# Patient Record
Sex: Female | Born: 1965 | Race: Black or African American | Hispanic: No | Marital: Single | Smoking: Never smoker
Health system: Southern US, Community
[De-identification: ages and names within clinical notes are randomized; demographics above are authoritative.]

## PROBLEM LIST (undated history)

## (undated) DIAGNOSIS — G43909 Migraine, unspecified, not intractable, without status migrainosus: Secondary | ICD-10-CM

## (undated) DIAGNOSIS — M7989 Other specified soft tissue disorders: Secondary | ICD-10-CM

## (undated) DIAGNOSIS — K625 Hemorrhage of anus and rectum: Secondary | ICD-10-CM

## (undated) DIAGNOSIS — R0602 Shortness of breath: Secondary | ICD-10-CM

## (undated) DIAGNOSIS — E559 Vitamin D deficiency, unspecified: Secondary | ICD-10-CM

## (undated) DIAGNOSIS — R12 Heartburn: Secondary | ICD-10-CM

## (undated) DIAGNOSIS — I10 Essential (primary) hypertension: Secondary | ICD-10-CM

## (undated) DIAGNOSIS — K760 Fatty (change of) liver, not elsewhere classified: Secondary | ICD-10-CM

## (undated) DIAGNOSIS — J45909 Unspecified asthma, uncomplicated: Secondary | ICD-10-CM

## (undated) DIAGNOSIS — E669 Obesity, unspecified: Secondary | ICD-10-CM

## (undated) DIAGNOSIS — R05 Cough: Secondary | ICD-10-CM

## (undated) DIAGNOSIS — M1990 Unspecified osteoarthritis, unspecified site: Secondary | ICD-10-CM

## (undated) DIAGNOSIS — R059 Cough, unspecified: Secondary | ICD-10-CM

## (undated) DIAGNOSIS — M2550 Pain in unspecified joint: Secondary | ICD-10-CM

## (undated) HISTORY — PX: EYE SURGERY: SHX253

## (undated) HISTORY — DX: Hemorrhage of anus and rectum: K62.5

## (undated) HISTORY — PX: TUBAL LIGATION: SHX77

## (undated) HISTORY — DX: Cough, unspecified: R05.9

## (undated) HISTORY — DX: Unspecified osteoarthritis, unspecified site: M19.90

## (undated) HISTORY — DX: Obesity, unspecified: E66.9

## (undated) HISTORY — PX: OTHER SURGICAL HISTORY: SHX169

## (undated) HISTORY — PX: CATARACT EXTRACTION: SUR2

## (undated) HISTORY — DX: Cough: R05

## (undated) HISTORY — DX: Heartburn: R12

## (undated) HISTORY — DX: Shortness of breath: R06.02

## (undated) HISTORY — DX: Unspecified asthma, uncomplicated: J45.909

## (undated) HISTORY — DX: Fatty (change of) liver, not elsewhere classified: K76.0

## (undated) HISTORY — DX: Other specified soft tissue disorders: M79.89

## (undated) HISTORY — DX: Pain in unspecified joint: M25.50

## (undated) HISTORY — DX: Migraine, unspecified, not intractable, without status migrainosus: G43.909

## (undated) HISTORY — DX: Vitamin D deficiency, unspecified: E55.9

---

## 1999-06-09 HISTORY — PX: OTHER SURGICAL HISTORY: SHX169

## 2000-06-08 DIAGNOSIS — G43909 Migraine, unspecified, not intractable, without status migrainosus: Secondary | ICD-10-CM

## 2000-06-08 HISTORY — DX: Migraine, unspecified, not intractable, without status migrainosus: G43.909

## 2000-06-08 HISTORY — PX: OTHER SURGICAL HISTORY: SHX169

## 2001-06-08 HISTORY — PX: TOTAL ABDOMINAL HYSTERECTOMY W/ BILATERAL SALPINGOOPHORECTOMY: SHX83

## 2001-06-08 HISTORY — PX: CHOLECYSTECTOMY: SHX55

## 2002-02-02 ENCOUNTER — Encounter: Payer: Self-pay | Admitting: General Surgery

## 2002-02-02 ENCOUNTER — Ambulatory Visit (HOSPITAL_COMMUNITY): Admission: RE | Admit: 2002-02-02 | Discharge: 2002-02-02 | Payer: Self-pay | Admitting: General Surgery

## 2002-02-28 ENCOUNTER — Inpatient Hospital Stay (HOSPITAL_COMMUNITY): Admission: RE | Admit: 2002-02-28 | Discharge: 2002-03-04 | Payer: Self-pay | Admitting: General Surgery

## 2002-04-08 ENCOUNTER — Inpatient Hospital Stay (HOSPITAL_COMMUNITY): Admission: RE | Admit: 2002-04-08 | Discharge: 2002-04-11 | Payer: Self-pay | Admitting: General Surgery

## 2002-04-09 ENCOUNTER — Encounter: Payer: Self-pay | Admitting: General Surgery

## 2003-03-28 ENCOUNTER — Ambulatory Visit (HOSPITAL_COMMUNITY): Admission: RE | Admit: 2003-03-28 | Discharge: 2003-03-28 | Payer: Self-pay | Admitting: Family Medicine

## 2003-03-28 ENCOUNTER — Encounter: Payer: Self-pay | Admitting: Family Medicine

## 2003-07-30 ENCOUNTER — Encounter (INDEPENDENT_AMBULATORY_CARE_PROVIDER_SITE_OTHER): Payer: Self-pay | Admitting: *Deleted

## 2003-07-30 ENCOUNTER — Encounter: Admission: RE | Admit: 2003-07-30 | Discharge: 2003-07-30 | Payer: Self-pay | Admitting: General Surgery

## 2004-05-14 ENCOUNTER — Ambulatory Visit: Payer: Self-pay | Admitting: Family Medicine

## 2005-01-28 ENCOUNTER — Ambulatory Visit: Payer: Self-pay | Admitting: Family Medicine

## 2005-05-14 ENCOUNTER — Ambulatory Visit: Payer: Self-pay | Admitting: Family Medicine

## 2005-06-02 ENCOUNTER — Ambulatory Visit: Payer: Self-pay | Admitting: Family Medicine

## 2005-08-19 ENCOUNTER — Emergency Department (HOSPITAL_COMMUNITY): Admission: EM | Admit: 2005-08-19 | Discharge: 2005-08-19 | Payer: Self-pay | Admitting: Emergency Medicine

## 2005-08-20 ENCOUNTER — Ambulatory Visit: Payer: Self-pay | Admitting: Family Medicine

## 2005-11-10 ENCOUNTER — Ambulatory Visit: Payer: Self-pay | Admitting: Family Medicine

## 2006-07-13 ENCOUNTER — Ambulatory Visit: Payer: Self-pay | Admitting: Family Medicine

## 2006-08-05 ENCOUNTER — Encounter: Admission: RE | Admit: 2006-08-05 | Discharge: 2006-08-05 | Payer: Self-pay | Admitting: Family Medicine

## 2006-08-23 ENCOUNTER — Encounter: Payer: Self-pay | Admitting: Family Medicine

## 2006-08-23 LAB — CONVERTED CEMR LAB
BUN: 12 mg/dL (ref 6–23)
Basophils Absolute: 0 10*3/uL (ref 0.0–0.1)
Basophils Relative: 0 % (ref 0–1)
CO2: 25 meq/L (ref 19–32)
Calcium: 8.8 mg/dL (ref 8.4–10.5)
Chloride: 105 meq/L (ref 96–112)
Cholesterol: 160 mg/dL (ref 0–200)
Creatinine, Ser: 0.78 mg/dL (ref 0.40–1.20)
Eosinophils Absolute: 0.1 10*3/uL (ref 0.0–0.7)
Eosinophils Relative: 1 % (ref 0–5)
Glucose, Bld: 82 mg/dL (ref 70–99)
HCT: 38.8 % (ref 36.0–46.0)
HDL: 44 mg/dL (ref >39)
Hemoglobin: 11.3 g/dL — ABNORMAL LOW (ref 12.0–15.0)
LDL Cholesterol: 101 mg/dL — ABNORMAL HIGH (ref 0–99)
Lymphocytes Relative: 43 % (ref 12–46)
Lymphs Abs: 3.2 10*3/uL (ref 0.7–3.3)
MCHC: 29.1 g/dL — ABNORMAL LOW (ref 30.0–36.0)
MCV: 79.2 fL (ref 78.0–100.0)
Monocytes Absolute: 0.5 10*3/uL (ref 0.2–0.7)
Monocytes Relative: 7 % (ref 3–11)
Neutro Abs: 3.7 10*3/uL (ref 1.7–7.7)
Neutrophils Relative %: 50 % (ref 43–77)
Platelets: 347 10*3/uL (ref 150–400)
Potassium: 3.9 meq/L (ref 3.5–5.3)
RBC: 4.9 M/uL (ref 3.87–5.11)
RDW: 14.6 % — ABNORMAL HIGH (ref 11.5–14.0)
Sodium: 139 meq/L (ref 135–145)
TSH: 1.49 microintl units/mL (ref 0.350–5.50)
Total CHOL/HDL Ratio: 3.6
Triglycerides: 77 mg/dL (ref <150)
VLDL: 15 mg/dL (ref 0–40)
WBC: 7.4 10*3/uL (ref 4.0–10.5)

## 2006-08-26 ENCOUNTER — Ambulatory Visit: Payer: Self-pay | Admitting: Family Medicine

## 2006-08-26 ENCOUNTER — Other Ambulatory Visit: Admission: RE | Admit: 2006-08-26 | Discharge: 2006-08-26 | Payer: Self-pay | Admitting: Family Medicine

## 2006-08-26 ENCOUNTER — Encounter (INDEPENDENT_AMBULATORY_CARE_PROVIDER_SITE_OTHER): Payer: Self-pay | Admitting: *Deleted

## 2006-08-26 LAB — CONVERTED CEMR LAB
Pap Smear: NORMAL
Pap Smear: NORMAL

## 2006-08-27 ENCOUNTER — Encounter: Payer: Self-pay | Admitting: Family Medicine

## 2006-08-27 LAB — CONVERTED CEMR LAB
Candida species: NEGATIVE
Chlamydia, DNA Probe: NEGATIVE
GC Probe Amp, Genital: NEGATIVE
Gardnerella vaginalis: NEGATIVE
Trichomonal Vaginitis: NEGATIVE

## 2006-09-08 ENCOUNTER — Ambulatory Visit: Payer: Self-pay | Admitting: Family Medicine

## 2006-11-22 ENCOUNTER — Ambulatory Visit: Payer: Self-pay | Admitting: Family Medicine

## 2007-01-11 ENCOUNTER — Ambulatory Visit: Payer: Self-pay | Admitting: Family Medicine

## 2007-05-17 ENCOUNTER — Ambulatory Visit: Payer: Self-pay | Admitting: Family Medicine

## 2007-05-18 ENCOUNTER — Ambulatory Visit (HOSPITAL_COMMUNITY): Admission: RE | Admit: 2007-05-18 | Discharge: 2007-05-18 | Payer: Self-pay | Admitting: Family Medicine

## 2007-06-24 ENCOUNTER — Ambulatory Visit: Payer: Self-pay | Admitting: Family Medicine

## 2007-08-16 ENCOUNTER — Encounter: Admission: RE | Admit: 2007-08-16 | Discharge: 2007-08-16 | Payer: Self-pay | Admitting: Family Medicine

## 2007-09-12 ENCOUNTER — Encounter (INDEPENDENT_AMBULATORY_CARE_PROVIDER_SITE_OTHER): Payer: Self-pay | Admitting: *Deleted

## 2007-09-13 DIAGNOSIS — Z8719 Personal history of other diseases of the digestive system: Secondary | ICD-10-CM | POA: Insufficient documentation

## 2007-09-13 DIAGNOSIS — G43909 Migraine, unspecified, not intractable, without status migrainosus: Secondary | ICD-10-CM | POA: Insufficient documentation

## 2007-10-26 ENCOUNTER — Ambulatory Visit: Payer: Self-pay | Admitting: Family Medicine

## 2007-10-26 ENCOUNTER — Other Ambulatory Visit: Admission: RE | Admit: 2007-10-26 | Discharge: 2007-10-26 | Payer: Self-pay | Admitting: Family Medicine

## 2008-01-06 ENCOUNTER — Encounter: Payer: Self-pay | Admitting: Family Medicine

## 2008-01-06 ENCOUNTER — Ambulatory Visit: Payer: Self-pay | Admitting: Family Medicine

## 2008-01-06 DIAGNOSIS — M25519 Pain in unspecified shoulder: Secondary | ICD-10-CM | POA: Insufficient documentation

## 2008-01-10 ENCOUNTER — Ambulatory Visit (HOSPITAL_COMMUNITY): Admission: RE | Admit: 2008-01-10 | Discharge: 2008-01-10 | Payer: Self-pay | Admitting: Family Medicine

## 2008-01-20 ENCOUNTER — Telehealth: Payer: Self-pay | Admitting: Family Medicine

## 2008-05-10 ENCOUNTER — Ambulatory Visit: Payer: Self-pay | Admitting: Family Medicine

## 2008-05-10 LAB — CONVERTED CEMR LAB
Bilirubin Urine: NEGATIVE
Glucose, Urine, Semiquant: NEGATIVE
Ketones, urine, test strip: NEGATIVE
Nitrite: NEGATIVE
Protein, U semiquant: NEGATIVE
Specific Gravity, Urine: 1.025
Urobilinogen, UA: 0.2
WBC Urine, dipstick: NEGATIVE
pH: 6

## 2008-05-11 ENCOUNTER — Telehealth: Payer: Self-pay | Admitting: Family Medicine

## 2008-08-31 ENCOUNTER — Encounter: Payer: Self-pay | Admitting: Family Medicine

## 2008-10-15 ENCOUNTER — Ambulatory Visit (HOSPITAL_COMMUNITY): Admission: RE | Admit: 2008-10-15 | Discharge: 2008-10-15 | Payer: Self-pay | Admitting: Family Medicine

## 2008-10-15 ENCOUNTER — Encounter (INDEPENDENT_AMBULATORY_CARE_PROVIDER_SITE_OTHER): Payer: Self-pay

## 2008-10-15 ENCOUNTER — Ambulatory Visit: Payer: Self-pay | Admitting: Family Medicine

## 2008-10-15 DIAGNOSIS — R51 Headache: Secondary | ICD-10-CM | POA: Insufficient documentation

## 2008-10-15 DIAGNOSIS — R519 Headache, unspecified: Secondary | ICD-10-CM | POA: Insufficient documentation

## 2008-10-17 ENCOUNTER — Encounter: Payer: Self-pay | Admitting: Family Medicine

## 2009-02-28 ENCOUNTER — Encounter: Admission: RE | Admit: 2009-02-28 | Discharge: 2009-02-28 | Payer: Self-pay | Admitting: Family Medicine

## 2009-02-28 ENCOUNTER — Ambulatory Visit: Payer: Self-pay | Admitting: Family Medicine

## 2009-02-28 DIAGNOSIS — R0609 Other forms of dyspnea: Secondary | ICD-10-CM | POA: Insufficient documentation

## 2009-02-28 DIAGNOSIS — R0989 Other specified symptoms and signs involving the circulatory and respiratory systems: Secondary | ICD-10-CM | POA: Insufficient documentation

## 2009-03-07 ENCOUNTER — Ambulatory Visit (HOSPITAL_COMMUNITY): Admission: RE | Admit: 2009-03-07 | Discharge: 2009-03-07 | Payer: Self-pay | Admitting: Family Medicine

## 2009-03-07 ENCOUNTER — Encounter: Payer: Self-pay | Admitting: Family Medicine

## 2009-03-09 ENCOUNTER — Encounter: Payer: Self-pay | Admitting: Family Medicine

## 2009-03-11 ENCOUNTER — Encounter: Payer: Self-pay | Admitting: Family Medicine

## 2009-03-11 LAB — CONVERTED CEMR LAB: Retic Ct Pct: 0.9 % (ref 0.4–3.1)

## 2009-03-12 LAB — CONVERTED CEMR LAB
BUN: 12 mg/dL (ref 6–23)
Basophils Absolute: 0 10*3/uL (ref 0.0–0.1)
Basophils Relative: 0 % (ref 0–1)
CO2: 24 meq/L (ref 19–32)
Calcium: 8.9 mg/dL (ref 8.4–10.5)
Chloride: 104 meq/L (ref 96–112)
Cholesterol: 169 mg/dL (ref 0–200)
Creatinine, Ser: 0.75 mg/dL (ref 0.40–1.20)
Eosinophils Absolute: 0 10*3/uL (ref 0.0–0.7)
Eosinophils Relative: 1 % (ref 0–5)
Glucose, Bld: 81 mg/dL (ref 70–99)
HCT: 36.9 % (ref 36.0–46.0)
HDL: 46 mg/dL (ref >39)
Hemoglobin: 11.1 g/dL — ABNORMAL LOW (ref 12.0–15.0)
LDL Cholesterol: 105 mg/dL — ABNORMAL HIGH (ref 0–99)
Lymphocytes Relative: 43 % (ref 12–46)
Lymphs Abs: 2.4 10*3/uL (ref 0.7–4.0)
MCHC: 30.1 g/dL (ref 30.0–36.0)
MCV: 75.2 fL — ABNORMAL LOW (ref 78.0–100.0)
Monocytes Absolute: 0.5 10*3/uL (ref 0.1–1.0)
Monocytes Relative: 9 % (ref 3–12)
Neutro Abs: 2.6 10*3/uL (ref 1.7–7.7)
Neutrophils Relative %: 47 % (ref 43–77)
Platelets: 360 10*3/uL (ref 150–400)
Potassium: 4.1 meq/L (ref 3.5–5.3)
RBC: 4.91 M/uL (ref 3.87–5.11)
RDW: 14.3 % (ref 11.5–15.5)
Sodium: 139 meq/L (ref 135–145)
TSH: 1.02 microintl units/mL (ref 0.350–4.500)
Total CHOL/HDL Ratio: 3.7
Triglycerides: 88 mg/dL (ref <150)
VLDL: 18 mg/dL (ref 0–40)
WBC: 5.6 10*3/uL (ref 4.0–10.5)

## 2009-04-11 ENCOUNTER — Encounter: Payer: Self-pay | Admitting: Family Medicine

## 2009-04-22 ENCOUNTER — Encounter: Payer: Self-pay | Admitting: Family Medicine

## 2009-04-26 ENCOUNTER — Telehealth: Payer: Self-pay | Admitting: Family Medicine

## 2009-04-30 ENCOUNTER — Ambulatory Visit: Payer: Self-pay | Admitting: Family Medicine

## 2009-04-30 DIAGNOSIS — R112 Nausea with vomiting, unspecified: Secondary | ICD-10-CM | POA: Insufficient documentation

## 2009-09-02 ENCOUNTER — Ambulatory Visit: Payer: Self-pay | Admitting: Family Medicine

## 2009-09-02 ENCOUNTER — Encounter (INDEPENDENT_AMBULATORY_CARE_PROVIDER_SITE_OTHER): Payer: Self-pay

## 2009-11-21 ENCOUNTER — Ambulatory Visit: Payer: Self-pay | Admitting: Family Medicine

## 2009-11-29 DIAGNOSIS — M5410 Radiculopathy, site unspecified: Secondary | ICD-10-CM | POA: Insufficient documentation

## 2009-11-29 DIAGNOSIS — M549 Dorsalgia, unspecified: Secondary | ICD-10-CM

## 2010-03-13 ENCOUNTER — Ambulatory Visit: Payer: Self-pay | Admitting: Family Medicine

## 2010-03-13 ENCOUNTER — Encounter (INDEPENDENT_AMBULATORY_CARE_PROVIDER_SITE_OTHER): Payer: Self-pay

## 2010-05-20 ENCOUNTER — Encounter: Payer: Self-pay | Admitting: Family Medicine

## 2010-05-29 ENCOUNTER — Other Ambulatory Visit
Admission: RE | Admit: 2010-05-29 | Discharge: 2010-05-29 | Payer: Self-pay | Source: Home / Self Care | Admitting: Family Medicine

## 2010-05-29 ENCOUNTER — Ambulatory Visit: Payer: Self-pay | Admitting: Family Medicine

## 2010-05-29 LAB — CONVERTED CEMR LAB: OCCULT 1: NEGATIVE

## 2010-05-30 ENCOUNTER — Encounter: Payer: Self-pay | Admitting: Family Medicine

## 2010-06-02 DIAGNOSIS — S139XXA Sprain of joints and ligaments of unspecified parts of neck, initial encounter: Secondary | ICD-10-CM | POA: Insufficient documentation

## 2010-06-02 DIAGNOSIS — R062 Wheezing: Secondary | ICD-10-CM | POA: Insufficient documentation

## 2010-06-05 ENCOUNTER — Encounter: Payer: Self-pay | Admitting: Family Medicine

## 2010-06-05 LAB — CONVERTED CEMR LAB: Pap Smear: NEGATIVE

## 2010-06-06 LAB — HM MAMMOGRAPHY

## 2010-06-06 LAB — HM PAP SMEAR

## 2010-06-16 ENCOUNTER — Encounter: Payer: Self-pay | Admitting: Family Medicine

## 2010-06-23 ENCOUNTER — Telehealth: Payer: Self-pay | Admitting: Family Medicine

## 2010-06-23 ENCOUNTER — Encounter: Payer: Self-pay | Admitting: Family Medicine

## 2010-06-24 ENCOUNTER — Ambulatory Visit
Admission: RE | Admit: 2010-06-24 | Discharge: 2010-06-24 | Payer: Self-pay | Source: Home / Self Care | Attending: Family Medicine | Admitting: Family Medicine

## 2010-06-29 ENCOUNTER — Encounter: Payer: Self-pay | Admitting: Family Medicine

## 2010-06-29 ENCOUNTER — Encounter: Payer: Self-pay | Admitting: Neurology

## 2010-07-09 ENCOUNTER — Encounter: Payer: Self-pay | Admitting: Family Medicine

## 2010-07-10 NOTE — Letter (Signed)
Summary: Work Excuse  [facility]  [address]   [place], [place] [address]   Phone: [phone]  Fax: [phone]    Today's Date: November 21, 2009  Name of Patient: Shannon Roth  The above named patient had a medical visit today.  Please take this into consideration when reviewing the time away from work/school.    Special Instructions:  [ * ] None  [  ] To be off the remainder of today, returning to the normal work / school schedule tomorrow.  [  ] To be off until the next scheduled appointment on ______________________.  [  ] Other ________________________________________________________________ ________________________________________________________________________   Sincerely yours,   Syliva Overman, MD

## 2010-07-10 NOTE — Letter (Signed)
Summary: [facility]  [facility]   Imported By: Lind Guest 05/30/2010 11:20:45  _____________________________________________________________________  External Attachment:    Type:   Image     Comment:   External Document

## 2010-07-10 NOTE — Progress Notes (Signed)
Summary: TEST RESULTS  Phone Note Call from Patient   Summary of Call: WANTS TO KNOW RESULTS FROM TESTS CALL HER BACK Initial call taken by: Lind Guest,  January 20, 2008 9:31 AM  Follow-up for Phone Call        left messege for pt call back.  will let pt  know  ultrasound and xray are both normal at that time Follow-up by: Calvert Cantor,  January 20, 2008 2:15 PM  Additional Follow-up for Phone Call Additional follow up Details #1::        noted Additional Follow-up by: Syliva Overman MD,  January 24, 2008 8:20 AM

## 2010-07-10 NOTE — Progress Notes (Signed)
Summary: DR. Ninetta Lights  DR. Ninetta Lights   Imported By: Lind Guest 03/01/2009 11:16:24  _____________________________________________________________________  External Attachment:    Type:   Image     Comment:   External Document

## 2010-07-10 NOTE — Assessment & Plan Note (Signed)
Vital Signs:  Patient Profile:   45 Years Old Female Height:     67.25 inches Weight:      303.7 pounds BMI:     47.38 Pulse rate:   72 / minute Resp:     16 per minute BP sitting:   136 / 78  (right arm)  Pt. in pain?   no  Vitals Entered By: Chipper Herb (January 06, 2008 8:45 AM)                  Chief Complaint:  c/o left shoulder "movement" X 3 weeks.  History of Present Illness: Patient presents with a 3 week h/o of a sensation of something moving in her L shoulder like a worm or some other foreign object. There is no h/o insect bite, warmth or tenderness in the affected area.She has had no fever or chills. The patient does report stress on the job, but denies insomnia, suicidal or homicidal ideation.    Current Allergies: ! PCN     Review of Systems  ENT      Denies hoarseness, nasal congestion, sinus pressure, and sore throat.  CV      Denies chest pain or discomfort, near fainting, palpitations, shortness of breath with exertion, and swelling of feet.  Resp      Complains of cough.      Denies shortness of breath, sputum productive, and wheezing.  GI      Denies abdominal pain, constipation, diarrhea, nausea, and vomiting.  GU      Denies dysuria, incontinence, and urinary frequency.  MS      Denies joint pain and stiffness.   Physical Exam  General:     overweight-appearing.   Head:     Normocephalic and atraumatic without obvious abnormalities. No apparent alopecia or balding. Eyes:     vision grossly intact.   Ears:     R ear normal and L ear normal.   Nose:     External nasal examination shows no deformity or inflammation. Nasal mucosa are pink and moist without lesions or exudates. Mouth:     Oral mucosa and oropharynx without lesions or exudates.  Teeth in good repair. Neck:     No deformities, masses, or tenderness noted. Chest Wall:     No deformities, masses, or tenderness noted. Lungs:     Normal respiratory effort,  chest expands symmetrically. Lungs are clear to auscultation, no crackles or wheezes. Heart:     Normal rate and regular rhythm. S1 and S2 normal without gallop, murmur, click, rub or other extra sounds. Abdomen:     soft, non-tender, and normal bowel sounds.   Msk:     No deformity or scoliosis noted of thoracic or lumbar spine.   Neurologic:     alert & oriented X3, cranial nerves II-XII intact, strength normal in all extremities, and sensation intact to light touch.   Skin:     Intact without suspicious lesions or rashes No palpable mass on L shoulder. Psych:     Cognition and judgment appear intact. Alert and cooperative with normal attention span and concentration. No apparent delusions, illusions, hallucinations    Impression & Recommendations:  Problem # 1:  MIGRAINE HEADACHE (ICD-346.90) Assessment: Improved  Problem # 2:  OBESITY, UNSPECIFIED (ICD-278.00) Assessment: Improved  Problem # 3:  SHOULDER PAIN, LEFT (ICD-719.41)  Her updated medication list for this problem includes:    Flexeril 10 Mg Tabs (Cyclobenzaprine hcl) ..... One half to one  tab by mouth three times a day as needed  Patient to have [place] of L shoulder to eval. abn . sensation and R/O foreign body.   Problem # 4:  COUGH (ICD-786.2) Assessment: Unchanged Patient to have CXR and a trial of symbicort.  Complete Medication List: 1)  Flexeril 10 Mg Tabs (Cyclobenzaprine hcl) .... One half to one tab by mouth three times a day as needed 2)  Topamax 200 Mg Tabs (Topiramate) .... One tab by mouth at bedtime 3)  Symbicort 80-4.5 Mcg/act Aero (Budesonide-formoterol fumarate) .... Inhale two puffs twice daily   Patient Instructions: 1)  Please schedule a follow-up appointment in 2 months. 2)  It is important that you exercise regularly at least 20 minutes 5 times a week. If you develop chest pain, have severe difficulty breathing, or feel very tired , stop exercising immediately and seek medical attention.  3)  You need to lose weight. Consider a lower calorie diet and regular exercise.  4)  Please STOP EATING  SNACKS, CAKES, PIES AND ICECREAM. YOU WANT TO WEAR YOUR NICE CLOTHES. 5)  YOu WILL START AN INHALER SYMBICORT 2 PUFFS TWICE DAILY FOR YOUR COUGH. 6)  You will have a CXR and an [place] of R shoulder for your symptoms. 7)  Medically cleared to return to work; return to work note provided.   Prescriptions: SYMBICORT 80-4.5 MCG/ACT  AERO (BUDESONIDE-FORMOTEROL FUMARATE) Inhale two puffs twice daily  #1 mth x 3   Entered by:   Chipper Herb   Authorized by:   Syliva Overman MD   Signed by:   Chipper Herb on 01/06/2008   Method used:   Handwritten   RxID:   [id number]  ]

## 2010-07-10 NOTE — Progress Notes (Signed)
Summary: RX  Phone Note Call from Patient   Summary of Call: RX WAS NOT SENT [facility] [place] THEY HAVE NOT RECIEVED ANYTHING Initial call taken by: Lind Guest,  May 11, 2008 11:20 AM  Follow-up for Phone Call        Phone Call Completed, Rx Called In Follow-up by: Worthy Keeler LPN,  May 11, 2008 2:37 PM

## 2010-07-10 NOTE — Letter (Signed)
Summary: Out of Work  [facility]  [address]   [place], [place] [address]   Phone: [phone]  Fax: [phone]    June 23, 2010   Employee:  ADRI SCHLOSS Milosevic    To Whom It May Concern:   For Medical reasons, please excuse the above named employee from work for the following dates:  Start:   06/24/10  End:   06/25/10 to return with no restrictions  If you need additional information, please feel free to contact our office.         Sincerely,    Milus Mallick. Lodema Hong, MD

## 2010-07-10 NOTE — Assessment & Plan Note (Signed)
Summary: per dr  Nurse Visit   Vital Signs:  Patient profile:   45 year old female Menstrual status:  hysterectomy Weight:      300 pounds BP sitting:   126 / 84  Vitals Entered By: Adella Hare LPN (June 24, 2010 11:43 AM) CC: migraine with nausea   Allergies: 1)  ! Pcn 2)  ! Codeine  Medication Administration  Injection # 1:    Medication: Depo- Medrol 80mg     Diagnosis: MIGRAINE HEADACHE (ICD-346.90)    Route: IM    Site: RUOQ gluteus    Exp Date: 07/12    Lot #: [id number]    Mfr: Pharmacia    Patient tolerated injection without complications    Given by: Adella Hare LPN (June 24, 2010 11:43 AM)  Injection # 2:    Medication: Ketorolac-Toradol 15mg     Diagnosis: MIGRAINE HEADACHE (ICD-346.90)    Route: IM    Site: RUOQ gluteus    Exp Date: 11/07/2011    Lot #: [id number]    Mfr: NOVAPLUS    Comments: TORADOL 60MG  GIVEN    Patient tolerated injection without complications    Given by: Adella Hare LPN (June 24, 2010 11:44 AM)  Injection # 3:    Medication: Zofran 1mg . injection    Diagnosis: NAUSEA (ICD-787.02)    Route: IM    Site: LUOQ gluteus    Exp Date: 04/13    Lot #: [id number]    Mfr: NOVAPLUS    Comments: ZOFRAN 4MG  GIVEN    Patient tolerated injection without complications    Given by: Adella Hare LPN (June 24, 2010 11:45 AM)  Orders Added: 1)  Depo- Medrol 80mg  [J1040] 2)  Ketorolac-Toradol 15mg  [J1885] 3)  Zofran 1mg . injection [J2405] 4)  Admin of Therapeutic Inj  intramuscular or subcutaneous [96372]  tolerated injectionand work excuse provided  Medication Administration  Injection # 1:    Medication: Depo- Medrol 80mg     Diagnosis: MIGRAINE HEADACHE (ICD-346.90)    Route: IM    Site: RUOQ gluteus    Exp Date: 07/12    Lot #: Gunnar [id number]    Mfr: Pharmacia    Patient tolerated injection without complications    Given by: Adella Hare LPN (June 24, 2010 11:43 AM)  Injection # 2:    Medication: Ketorolac-Toradol 15mg   Diagnosis: MIGRAINE HEADACHE (ICD-346.90)    Route: IM    Site: RUOQ gluteus    Exp Date: 11/07/2011    Lot #: [id number]    Mfr: NOVAPLUS    Comments: TORADOL 60MG  GIVEN    Patient tolerated injection without complications    Given by: Adella Hare LPN (June 24, 2010 11:44 AM)  Injection # 3:    Medication: Zofran 1mg . injection    Diagnosis: NAUSEA (ICD-787.02)    Route: IM    Site: LUOQ gluteus    Exp Date: 04/13    Lot #: [id number]    Mfr: NOVAPLUS    Comments: ZOFRAN 4MG  GIVEN    Patient tolerated injection without complications    Given by: Adella Hare LPN (June 24, 2010 11:45 AM)  Orders Added: 1)  Depo- Medrol 80mg  [J1040] 2)  Ketorolac-Toradol 15mg  [J1885] 3)  Zofran 1mg . injection [J2405] 4)  Admin of Therapeutic Inj  intramuscular or subcutaneous [30865]

## 2010-07-10 NOTE — Progress Notes (Signed)
[facility]   Imported By: Lind Guest 03/11/2009 11:34:11  _____________________________________________________________________  External Attachment:    Type:   Image     Comment:   External Document

## 2010-07-10 NOTE — Letter (Signed)
Summary: Out of Work  [facility]  [address]   [place], [place] [address]   Phone: [phone]  Fax: [phone]    March 13, 2010   Employee:  Lonia Mad Cheetham    To Whom It May Concern:   For Medical reasons, please excuse the above named employee from work for the following dates:  Start:   03/12/2010  End:   03/14/2010 To return with no restrictions  If you need additional information, please feel free to contact our office.         Sincerely,    Esperanza Sheets, PA-C

## 2010-07-10 NOTE — Letter (Signed)
Summary: Out of Work  [facility]  [address]   [place], [place] [address]   Phone: [phone]  Fax: [phone]    Oct 15, 2008   Employee:  Shannon Roth    To Whom It May Concern:   For Medical reasons, please excuse the above named employee from work for the following dates:  Start:   10/15/2008  End:   10/17/2008  If you need additional information, please feel free to contact our office.         Sincerely,    Everitt Amber

## 2010-07-10 NOTE — Assessment & Plan Note (Signed)
Summary: OV   Vital Signs:  Patient profile:   45 year old female Menstrual status:  hysterectomy Height:      67.5 inches (171.45 cm) Weight:      296 pounds (134.55 kg) BMI:     45.84 BSA:     2.40 O2 Sat:      98 % Pulse rate:   66 / minute Resp:     16 per minute BP sitting:   118 / 80  (left arm) Cuff size:   largex  Vitals Entered By: Everitt Amber (Oct 15, 2008 1:10 PM)  Nutrition Counseling: Patient's BMI is greater than 25 and therefore counseled on weight management options. CC: has a tremendous headache from hitting her head saturday. It began hurting later on after she hit it and the whole right side of her face appears to be swollen and she is experiencing alot of pressure int he area Pain Assessment Patient in pain? yes     Location: right side of head Intensity: 10 Type: throbbing Onset of pain  Saturday evening. Hurts to even touch head  years   days  Menstrual Status hysterectomy Last PAP Result Normal   CC:  has a tremendous headache from hitting her head saturday. It began hurting later on after she hit it and the whole right side of her face appears to be swollen and she is experiencing alot of pressure int he area.  History of Present Illness: headache , right facial swelling , inability to completely open right eye x 3days after direct trauma to crown of head. No respone  to typical meds. Rated at 10plus, only localised weakness is of the r eye, and moving her head hurts. Symptoms have progressively worsened and this is her first visit to the doc.This does not seem to be like her typical migraine.she denies fever , chills or neck stiffness, but movement of the head is painful. other than the right eye she has no localised weakness.She denies sensory deficit.  Current Medications (verified): 1)  Flexeril 10 Mg  Tabs (Cyclobenzaprine Hcl) .... One Half To One Tab By Mouth Three Times A Day As Needed 2)  Topamax 200 Mg  Tabs (Topiramate) .... One Tab By  Mouth At Bedtime 3)  Symbicort 80-4.5 Mcg/act  Aero (Budesonide-Formoterol Fumarate) .... Inhale Two Puffs Twice Daily  Allergies (verified): 1)  ! Pcn 2)  ! Codeine  Review of Systems General:  Complains of fatigue, sleep disorder, and weakness; denies chills and fever; unable to get adequate sleep since headache onset. ENT:  Denies hoarseness, nasal congestion, sinus pressure, and sore throat. CV:  Denies chest pain or discomfort, palpitations, shortness of breath with exertion, and swelling of feet. Resp:  Denies cough, sputum productive, and wheezing. GI:  Denies abdominal pain, constipation, diarrhea, nausea, and vomiting. GU:  Denies dysuria and urinary frequency. MS:  Denies joint pain and stiffness. Neuro:  See HPI; Complains of headaches. Psych:  Denies anxiety and depression.  Physical Exam  General:  obese female, tearful and in pain, well hydrated in no c/P distress. HEENT: No facial asymmetry,  EOMI, No sinus tenderness, TM's Clear, oropharynx  pink and moist. right maxillary area swollen and tender  Chest: Clear to auscultation bilaterally.  CVS: S1, S2, No murmurs, No S3.   Abd: Soft, Nontender.  MS: Adequate ROM spine, hips, shoulders and knees.  Ext: No edema.   CNS: CN 2-12 intact, power tone and sensation normal throughout.Weakness of right upper lid noted  Skin: Intact, no visible lesions or rashes.  Psych:memory intact.anxious , tearful, at one time patient was hollering loudly in the office stating she was in pain.   Impression & Recommendations:  Problem # 1:  HEADACHE (ICD-784.0) Assessment Comment Only  Orders: Radiology Referral (Radiology), pt was too claustrophobic to obtain the mRI ordered, so she had this changed to a cT scan which was negative for acute abnormality, she is referred for urgent heurological eval and work note proveded to be excused Neurology Referral (Neuro) Depo- Medrol 80mg  (J1040) Ketorolac-Toradol 15mg  (Z6109) Admin of  Therapeutic Inj  intramuscular or subcutaneous (60454)  Problem # 2:  OBESITY, UNSPECIFIED (ICD-278.00) Assessment: Deteriorated  Ht: 67.5 (10/15/2008)   Wt: 296 (10/15/2008)   BMI: 45.84 (10/15/2008)  Complete Medication List: 1)  Flexeril 10 Mg Tabs (Cyclobenzaprine hcl) .... One half to one tab by mouth three times a day as needed 2)  Topamax 200 Mg Tabs (Topiramate) .... One tab by mouth at bedtime 3)  Symbicort 80-4.5 Mcg/act Aero (Budesonide-formoterol fumarate) .... Inhale two puffs twice daily  Patient Instructions: 1)  You  need a brain scan to determine the cause of the headache and also a referral to a neurologist as soon as possible.It is very impt that you keep both of these referrals.  2)  If yoour scan shows no bleeding in your brain, you can receive 2 of your normal pain shots in the office this afternoon.   Medication Administration  Injection # 1:    Medication: Depo- Medrol 80mg     Diagnosis: HEADACHE (ICD-784.0)    Route: IM    Site: RUOQ gluteus    Exp Date: 12/2010    Lot #: [id number]    Mfr: [email or web address]    Comments: 80 mg given    Patient tolerated injection without complications    Given by: Everitt Amber (Oct 15, 2008 4:00 PM)  Injection # 2:    Medication: Ketorolac-Toradol 15mg     Diagnosis: HEADACHE (ICD-784.0)    Route: IM    Site: RUOQ gluteus    Exp Date: 07/2010    Lot #: [id number]    Mfr: Novaplus    Comments: 60 mg given    Patient tolerated injection without complications    Given by: Everitt Amber (Oct 15, 2008 4:01 PM)  Orders Added: 1)  Est. Patient Level III [09811] 2)  Radiology Referral [Radiology] 3)  Neurology Referral [Neuro] 4)  Depo- Medrol 80mg  [J1040] 5)  Ketorolac-Toradol 15mg  [J1885] 6)  Admin of Therapeutic Inj  intramuscular or subcutaneous [96372] 7)  Est. Patient Level IV [91478]   Appended Document: OV pls recode at a level 4 and remove level 3  Appended Document: OV this will be recoded

## 2010-07-10 NOTE — Letter (Signed)
Summary: Pap Smear, Normal Letter, [facility]  [address]   [place], [place] [address]   Phone: [phone]  Fax: [phone]          June 05, 2010    Dear: Shannon Roth    I am pleased to notify you that your PAP smear was normal.  You will need your next PAP smear in:     ____ 3 Months    ____ 6 Months    ____ 12 Months    Please call the office at our office number above, to schedule your next appointment.    Sincerely,     [facility]

## 2010-07-10 NOTE — Assessment & Plan Note (Signed)
Summary: HEADACHE   Vital Signs:  Patient profile:   45 year old female Menstrual status:  hysterectomy Height:      67.5 inches Weight:      307.25 pounds BMI:     47.58 O2 Sat:      97 % Pulse rate:   64 / minute Pulse rhythm:   regular Resp:     16 per minute BP sitting:   120 / 80  (left arm) Cuff size:   xl  Vitals Entered By: Everitt Amber LPN (September 02, 2009 11:21 AM)  Nutrition Counseling: Patient's BMI is greater than 25 and therefore counseled on weight management options. CC: having a migraine since saturday morning Pain Assessment Patient in pain? yes     Location: headache Intensity: 10 Type: pounding Onset of pain  saturday    Primary Care Provider:  Syliva Overman MD  CC:  having a migraine since saturday morning.  History of Present Illness: 3 day h/o frontal pounding headache which started after an arguement with one of her siblings. She staesthe pain is a 10, it is throbbing and accompanied by nausea.ShE nearly went to the Ed yesterday because of her symptoms, she has no triptans taking aT THIS TIME. tHIS IS THE FIRST HEADACHE SINCE dECEMBER. sHEDENIES FEVER CHILLS, SINUS PRESSURE,NASAL CONGESTION, SORE THROAT OR PRODUCTIVE COUGH. She reports frustration with her family often, staes her work situation has improved, and denies depression or anxiety. she has not maintained lifestyle changes o improve her health or promote weight loss.   Allergies: 1)  ! Pcn 2)  ! Codeine  Review of Systems      See HPI General:  Complains of fatigue; denies chills and fever. Eyes:  Denies blurring and discharge. CV:  Denies chest pain or discomfort, palpitations, and swelling of feet. GI:  Complains of nausea; denies abdominal pain, change in bowel habits, constipation, diarrhea, and vomiting. GU:  Denies dysuria and urinary frequency. MS:  Complains of joint pain. Neuro:  Complains of headaches; denies poor balance and seizures; pty has a 3 day h/o frontal  throbbing headache following a conflic with her sibling, shhe also has nausea. First headache since december, was goingto the ed because of uncontrolled pain and nause. Endo:  Denies excessive hunger, excessive thirst, and excessive urination. Heme:  Denies bleeding. Allergy:  Complains of seasonal allergies; denies hives or rash.  Physical Exam  General:  Well-developed,obedse no acute distress; alert,appropriate and cooperative throughout examination HEENT: No facial asymmetry,  EOMI, No sinus tenderness, TM's Clear, oropharynx  pink and moist.   Chest: Clear to auscultation bilaterally.  CVS: S1, S2, No murmurs, No S3.   Abd: Soft, Nontender.  MS: Adequate ROM spine, hips, shoulders and knees.  Ext: No edema.   CNS: CN 2-12 intact, power tone and sensation normal throughout.   Skin: Intact, no visible lesions or rashes.  Psych: Good eye contact, normal affect.  Memory intact, not anxious or depressed appearing.    Impression & Recommendations:  Problem # 1:  NAUSEA (ICD-787.02) Assessment Comment Only  Orders: Zofran 1mg . injection (J4782)  Problem # 2:  HEADACHE (ICD-784.0) Assessment: Deteriorated  Her updated medication list for this problem includes:    Ibuprofen 800 Mg Tabs (Ibuprofen) .Marland Kitchen... Take 1 tablet by mouth two times a day as needed for severe headache, maximum two daily max twice weekly    Fioricet 50-325-40 Mg Tabs (Butalbital-apap-caffeine) .Marland Kitchen... Take 1 tablet by mouth two times a day as needed  Maxalt-mlt 10 Mg Tbdp (Rizatriptan benzoate) ..... One at headache onset, may repeat every 2 hours if needed , max 3 tablets in 24 hrs, maximum use is twice weekly  Orders: Ketorolac-Toradol 15mg  (E4540) Depo- Medrol 80mg  (J1040) Admin of Therapeutic Inj  intramuscular or subcutaneous (98119)  Problem # 3:  OBESITY, UNSPECIFIED (ICD-278.00) Assessment: Deteriorated  Ht: 67.5 (09/02/2009)   Wt: 307.25 (09/02/2009)   BMI: 47.58 (09/02/2009)  Complete Medication  List: 1)  Flexeril 10 Mg Tabs (Cyclobenzaprine hcl) .... One half to one tab by mouth three times a day as needed 2)  Symbicort 80-4.5 Mcg/act Aero (Budesonide-formoterol fumarate) .... Inhale two puffs twice daily 3)  Ibuprofen 800 Mg Tabs (Ibuprofen) .... Take 1 tablet by mouth two times a day as needed for severe headache, maximum two daily max twice weekly 4)  Alprazolam 0.25 Mg Tabs (Alprazolam) .... Take 1 tab by mouth at bedtime 5)  Fioricet 50-325-40 Mg Tabs (Butalbital-apap-caffeine) .... Take 1 tablet by mouth two times a day as needed 6)  Maxalt-mlt 10 Mg Tbdp (Rizatriptan benzoate) .... One at headache onset, may repeat every 2 hours if needed , max 3 tablets in 24 hrs, maximum use is twice weekly 7)  Promethazine Hcl 25 Mg Tabs (Promethazine hcl) .... Take 1 tablet by mouth two times a day as needed  Patient Instructions: 1)  Please schedule a follow-up appointment in 2.5 months. 2)  It is important that you exercise regularly at least 20 minutes 5 times a week. If you develop chest pain, have severe difficulty breathing, or feel very tired , stop exercising immediately and seek medical attention. 3)  You need to lose weight. Consider a lower calorie diet and regular exercise.  Prescriptions: PROMETHAZINE HCL 25 MG TABS (PROMETHAZINE HCL) Take 1 tablet by mouth two times a day as needed  #20 x 0   Entered and Authorized by:   Syliva Overman MD   Signed by:   Syliva Overman MD on 09/02/2009   Method used:   Electronically to        [facility] (retail)       [address]       [place], [place]  [address]       Ph: [phone]       Fax: [phone]   RxID:   [id number] MAXALT-MLT 10 MG TBDP (RIZATRIPTAN BENZOATE) one at headache onset, may repeat every 2 hours if needed , max 3 tablets in 24 hrs, maximum use is twice weekly  #10 x 2   Entered and Authorized by:   Syliva Overman MD   Signed by:   Syliva Overman MD on 09/02/2009   Method  used:   Electronically to        [facility] (retail)       [address]       [place], [place]  [address]       Ph: [phone]       Fax: [phone]   RxID:   [id number]    Medication Administration  Injection # 1:    Medication: Ketorolac-Toradol 15mg     Diagnosis: HEADACHE (ICD-784.0)    Route: IM    Site: RUOQ gluteus    Exp Date: 01/2011    Lot #: [id number]    Mfr: [email or web address]    Comments: 60mg  given     Patient tolerated injection  without complications    Given by: Everitt Amber LPN (September 02, 2009 11:36 AM)  Injection # 2:    Medication: Depo- Medrol 80mg     Diagnosis: HEADACHE (ICD-784.0)    Route: IM    Site: LUOQ gluteus    Exp Date: 04/2010    Lot #: [id number]    Mfr: Pharmacia    Comments: 80mg  given     Patient tolerated injection without complications    Given by: Everitt Amber LPN (September 02, 2009 11:37 AM)  Injection # 5:    Medication: Zofran 1mg . injection    Diagnosis: NAUSEA (ICD-787.02)    Route: IM    Site: LUOQ gluteus    Exp Date: 12/2010    Lot #: [id number]    Mfr: [email or web address]    Comments: 4mg  given     Patient tolerated injection without complications    Given by: Everitt Amber LPN (September 02, 2009 11:37 AM)  Orders Added: 1)  Zofran 1mg . injection [J2405] 2)  Ketorolac-Toradol 15mg  [J1885] 3)  Depo- Medrol 80mg  [J1040] 4)  Admin of Therapeutic Inj  intramuscular or subcutaneous [96372] 5)  Est. Patient Level IV [29562]

## 2010-07-10 NOTE — Progress Notes (Signed)
Summary: HEADACHE  Phone Note Call from Patient   Summary of Call: HAS GOT A HEADACHE WANTS TO BE SEEN  SHE HAS TOOK EVERYTHING SHE HAS AND HAS NOT STOPPED YET  CALL BACK AT [phone] Initial call taken by: Lind Guest,  June 23, 2010 1:27 PM  Follow-up for Phone Call        nurse visit in am for injections pt aware, she will come in the morning at 8:30 am she is aware. Headache is her usual miograine will need a note work for tomorrow, headache has been going on since past 3 days Follow-up by: Syliva Overman MD,  June 23, 2010 5:04 PM  Additional Follow-up for Phone Call Additional follow up Details #1::        Pt to have nurse visit only in the morning , nurse pls administer  toradol 60mg  and depomedrol 80mg  im and zofran 4 mg IM for headache and nause in the morning , also needs work excuse for 01/17 to return 06/25/2010 Additional Follow-up by: Syliva Overman MD,  June 23, 2010 5:08 PM    Additional Follow-up for Phone Call Additional follow up Details #2::    noted Follow-up by: Adella Hare LPN,  June 23, 2010 5:12 PM

## 2010-07-10 NOTE — Assessment & Plan Note (Signed)
Summary: OV   Vital Signs:  Patient Profile:   45 Years Old Female Height:     67.25 inches Weight:      295 pounds BMI:     46.03 O2 Sat:      98 % Pulse rate:   69 / minute Resp:     16 per minute BP sitting:   110 / 80  (left arm)  Vitals Entered By: Everitt Amber (May 10, 2008 11:02 AM)                 Chief Complaint:  Follow up, coughing, congested, and took a zyrtec make her sick on stomach.  History of Present Illness: Head congestion, sore swollen throat and cough , chills progressing over the past 4 days. Shehas yellow sputum at times , she ios experiencing low back pain with urinary frequency  She reports disabling back pain and spasm over the past 5 days    Current Allergies: ! PCN ! CODEINE     Review of Systems  General      Complains of chills and fatigue.  ENT      Complains of nasal congestion, postnasal drainage, and sinus pressure.  CV      Denies chest pain or discomfort, palpitations, and swelling of feet.  Resp      Complains of cough, shortness of breath, and sputum productive.  GI      Denies abdominal pain, constipation, diarrhea, nausea, and vomiting.  MS      Denies joint pain and stiffness.  Derm      Denies changes in color of skin, changes in nail beds, dryness, excessive perspiration, flushing, hair loss, insect bite(s), itching, lesion(s), poor wound healing, and rash.  Psych      Denies anxiety and depression.  Endo      Denies cold intolerance, excessive hunger, excessive thirst, excessive urination, heat intolerance, polyuria, and weight change.  Heme      Denies abnormal bruising, bleeding, enlarge lymph nodes, fevers, pallor, and skin discoloration.   Physical Exam  General:     Morbidly obese and in no C/P disress. Head:     Normocephalic and atraumatic without obvious abnormalities. No apparent alopecia or balding.Positive maxillary frontal and maxillary sinus tenderness.  Eyes:     vision grossly  intact.   Ears:     External ear exam shows no significant lesions or deformities.  Otoscopic examination reveals clear canals, tympanic membranes are intact bilaterally without bulging, retraction, inflammation or discharge. Hearing is grossly normal bilaterally. Nose:     no external deformity and no nasal discharge.   Mouth:     fair dentition and teeth missing.   Neck:     No deformities, masses, or tenderness noted. Lungs:     decreased air entry, bilateral crackles Heart:     Normal rate and regular rhythm. S1 and S2 normal without gallop, murmur, click, rub or other extra sounds. Abdomen:     soft and non-tender.   Extremities:     No clubbing, cyanosis, edema, or deformity noted with normal full range of motion of all joints.   Neurologic:     alert & oriented X3, cranial nerves II-XII intact, and strength normal in all extremities.   Skin:     Intact without suspicious lesions or rashes Cervical Nodes:     B cervical adenitis Psych:     Oriented X3, memory intact for recent and remote, and normally interactive.  Impression & Recommendations:  Problem # 1:  ACUTE CYSTITIS (ICD-595.0) Assessment: Comment Only  Her updated medication list for this problem includes:    Septra Ds 800-160 Mg Tabs (Sulfamethoxazole-trimethoprim) .Marland Kitchen... Take 1 tablet by mouth two times a day  Orders: UA Dipstick W/ Micro (manual) (98119) negative, though pt is symptomatic  Encouraged to push clear liquids, get enough rest, and take acetaminophen as needed. To be seen in 10 days if no improvement, sooner if worse.  Orders: UA Dipstick W/ Micro (manual) ([address])   Problem # 2:  ACUTE BRONCHITIS (ICD-466.0) Assessment: Comment Only  Her updated medication list for this problem includes:    Symbicort 80-4.5 Mcg/act Aero (Budesonide-formoterol fumarate) ..... Inhale two puffs twice daily    Septra Ds 800-160 Mg Tabs (Sulfamethoxazole-trimethoprim) .Marland Kitchen... Take 1 tablet by mouth two times  a day    Tessalon Perles 100 Mg Caps (Benzonatate) .Marland Kitchen... Take 1 capsule by mouth three times a day   Problem # 3:  OTHER ACUTE SINUSITIS (ICD-461.8) Assessment: Comment Only  Her updated medication list for this problem includes:    Septra Ds 800-160 Mg Tabs (Sulfamethoxazole-trimethoprim) .Marland Kitchen... Take 1 tablet by mouth two times a day    Tessalon Perles 100 Mg Caps (Benzonatate) .Marland Kitchen... Take 1 capsule by mouth three times a day   Problem # 4:  OBESITY, UNSPECIFIED (ICD-278.00) Assessment: Unchanged  Problem # 5:  MIGRAINE HEADACHE (ICD-346.90) Assessment: Improved  Complete Medication List: 1)  Flexeril 10 Mg Tabs (Cyclobenzaprine hcl) .... One half to one tab by mouth three times a day as needed 2)  Topamax 200 Mg Tabs (Topiramate) .... One tab by mouth at bedtime 3)  Symbicort 80-4.5 Mcg/act Aero (Budesonide-formoterol fumarate) .... Inhale two puffs twice daily 4)  Septra Ds 800-160 Mg Tabs (Sulfamethoxazole-trimethoprim) .... Take 1 tablet by mouth two times a day 5)  Tessalon Perles 100 Mg Caps (Benzonatate) .... Take 1 capsule by mouth three times a day 6)  Fluconazole 150 Mg Tabs (Fluconazole) .... Take 1 tablet by mouth once a day   Patient Instructions: 1)  You are being treated for sinusitis, cystitis and beronchitis. 2)  It is important that you exercise regularly at least 20 minutes 5 times a week. If you develop chest pain, have severe difficulty breathing, or feel very tired , stop exercising immediately and seek medical attention. 3)  You need to lose weight. Consider a lower calorie diet and regular exercise.  4)  Wk excuse to return on 05/14/08.   Prescriptions: FLUCONAZOLE 150 MG TABS (FLUCONAZOLE) Take 1 tablet by mouth once a day  #1 x 0   Entered by:   Worthy Keeler LPN   Authorized by:   Syliva Overman MD   Signed by:   Worthy Keeler LPN on 95/62/1308   Method used:   Electronically to        [facility] (retail)       [address]        [place], [place]  [address]       Ph: [phone]       Fax: [phone]   RxID:   [id number] TESSALON PERLES 100 MG CAPS (BENZONATATE) Take 1 capsule by mouth three times a day  #30 x 0   Entered by:   Worthy Keeler LPN   Authorized by:   Syliva Overman MD   Signed by:   Worthy Keeler LPN on 38/75/6433   Method  used:   Electronically to        [facility] (retail)       [address]       [place], [place]  [address]       Ph: [phone]       Fax: [phone]   RxID:   [id number] SEPTRA DS 800-160 MG TABS (SULFAMETHOXAZOLE-TRIMETHOPRIM) Take 1 tablet by mouth two times a day  #20 x 0   Entered by:   Worthy Keeler LPN   Authorized by:   Syliva Overman MD   Signed by:   Worthy Keeler LPN on 40/03/2724   Method used:   Electronically to        [facility] (retail)       [address]       [place], [place]  [address]       Ph: [phone]       Fax: [phone]   RxID:   [id number] FLUCONAZOLE 150 MG TABS (FLUCONAZOLE) Take 1 tablet by mouth once a day  #1 x 0   Entered and Authorized by:   Syliva Overman MD   Signed by:   Syliva Overman MD on 05/10/2008   Method used:   Electronically to        [facility]       [address]       [place], [place]  [address]       Ph: [phone]       Fax: [phone]   RxID:   [id number] TESSALON PERLES 100 MG CAPS (BENZONATATE) Take 1 capsule by mouth three times a day  #30 x 0   Entered and Authorized by:   Syliva Overman MD   Signed by:   Syliva Overman MD on 05/10/2008   Method used:   Electronically to        [facility] ([facility]       [address]       [place], [place]  [address]       Ph: [phone]       Fax: [phone]   RxID:   [id number] SEPTRA DS 800-160 MG TABS (SULFAMETHOXAZOLE-TRIMETHOPRIM) Take 1 tablet by mouth two times a day  #20 x 0   Entered and  Authorized by:   Syliva Overman MD   Signed by:   Syliva Overman MD on 05/10/2008   Method used:   Electronically to        [facility]       [address]       [place], [place]  [address]       Ph: [phone]       Fax: [phone]   RxID:   [id number]  ]   Laboratory Results   Urine Tests  Date/Time Received: 05/10/08 Date/Time Reported: 05/10/08  Routine Urinalysis   Color: yellow Appearance: Clear Glucose: negative   (Normal Range: Negative) Bilirubin: negative   (Normal Range: Negative) Ketone: negative   (Normal Range: Negative) Spec. Gravity: 1.025   (Normal Range: 1.003-1.035) Blood: trace-lysed   (Normal Range: Negative) pH: 6.0   (Normal Range: 5.0-8.0) Protein: negative   (Normal Range: Negative) Urobilinogen: 0.2   (  Normal Range: 0-1) Nitrite: negative   (Normal Range: Negative) Leukocyte Esterace: negative   (Normal Range: Negative)

## 2010-07-10 NOTE — Letter (Signed)
Summary: Out of Work  [facility]  [address]   [place], [place] [address]   Phone: [phone]  Fax: [phone]    Oct 15, 2008   Employee:  [name]    To Whom It May Concern:   For Medical reasons, please excuse the above named employee from work for the following dates:  Start:   10/15/2008  End:   10/17/2008  If you need additional information, please feel free to contact our office.         Sincerely,    Nayra Coury 

## 2010-07-10 NOTE — Letter (Signed)
Summary: Out of Work  [facility]  [address]   [place], [place] [address]   Phone: [phone]  Fax: [phone]    April 30, 2009   Employee:  AUNDREA HORACE Dob: May 10, 2066   To Whom It May Concern:   For Medical reasons, please excuse the above named employee from work for the following dates:  Start:   04/29/09  End:   05/06/09 to return with no restrictions  If you need additional information, please feel free to contact our office.         Sincerely,    Milus Mallick. Lodema Hong, MD

## 2010-07-10 NOTE — Progress Notes (Signed)
Summary: medicine/ headache  Phone Note Call from Patient   Summary of Call: head hurts so bad  but not a migraine  sinsus infection will u call in something at [facility] [place]  call back at work [phone]  [phone] ask for her Initial call taken by: Lind Guest,  April 26, 2009 1:48 PM  Follow-up for Phone Call        Head is stopped up and hurting, some cough with nasal congestion, yellowish colored phlegm. Wants something sent to [facility] [place] Follow-up by: Everitt Amber,  April 26, 2009 4:16 PM  Additional Follow-up for Phone Call Additional follow up Details #1::        schedule as a work in on Monday or Tuesday pls Additional Follow-up by: Syliva Overman MD,  April 28, 2009 9:28 PM    Additional Follow-up for Phone Call Additional follow up Details #2::    pt is coming in 04/30/2009 Follow-up by: Rudene Anda,  April 29, 2009 3:10 PM

## 2010-07-10 NOTE — Assessment & Plan Note (Signed)
Summary: office visit   Vital Signs:  Patient profile:   45 year old female Menstrual status:  hysterectomy Height:      67.5 inches Weight:      305 pounds BMI:     47.23 O2 Sat:      98 % on Room air Pulse rate:   63 / minute Pulse rhythm:   regular Resp:     16 per minute BP sitting:   100 / 70  (left arm)  Vitals Entered By: Worthy Keeler LPN (April 30, 2009 11:24 AM)  Nutrition Counseling: Patient's BMI is greater than 25 and therefore counseled on weight management options.  O2 Flow:  Room air CC: headache x 1 week Is Patient Diabetic? No Pain Assessment Patient in pain? yes     Location: head Intensity: 7 Type: aching Onset of pain  constant x 1 week   CC:  headache x 1 week.  History of Present Illness: pt has a 1 week h/o worsening headache associated with nausea and photophobia. Her regular migraine meds have nit helped. The headache is disabling, she has been unable to work with the headache.. She reports alot of stress on the job, which she thinks is the main cause of her symptoms.  Allergies: 1)  ! Pcn 2)  ! Codeine  Review of Systems      See HPI General:  Complains of fatigue and sleep disorder; denies chills and fever. Eyes:  Denies blurring and discharge. ENT:  Denies hoarseness, nasal congestion, sinus pressure, and sore throat. CV:  Denies chest pain or discomfort, palpitations, and swelling of feet; had normal cardiac eval recently. Resp:  Denies cough, shortness of breath, and sputum productive. GI:  Complains of nausea; denies abdominal pain, constipation, diarrhea, and vomiting; nausea associated wiith severe headache. GU:  Denies dysuria and urinary frequency. Derm:  Denies itching and rash. Neuro:  Complains of headaches; 1 week h/o uncontrolled right sided headache pounding  migraine not responding meds. Psych:  Denies anxiety and depression. Heme:  Denies abnormal bruising and bleeding. Allergy:  Denies hives or rash and itching  eyes.  Physical Exam  General:  Well-developed,obese,iin no acute distress; alert,appropriate and cooperative throughout examination. Pt in pain. HEENT: No facial asymmetry,  EOMI, No sinus tenderness, TM's Clear, oropharynx  pink and moist.   Chest: Clear to auscultation bilaterally.  CVS: S1, S2, No murmurs, No S3.   Abd: Soft, Nontender.  MS: Adequate ROM spine, hips, shoulders and knees.  Ext: No edema.   CNS: CN 2-12 intact, power tone and sensation normal throughout.   Skin: Intact, no visible lesions or rashes.  Psych: Good eye contact, normal affect.  Memory intact, not anxious or depressed appearing.    Impression & Recommendations:  Problem # 1:  HEADACHE (ICD-784.0) Assessment Deteriorated  The following medications were removed from the medication list:    Vicodin Es 7.5-750 Mg Tabs (Hydrocodone-acetaminophen) .Marland Kitchen... Take 1 tablet by mouth once a day as needed Her updated medication list for this problem includes:    Ibuprofen 800 Mg Tabs (Ibuprofen) .Marland Kitchen... Take 1 tablet by mouth two times a day as needed for severe headache, maximum two daily max twice weekly    Fioricet 50-325-40 Mg Tabs (Butalbital-apap-caffeine) .Marland Kitchen... Take 1 tablet by mouth two times a day as needed  Orders: Depo- Medrol 80mg  (J1040) Ketorolac-Toradol 15mg  (Z6109) Admin of Therapeutic Inj  intramuscular or subcutaneous ([address])  Problem # 2:  OBESITY, UNSPECIFIED (ICD-278.00) Assessment: Improved  Ht: 67.5 (04/30/2009)  Wt: 305 (04/30/2009)   BMI: 47.23 (04/30/2009)  Problem # 3:  NAUSEA (ICD-787.02) Assessment: Deteriorated  Orders: Zofran 1mg . injection (Z6109)  Complete Medication List: 1)  Flexeril 10 Mg Tabs (Cyclobenzaprine hcl) .... One half to one tab by mouth three times a day as needed 2)  Symbicort 80-4.5 Mcg/act Aero (Budesonide-formoterol fumarate) .... Inhale two puffs twice daily 3)  Ibuprofen 800 Mg Tabs (Ibuprofen) .... Take 1 tablet by mouth two times a day as needed for  severe headache, maximum two daily max twice weekly 4)  Alprazolam 0.25 Mg Tabs (Alprazolam) .... Take 1 tab by mouth at bedtime 5)  Fioricet 50-325-40 Mg Tabs (Butalbital-apap-caffeine) .... Take 1 tablet by mouth two times a day as needed  Patient Instructions: 1)  Please schedule a follow-up appointment in 2 months. 2)  PLS get counselling on stress mnagement through your job. 3)  You are being treated  for uncontrolled migraines.Pls start the prednisone today  4)  Congrats on weight loss, pls  keep it up. Prescriptions: FIORICET 50-325-40 MG TABS (BUTALBITAL-APAP-CAFFEINE) Take 1 tablet by mouth two times a day as needed  #40 x 1   Entered and Authorized by:   Syliva Overman MD   Signed by:   Syliva Overman MD on 04/30/2009   Method used:   Electronically to        [facility] (retail)       [address]       [place], [place]  [address]       Ph: [phone]       Fax: [phone]   RxID:   [id number] PREDNISONE (PAK) 5 MG TABS (PREDNISONE) Use as directed , start 04/30/2009  #21 x 0   Entered and Authorized by:   Syliva Overman MD   Signed by:   Syliva Overman MD on 04/30/2009   Method used:   Electronically to        [facility] (retail)       [address]       [place], [place]  [address]       Ph: [phone]       Fax: [phone]   RxID:   [id number] VICODIN ES 7.5-750 MG TABS (HYDROCODONE-ACETAMINOPHEN) Take 1 tablet by mouth once a day as needed  #10 x 0   Entered and Authorized by:   Syliva Overman MD   Signed by:   Syliva Overman MD on 04/30/2009   Method used:   Printed then faxed to ...       [facility] (retail)       [address]       [place], [place]  [address]       Ph: [phone]       Fax: [phone]   RxID:   [id number] ALPRAZOLAM 0.25 MG TABS (ALPRAZOLAM) Take 1 tab by mouth at bedtime  #30 x 1   Entered and Authorized by:   Syliva Overman MD   Signed by:   Syliva Overman MD on 04/30/2009   Method used:   Printed then faxed to ...       [facility] (retail)       [address] [facility]  [place], [place]  [address]       Ph: [phone]       Fax: [phone]   RxID:   [id number] IBUPROFEN 800 MG TABS (IBUPROFEN) Take 1 tablet by mouth two times a day as needed for severe headache, maximum two daily max twice weekly  #40 x 0   Entered and Authorized by:   Syliva Overman MD   Signed by:   Syliva Overman MD on 04/30/2009   Method used:   Electronically to        [facility] (retail)       [address]       [place], [place]  [address]       Ph: [phone]       Fax: [phone]   RxID:   [id number]    Medication Administration  Injection # 1:    Medication: Zofran 1mg . injection    Diagnosis: NAUSEA (ICD-787.02)    Route: IM    Site: RUOQ gluteus    Exp Date: 6/11    Lot #: [id number]    Mfr: [email or web address]    Comments: zofran 4mg  given    Patient tolerated injection without complications    Given by: Worthy Keeler LPN (April 30, 2009 12:03 PM)  Injection # 2:    Medication: Depo- Medrol 80mg     Diagnosis: HEADACHE (ICD-784.0)    Route: IM    Site: L deltoid    Exp Date: 1/13    Lot #: [id number]    Mfr: Pharmacia    Patient tolerated injection without complications    Given by: Worthy Keeler LPN (April 30, 2009 12:04 PM)  Injection # 3:    Medication: Ketorolac-Toradol 15mg     Diagnosis: HEADACHE (ICD-784.0)    Route: IM    Site: LUOQ gluteus    Exp Date: 01/07/2011    Lot #: [id number]    Mfr: [email or web address]    Comments: toradol 60mg  given    Patient tolerated injection without complications    Given by: Worthy Keeler LPN (April 30, 2009 12:05 PM)  Orders Added: 1)  Est. Patient Level IV [[address]] 2)  Zofran 1mg . injection [J2405] 3)  Depo- Medrol 80mg  [J1040] 4)  Ketorolac-Toradol 15mg  [J1885] 5)  Admin of Therapeutic Inj   intramuscular or subcutaneous [[address]]

## 2010-07-10 NOTE — Medication Information (Signed)
Summary: Tax adviser   Imported By: Lind Guest 05/30/2010 09:01:33  _____________________________________________________________________  External Attachment:    Type:   Image     Comment:   External Document

## 2010-07-10 NOTE — Letter (Signed)
Summary: Out of Work  [facility]  [address]   [place], [place] [address]   Phone: [phone]  Fax: [phone]    September 02, 2009   Employee:  ZARAY GATCHEL    To Whom It May Concern:   For Medical reasons, please excuse the above named employee from work for the following dates:  Start:   09/02/2009  End:   09/03/2009 Without Restrictions  If you need additional information, please feel free to contact our office.         Sincerely,    Milus Mallick. Lodema Hong, M.D.

## 2010-07-10 NOTE — Letter (Signed)
Summary: Out of Work  [facility]  [address]   [place], [place] [address]   Phone: [phone]  Fax: [phone]    May 10, 2008   Employee:  Lonia Mad Mignone    To Whom It May Concern:   For Medical reasons, please excuse the above named employee from work for the following dates:  Start:   05/10/08  End:   05/14/08  If you need additional information, please feel free to contact our office.         Sincerely,    Worthy Keeler LPN

## 2010-07-10 NOTE — Letter (Signed)
Summary: Letter  Letter   Imported By: Lind Guest 04/22/2009 10:39:54  _____________________________________________________________________  External Attachment:    Type:   Image     Comment:   External Document

## 2010-07-10 NOTE — Assessment & Plan Note (Signed)
Summary: physical   Vital Signs:  Patient profile:   45 year old female Menstrual status:  hysterectomy Height:      67.5 inches Weight:      289.50 pounds BMI:     44.83 O2 Sat:      98 % on Room air Pulse rate:   67 / minute Pulse rhythm:   regular Resp:     16 per minute BP sitting:   122 / 88  (left arm)  Vitals Entered By: Adella Hare LPN (May 29, 2010 8:25 AM)  Nutrition Counseling: Patient's BMI is greater than 25 and therefore counseled on weight management options.  O2 Flow:  Room air CC: PHYSICAL Is Patient Diabetic? No Comments DID NOT BRING MEDS TO OV  Vision Screening:Left eye w/o correction: 20 / 40 Right Eye w/o correction: 20 / 40 Both eyes w/o correction:  20/ 30        Vision Entered By: Adella Hare LPN (May 29, 2010 8:26 AM)   Primary Care Provider:  Syliva Overman MD  CC:  PHYSICAL.  History of Present Illness: Reports  that she has been doing fairly well Denies recent fever or chills. Denies sinus pressure, nasal congestion , ear pain or sore throat. Denies chest congestion, or cough productive of sputum. Denies chest pain, palpitations, PND, orthopnea or leg swelling. Denies abdominal pain, nausea, vomitting, diarrhea or constipation. Denies change in bowel movements or bloody stool. Denies dysuria , frequency, incontinence or hesitancy.  Report improvement in her headache frequency with topamax use, states when she has a severe headache her only relief is injections. stres is a major trigger to her headaches Denies depression, anxiety or insomnia. Denies  rash, lesions, or itch.     Allergies: 1)  ! Pcn 2)  ! Codeine  Review of Systems      See HPI General:  Complains of fatigue. Eyes:  Denies discharge, eye pain, and red eye. MS:  Complains of joint pain, low back pain, mid back pain, and stiffness; left neck spasm x 1 week. Neuro:  Complains of headaches. Endo:  Denies cold intolerance, excessive hunger,  excessive thirst, excessive urination, and heat intolerance. Heme:  Denies abnormal bruising and bleeding. Allergy:  Denies hives or rash and itching eyes.  Physical Exam  General:  Well-developed,obese,in no acute distress; alert,appropriate and cooperative throughout examination Head:  Normocephalic and atraumatic without obvious abnormalities. No apparent alopecia or balding. Eyes:  No corneal or conjunctival inflammation noted. EOMI. Perrla. Funduscopic exam benign, without hemorrhages, exudates or papilledema. Vision grossly normal. Ears:  External ear exam shows no significant lesions or deformities.  Otoscopic examination reveals clear canals, tympanic membranes are intact bilaterally without bulging, retraction, inflammation or discharge. Hearing is grossly normal bilaterally. Nose:  External nasal examination shows no deformity or inflammation. Nasal mucosa are pink and moist without lesions or exudates. Mouth:  Oral mucosa and oropharynx without lesions or exudates.  Teeth in good repair. Neck:  No deformities, masses, or tenderness noted.left cervical spasm  Chest Wall:  No deformities, masses, or tenderness noted. Breasts:  No mass, nodules, thickening, tenderness, bulging, retraction, inflamation, nipple discharge or skin changes noted.   Lungs:  Normal respiratory effort, chest expands symmetrically. Lungs are clear to auscultation, no crackles or wheezes. Heart:  Normal rate and regular rhythm. S1 and S2 normal without gallop, murmur, click, rub or other extra sounds. Abdomen:  Bowel sounds positive,abdomen soft and non-tender without masses, organomegaly or hernias noted. Rectal:  No external abnormalities  noted. Normal sphincter tone. No rectal masses or tenderness. Genitalia:  normal introitus, no external lesions, no vaginal discharge, and no adnexal masses or tenderness. uterus absent  Msk:  No deformity or scoliosis noted of thoracic or lumbar spine.   Pulses:  R and L  carotid,radial,femoral,dorsalis pedis and posterior tibial pulses are full and equal bilaterally Extremities:  No clubbing, cyanosis, edema, or deformity noted with normal full range of motion of all joints.   Neurologic:  No cranial nerve deficits noted. Station and gait are normal. Plantar reflexes are down-going bilaterally. DTRs are symmetrical throughout. Sensory, motor and coordinative functions appear intact. Skin:  Intact without suspicious lesions or rashes Cervical Nodes:  No lymphadenopathy noted Axillary Nodes:  No palpable lymphadenopathy Inguinal Nodes:  No significant adenopathy Psych:  Cognition and judgment appear intact. Alert and cooperative with normal attention span and concentration. No apparent delusions, illusions, hallucinations   Impression & Recommendations:  Problem # 1:  HEADACHE (ICD-784.0) Assessment Improved  Problem # 2:  OBESITY, UNSPECIFIED (ICD-278.00) Assessment: Unchanged  Ht: 67.5 (05/29/2010)   Wt: 289.50 (05/29/2010)   BMI: 44.83 (05/29/2010) therapeutic lifestyle change discussed and encouraged  Problem # 3:  Preventive Health Care (ICD-V70.0) Assessment: Comment Only the importance of regular physical activity along with dietary modification to facilitate weight loss was discussed and encouraged  Problem # 4:  WHEEZING (ICD-786.07) Assessment: Deteriorated albuterol prescribed fo as needed  use  Problem # 5:  NECK SPASM (ICD-847.0) Assessment: Deteriorated  The following medications were removed from the medication list:    Flexeril 10 Mg Tabs (Cyclobenzaprine hcl) ..... One half to one tab by mouth three times a day as needed    Fioricet 50-325-40 Mg Tabs (Butalbital-apap-caffeine) .Marland Kitchen... Take 1 tablet by mouth two times a day as needed    Diclofenac Sodium 75 Mg Tbec (Diclofenac sodium) .Marland Kitchen... Take 1 tablet by mouth once a day as needed Her updated medication list for this problem includes:    Ibuprofen 800 Mg Tabs (Ibuprofen) .Marland Kitchen... Take  1 tablet by mouth three times a day as needed for severe headache, maximum 9 tablets per week    Cyclobenzaprine Hcl 10 Mg Tabs (Cyclobenzaprine hcl) .Marland Kitchen... Take 1 tablet by mouth three times a day as needed for neck spasm  Complete Medication List: 1)  Ibuprofen 800 Mg Tabs (Ibuprofen) .... Take 1 tablet by mouth three times a day as needed for severe headache, maximum 9 tablets per week 2)  Cyclobenzaprine Hcl 10 Mg Tabs (Cyclobenzaprine hcl) .... Take 1 tablet by mouth three times a day as needed for neck spasm 3)  Proventil Hfa 108 (90 Base) Mcg/act Aers (Albuterol sulfate) .... 2 puffs every 6 to 8 hours as needed for wheezing  Other Orders: T-Basic Metabolic Panel [phone]) T-CBC w/Diff [phone]) T- Hemoglobin A1C ([id number]) T-TSH ([id number]) Radiology Referral (Radiology) Hemoccult Guaiac-1 spec.(in office) (82270) Pap Smear (28413)  Patient Instructions: 1)  Please schedule a follow-up appointment in 4 months. 2)  It is important that you exercise regularly at least 20 minutes 5 times a week. If you develop chest pain, have severe difficulty breathing, or feel very tired , stop exercising immediately and seek medical attention. 3)  You need to lose weight. Consider a lower calorie diet and regular exercise. CONGRATS on weight loss, pls kleep it up 4)  BMP prior to visit, ICD-9: 5)  TSH prior to visit, ICD-9: 6)  CBC w/ Diff prior to visit, ICD-9:  today 7)  HbgA1C prior to visit,  ICD-9: 8)  Meds are sent in for neck spasm and headache.Also for wheezing. 9)  Seasons Greetings!!! Prescriptions: PROVENTIL HFA 108 (90 BASE) MCG/ACT AERS (ALBUTEROL SULFATE) 2 puffs every 6 to 8 hours as needed for wheezing  #1 x 3   Entered and Authorized by:   Syliva Overman MD   Signed by:   Syliva Overman MD on 05/29/2010   Method used:   Printed then faxed to ...       [facility] (retail)       [address]       [place], [place]  [address]        Ph: [phone]       Fax: [phone]   RxID:   [id number] CYCLOBENZAPRINE HCL 10 MG TABS (CYCLOBENZAPRINE HCL) Take 1 tablet by mouth three times a day as needed for neck spasm  #30 x 3   Entered and Authorized by:   Syliva Overman MD   Signed by:   Syliva Overman MD on 05/29/2010   Method used:   Printed then faxed to ...       [facility] (retail)       [address]       [place], [place]  [address]       Ph: [phone]       Fax: [phone]   RxID:   [id number] IBUPROFEN 800 MG TABS (IBUPROFEN) Take 1 tablet by mouth three times a day as needed for severe headache, maximum 9 tablets per week  #50 x 1   Entered and Authorized by:   Syliva Overman MD   Signed by:   Syliva Overman MD on 05/29/2010   Method used:   Printed then faxed to ...       [facility] (retail)       [address]       [place], [place]  [address]       Ph: [phone]       Fax: [phone]   RxID:   [id number]    Orders Added: 1)  Est. Patient 40-64 years [99396] 2)  T-Basic Metabolic Panel [phone] 3)  T-CBC w/Diff [[id number]] 4)  T- Hemoglobin A1C [83036-23375] 5)  T-TSH [62703-50093] 6)  Radiology Referral [Radiology] 7)  Hemoccult Guaiac-1 spec.(in office) [82270] 8)  Pap Smear [88150]    Laboratory Results  Date/Time Received: May 29, 2010 9:16 AM  Date/Time Reported: May 29, 2010 9:16 AM   Stool - Occult Blood Hemmoccult #1: negative Date: 05/29/2010 Comments: 5030 05/14 81829 1L 03/12 Adella Hare LPN  May 29, 2010 9:16 AM

## 2010-07-10 NOTE — Assessment & Plan Note (Signed)
Summary: office visit   Vital Signs:  Patient profile:   45 year old female Menstrual status:  hysterectomy Height:      67.5 inches Weight:      310 pounds O2 Sat:      96 % Pulse rate:   80 / minute Pulse rhythm:   regular Resp:     16 per minute BP sitting:   130 / 80  (left arm) Cuff size:   xl  Vitals Entered By: Everitt Amber LPN (November 21, 2009 2:36 PM) CC: has been having a migraine for over a week, her meds are not helping it. Her face has been swelling on the right side and her doctor at work told her that it was possibly neurologic and she needed an MRI to check for a mass or psooibility of TIA's   Primary Care Provider:  Syliva Overman MD  CC:  has been having a migraine for over a week and her meds are not helping it. Her face has been swelling on the right side and her doctor at work told her that it was possibly neurologic and she needed an MRI to check for a mass or psooibility of TIA's.  History of Present Illness: pt reports a 5 day h/o severe disBLING HEADACHESTAES SHE SAW HER pa AT WORK, SHE WAS TOLD YESTERDAY THAT SHE APPEAred to have facial swelling and needed to see someone. OF NOTE, SHE HAS NEITHER TOPAMAX nor maxalt at home.she states she will start this , and also is commiting to f/u with a neurologist. She has seen at least 3 diifferent neurologists over the years regarding her headaches. Reports  that prior to this she had been doing well Denies recent fever or chills. Denies sinus pressure, nasal congestion , ear pain or sore throat. Denies chest congestion, or cough productive of sputum. Denies chest pain, palpitations, PND, orthopnea or leg swelling. Denies abdominal pain, nausea, vomitting, diarrhea or constipation. Denies change in bowel movements or bloody stool. Denies dysuria , frequency, incontinence or hesitancy.  Denies, vertigo, seizures. reports improvement in her depression and anxiety. She is not consitently exercising, nor has her diet  changed, her weight has increased. Denies  rash, lesions, or itch.      Allergies (verified): 1)  ! Pcn 2)  ! Codeine  Review of Systems      See HPI Eyes:  Denies discharge, eye pain, and red eye. MS:  Complains of low back pain and mid back pain; low back pain aggravated by walking intermittent x 2 years, no response to ibuprofen. Endo:  Denies cold intolerance, excessive hunger, excessive thirst, excessive urination, heat intolerance, polyuria, and weight change. Heme:  Denies abnormal bruising and bleeding. Allergy:  Denies hives or rash and itching eyes.  Physical Exam  General:  Well-developed,obese,in no acute distress; alert,appropriate and cooperative throughout examination HEENT: No facial asymmetry,  EOMI, No sinus tenderness, TM's Clear, oropharynx  pink and moist.   Chest: Clear to auscultation bilaterally.  CVS: S1, S2, No murmurs, No S3.   Abd: Soft, Nontender.  MS: decreased  ROM spine, hips, shoulders and knees.  Ext: No edema.   CNS: CN 2-12 intact, power tone and sensation normal throughout.   Skin: Intact, no visible lesions or rashes.  Psych: Good eye contact, normal affect.  Memory intact, not anxious or depressed appearing.    Impression & Recommendations:  Problem # 1:  HEADACHE (ICD-784.0) Assessment Deteriorated  The following medications were removed from the medication list:  Ibuprofen 800 Mg Tabs (Ibuprofen) .Marland Kitchen... Take 1 tablet by mouth two times a day as needed for severe headache, maximum two daily max twice weekly Her updated medication list for this problem includes:    Fioricet 50-325-40 Mg Tabs (Butalbital-apap-caffeine) .Marland Kitchen... Take 1 tablet by mouth two times a day as needed    Maxalt-mlt 10 Mg Tbdp (Rizatriptan benzoate) ..... One at headache onset, may repeat every 2 hours if needed , max 3 tablets in 24 hrs, maximum use is twice weekly    Diclofenac Sodium 75 Mg Tbec (Diclofenac sodium) .Marland Kitchen... Take 1 tablet by mouth once a day as  needed  Problem # 2:  OBESITY, UNSPECIFIED (ICD-278.00) Assessment: Deteriorated  Ht: 67.5 (11/21/2009)   Wt: 310 (11/21/2009)   BMI: 47.58 (09/02/2009)  Problem # 3:  BACK PAIN (ICD-724.5) Assessment: Deteriorated  The following medications were removed from the medication list:    Ibuprofen 800 Mg Tabs (Ibuprofen) .Marland Kitchen... Take 1 tablet by mouth two times a day as needed for severe headache, maximum two daily max twice weekly Her updated medication list for this problem includes:    Flexeril 10 Mg Tabs (Cyclobenzaprine hcl) ..... One half to one tab by mouth three times a day as needed    Fioricet 50-325-40 Mg Tabs (Butalbital-apap-caffeine) .Marland Kitchen... Take 1 tablet by mouth two times a day as needed    Diclofenac Sodium 75 Mg Tbec (Diclofenac sodium) .Marland Kitchen... Take 1 tablet by mouth once a day as needed  Complete Medication List: 1)  Flexeril 10 Mg Tabs (Cyclobenzaprine hcl) .... One half to one tab by mouth three times a day as needed 2)  Symbicort 80-4.5 Mcg/act Aero (Budesonide-formoterol fumarate) .... Inhale two puffs twice daily 3)  Alprazolam 0.25 Mg Tabs (Alprazolam) .... Take 1 tab by mouth at bedtime 4)  Fioricet 50-325-40 Mg Tabs (Butalbital-apap-caffeine) .... Take 1 tablet by mouth two times a day as needed 5)  Maxalt-mlt 10 Mg Tbdp (Rizatriptan benzoate) .... One at headache onset, may repeat every 2 hours if needed , max 3 tablets in 24 hrs, maximum use is twice weekly 6)  Promethazine Hcl 25 Mg Tabs (Promethazine hcl) .... Take 1 tablet by mouth two times a day as needed 7)  Diclofenac Sodium 75 Mg Tbec (Diclofenac sodium) .... Take 1 tablet by mouth once a day as needed 8)  Topiramate 25 Mg Tabs (Topiramate) .... Take 1 tablet by mouth two times a day  Patient Instructions: 1)  Please schedule a follow-up appointment in 3 months. 2)  It is important that you exercise regularly at least 20 minutes 5 times a week. If you develop chest pain, have severe difficulty breathing, or feel  very tired , stop exercising immediately and seek medical attention. 3)  You need to lose weight. Consider a lower calorie diet and regular exercise. goal is at least 8 pounds 4)  pls start to take your headache meds regularly, as we discussed and call  neurologist for f/u since your headaches are bad. Prescriptions: MAXALT-MLT 10 MG TBDP (RIZATRIPTAN BENZOATE) one at headache onset, may repeat every 2 hours if needed , max 3 tablets in 24 hrs, maximum use is twice weekly  #10 x 2   Entered by:   Everitt Amber LPN   Authorized by:   Syliva Overman MD   Signed by:   Everitt Amber LPN on 66/44/0347   Method used:   Electronically to        [facility] (retail)  [address]       [place], [place]  [address]       Ph: [phone]       Fax: [phone]   RxID:   [id number] TOPIRAMATE 25 MG TABS (TOPIRAMATE) Take 1 tablet by mouth two times a day  #60 x 3   Entered and Authorized by:   Syliva Overman MD   Signed by:   Syliva Overman MD on 11/21/2009   Method used:   Electronically to        [facility] (retail)       [address]       [place], [place]  [address]       Ph: [phone]       Fax: [phone]   RxID:   [id number] DICLOFENAC SODIUM 75 MG TBEC (DICLOFENAC SODIUM) Take 1 tablet by mouth once a day as needed  #30 x 2   Entered and Authorized by:   Syliva Overman MD   Signed by:   Syliva Overman MD on 11/21/2009   Method used:   Electronically to        [facility] (retail)       [address]       [place], [place]  [address]       Ph: [phone]       Fax: [phone]   RxID:   [id number]

## 2010-07-10 NOTE — Assessment & Plan Note (Signed)
Summary: OV   Vital Signs:  Patient profile:   45 year old female Menstrual status:  hysterectomy Height:      67.5 inches Weight:      311 pounds O2 Sat:      98 % Pulse rate:   67 / minute Pulse rhythm:   regular Resp:     16 per minute BP sitting:   118 / 78  (left arm) Cuff size:   xl  Vitals Entered By: Everitt Amber (February 28, 2009 8:52 AM) CC: has been out of breath alot, started noticing it a few weeks ago but lastweek everything she would walk around anywhere it was hard to catch her breath.   CC:  has been out of breath alot and started noticing it a few weeks ago but lastweek everything she would walk around anywhere it was hard to catch her breath..  History of Present Illness: 3 month h/o exertional dyspnea, she has used an inhaler at times with some relief. he thinks she may be gathering fluid, has no ankles, has wakened up unable to breathe.Uses 2 pillows when she lies down, and has done so for some time now. She denies any recent fevr or chills. She reports improivement in the frequency and severity of her migraines.  Current Medications (verified): 1)  Flexeril 10 Mg  Tabs (Cyclobenzaprine Hcl) .... One Half To One Tab By Mouth Three Times A Day As Needed 2)  Symbicort 80-4.5 Mcg/act  Aero (Budesonide-Formoterol Fumarate) .... Inhale Two Puffs Twice Daily  Allergies (verified): 1)  ! Pcn 2)  ! Codeine  Review of Systems      See HPI General:  Complains of fatigue and sleep disorder; snoring and fatigue. Eyes:  Denies blurring and discharge. ENT:  Denies hoarseness, sinus pressure, and sore throat. CV:  Complains of difficulty breathing while lying down, shortness of breath with exertion, and swelling of feet; denies palpitations. Resp:  Complains of shortness of breath. GI:  Denies abdominal pain, constipation, diarrhea, nausea, and vomiting. GU:  Denies dysuria and urinary frequency. MS:  Complains of low back pain and mid back pain; low back pain  secondary to flat feet,.has special shoes for this. Derm:  Denies itching, lesion(s), and rash. Neuro:  Complains of headaches; denies seizures and sensation of room spinning; fairly well controlled at this time. Psych:  Complains of anxiety; denies depression. Endo:  Denies cold intolerance, excessive hunger, excessive thirst, excessive urination, heat intolerance, polyuria, and weight change. Heme:  Denies abnormal bruising and bleeding. Allergy:  Denies hives or rash.  Physical Exam  General:  alert, well-hydrated, and overweight-appearing.Pt is in no cardiopulmonary distress. HEENT: No facial asymmetry,  EOMI, No sinus tenderness, TM's Clear, oropharynx  pink and moist.   Chest: Clear to auscultation bilaterally.  CVS: S1, S2, No murmurs, No S3.   Abd: Soft, Nontender.  MS: Adequate ROM spine, hips, shoulders and knees.  Ext: No edema.   CNS: CN 2-12 intact, power tone and sensation normal throughout.   Skin: Intact, no visible lesions or rashes.  Psych: Good eye contact, normal affect.  Memory intact, not anxious or depressed appearing.     Impression & Recommendations:  Problem # 1:  DYSPNEA ON EXERTION (ICD-786.09) Assessment Deteriorated  Her updated medication list for this problem includes:    Symbicort 80-4.5 Mcg/act Aero (Budesonide-formoterol fumarate) ..... Inhale two puffs twice daily  Orders: CXR- 2view (CXR) Pulmonary Referral (Pulmonary) Cardiology Referral (Cardiology)  Problem # 2:  HEADACHE (ICD-784.0) Assessment:  Improved  Problem # 3:  OBESITY, UNSPECIFIED (ICD-278.00) Assessment: Deteriorated  Orders: T-Lipid Profile ([id number]) T-Lipid Profile ([address])  Ht: 67.5 (02/28/2009)   Wt: 311 (02/28/2009)   BMI: 45.84 (10/15/2008)  Complete Medication List: 1)  Flexeril 10 Mg Tabs (Cyclobenzaprine hcl) .... One half to one tab by mouth three times a day as needed 2)  Symbicort 80-4.5 Mcg/act Aero (Budesonide-formoterol fumarate) .... Inhale  two puffs twice daily  Other Orders: T-Basic Metabolic Panel [id number]) T-CBC w/Diff [phone]) T-TSH [id number]) T-TSH 215 [id number]) T- * Misc. Laboratory test [phone])  Patient Instructions: 1)  Please schedule a follow-up appointment in 2 months. 2)  You will be refered for CXR , lung function testing , and to the cardiologist for evaluaton of dyspnea. 3)  INEED THE lABS IF DONE IN MARCH 2010 or after by [facility]. 4)  IF done earlier you will need repteated , we will give you the requisition today and pls get them done by next Tuesaday. 5)  You absolutely have to lose weight. Prescriptions: FLEXERIL 10 MG  TABS (CYCLOBENZAPRINE HCL) one half to one tab by mouth three times a day as needed  #30 x 3   Entered by:   Worthy Keeler LPN   Authorized by:   Syliva Overman MD   Signed by:   Worthy Keeler LPN on 25/95/6387   Method used:   Electronically to        [facility] (retail)       [address]       [place], [place]  [address]       Ph: [phone]       Fax: [phone]   RxID:   [id number]

## 2010-07-10 NOTE — Letter (Signed)
Summary: Work Excuse  [facility]  [address]   [place], [place] [address]   Phone: [phone]  Fax: [phone]    Today's Date: May 29, 2010  Name of Patient: Shannon Roth  The above named patient had a medical visit today at:  8am   Please take this into consideration when reviewing the time away from work/school.    Special Instructions:  [ * ] None  [  ] To be off the remainder of today, returning to the normal work / school schedule tomorrow.  [  ] To be off until the next scheduled appointment on ______________________.  [  ] Other ________________________________________________________________ ________________________________________________________________________   Sincerely yours,   Milus Mallick. Lodema Hong, MD

## 2010-07-10 NOTE — Assessment & Plan Note (Signed)
Summary: office visit   Vital Signs:  Patient profile:   45 year old female Menstrual status:  hysterectomy Height:      67.5 inches Weight:      300.75 pounds BMI:     46.58 O2 Sat:      100 % Pulse rate:   62 / minute Resp:     16 per minute BP sitting:   102 / 78  (right arm)  Vitals Entered By: Everitt Amber LPN (March 13, 2010 1:51 PM) CC: Has had a migraine that won't stop since monday, her whole body hurts Pain Assessment Patient in pain? yes        Primary Provider:  Syliva Overman MD  CC:  Has had a migraine that won't stop since monday and her whole body hurts.  History of Present Illness: Pt presents today with c/o migraine x 4 days.  This is her typical severe migraine.  Takes Topamax nightly for suppression.  Also has tried prescription meds for treatment of this acute attack without improvement.  Injections that she has received here previously help.  She is uncertain what triggered this migraine. + dble vision, + photophobia + phonophobia, + nausea, no vomiting with this migraine but has with others. parasthesias bilat hands.  Allergies (verified): 1)  ! Pcn 2)  ! Codeine  Past History:  Past medical history reviewed for relevance to current acute and chronic problems.  Past Medical History: Reviewed history from 09/12/2007 and no changes required. Current Problems:  MIGRAINE HEADACHE (ICD-346.90) RECTAL BLEEDING, HX OF (ICD-V12.79) OBESITY, UNSPECIFIED (ICD-278.00)  Review of Systems General:  Denies chills and fever. Eyes:  Complains of double vision and light sensitivity; denies blurring. ENT:  Denies earache, nasal congestion, postnasal drainage, and sore throat. CV:  Denies chest pain or discomfort and palpitations. Resp:  Denies shortness of breath. GI:  Complains of nausea; denies abdominal pain and vomiting.  Physical Exam  General:  uncomfortable-appearing in dimmed exam room. well-hydrated, appropriate dress, cooperative to  examination, and uncomfortable-appearing.   Head:  Normocephalic and atraumatic without obvious abnormalities. No apparent alopecia or balding. Eyes:  pupils equal, pupils round, and pupils reactive to light.   Ears:  External ear exam shows no significant lesions or deformities.  Otoscopic examination reveals clear canals, tympanic membranes are intact bilaterally without bulging, retraction, inflammation or discharge. Hearing is grossly normal bilaterally. Nose:  External nasal examination shows no deformity or inflammation. Nasal mucosa are pink and moist without lesions or exudates. Mouth:  Oral mucosa and oropharynx without lesions or exudates.  Teeth in good repair. Neck:  No deformities, masses, or tenderness noted. Lungs:  Normal respiratory effort, chest expands symmetrically. Lungs are clear to auscultation, no crackles or wheezes. Heart:  Normal rate and regular rhythm. S1 and S2 normal without gallop, murmur, click, rub or other extra sounds. Pulses:  R radial normal and L radial normal.   Extremities:  No clubbing, cyanosis, edema, or deformity noted with normal full range of motion of all joints.   Neurologic:  alert & oriented X3, strength normal in all extremities, sensation intact to light touch, gait normal, and DTRs symmetrical and normal.   Cervical Nodes:  No lymphadenopathy noted Psych:  Oriented X3, memory intact for recent and remote, not anxious appearing, and not depressed appearing.     Impression & Recommendations:  Problem # 1:  MIGRAINE HEADACHE (ICD-346.90) Assessment Deteriorated  Her updated medication list for this problem includes:    Fioricet 50-325-40 Mg  Tabs (Butalbital-apap-caffeine) .Marland Kitchen... Take 1 tablet by mouth two times a day as needed    Maxalt-mlt 10 Mg Tbdp (Rizatriptan benzoate) ..... One at headache onset, may repeat every 2 hours if needed , max 3 tablets in 24 hrs, maximum use is twice weekly    Diclofenac Sodium 75 Mg Tbec (Diclofenac sodium)  .Marland Kitchen... Take 1 tablet by mouth once a day as needed  Orders: Ketorolac-Toradol 15mg  (A3557) Zofran 1mg . injection (D2202) Admin of Therapeutic Inj  intramuscular or subcutaneous (54270)  Complete Medication List: 1)  Flexeril 10 Mg Tabs (Cyclobenzaprine hcl) .... One half to one tab by mouth three times a day as needed 2)  Symbicort 80-4.5 Mcg/act Aero (Budesonide-formoterol fumarate) .... Inhale two puffs twice daily 3)  Alprazolam 0.25 Mg Tabs (Alprazolam) .... Take 1 tab by mouth at bedtime 4)  Fioricet 50-325-40 Mg Tabs (Butalbital-apap-caffeine) .... Take 1 tablet by mouth two times a day as needed 5)  Maxalt-mlt 10 Mg Tbdp (Rizatriptan benzoate) .... One at headache onset, may repeat every 2 hours if needed , max 3 tablets in 24 hrs, maximum use is twice weekly 6)  Promethazine Hcl 25 Mg Tabs (Promethazine hcl) .... Take 1 tablet by mouth two times a day as needed 7)  Diclofenac Sodium 75 Mg Tbec (Diclofenac sodium) .... Take 1 tablet by mouth once a day as needed 8)  Topiramate 25 Mg Tabs (Topiramate) .... Take 1 tablet by mouth two times a day  Other Orders: Depo- Medrol 80mg  (W2376)  Patient Instructions: 1)  Keep your routine appt with Dr Lodema Hong in November. 2)  You have received Zofran for nausea, Depo Medrol and Toradol for pain. 3)  If your headache does not improve , or if you worsen go to the ER.   Medication Administration  Injection # 1:    Medication: Depo- Medrol 80mg     Diagnosis: MIGRAINE HEADACHE (ICD-346.90)    Route: IM    Site: RUOQ gluteus    Exp Date: 10/2010    Lot #: [id number]    Mfr: Pharmacia    Comments: 80mg  given     Patient tolerated injection without complications    Given by: Everitt Amber LPN (March 13, 2010 4:01 PM)  Injection # 2:    Medication: Ketorolac-Toradol 15mg     Diagnosis: MIGRAINE HEADACHE (ICD-346.90)    Route: IM    Site: RUOQ gluteus    Exp Date: 08/2011    Lot #: [id number]     Mfr: [email or web address]    Comments: 60mg  given      Patient tolerated injection without complications    Given by: Everitt Amber LPN (March 13, 2010 4:01 PM)  Injection # 3:    Medication: Zofran 1mg . injection    Diagnosis: MIGRAINE HEADACHE (ICD-346.90)    Route: IM    Site: LUOQ gluteus    Exp Date: 09/2011    Lot #: [id number]    Mfr: [email or web address]    Comments: 4mg  given     Patient tolerated injection without complications    Given by: Everitt Amber LPN (March 13, 2010 4:02 PM)  Orders Added: 1)  Est. Patient Level III [76160] 2)  Depo- Medrol 80mg  [J1040] 3)  Ketorolac-Toradol 15mg  [J1885] 4)  Zofran 1mg . injection [J2405] 5)  Admin of Therapeutic Inj  intramuscular or subcutaneous [73710]

## 2010-09-29 ENCOUNTER — Ambulatory Visit: Payer: Self-pay | Admitting: Family Medicine

## 2010-10-24 NOTE — Op Note (Signed)
NAME:  DORTHY, MAGNUSSEN                       ACCOUNT NO.:  [id number]   MEDICAL RECORD NO.:  [id number]                   PATIENT TYPE:  INP   LOCATION:  A318                                 FACILITY:  [facility]   PHYSICIAN:  Dirk Dress. Katrinka Blazing, M.D.                DATE OF BIRTH:  01-17-1966   DATE OF PROCEDURE:  DATE OF DISCHARGE:                                 OPERATIVE REPORT   PREOPERATIVE DIAGNOSES:  1. Dysfunctional uterine bleeding.  2. Uterine fibroids.  3. Anemia.   POSTOPERATIVE DIAGNOSES:  1. Dysfunctional uterine bleeding.  2. Uterine fibroids.  3. Anemia.  4. Cholelithiasis.   PROCEDURE:  Total abdominal hysterectomy.   SURGEON:  Dirk Dress. Katrinka Blazing, M.D.   DESCRIPTION OF PROCEDURE:  Under general endotracheal anesthesia, the  patient's abdomen was prepped and draped in a sterile field. Vaginal prep  was also done. A lower midline incision was made. Exploration of the upper  abdomen revealed a single large gallstone in the gallbladder. There was no  palpable gallbladder thickness. It could not be visualized from this  incision. The abdomen was packed off. The pelvis revealed a large  multilobulated uterus with multiple intramural and subserosal fibroids. The  largest fibroid came off  the dome of the uterus and extended up about  umbilical level. There was another very large fibroid off the right lower  aspect of the uterus and posterior aspect of the uterus. The round ligaments  were doubly clamped, divided and controlled with Ligaclips of #0 Dexon. The  tuboovarian complex was doubly clamped at its junction with the uterus. It  was divided and controlled with ligatures of #0 Dexon. The anterior bladder  flap was then developed. The uterine vessels were doubly clamped with  straight Kocher and straight Heaney clamps along the superior most aspect  and divided. They were controlled with #0 Dexon ligature. This was done  bilaterally down to the apex of the vagina. The  apex of the vagina was  clamped with a curved Heaney clamp, divided and controlled with a ligature  of #0 Dexon. Once the uterus was excised, the cuff was oversewn using  running locking #0 Prolene. Hemostasis was felt to be adequate. There was  minimal blood loss. The pelvis was reperitonealized using running 3-0  Biosyn. There was no bleeding. Irrigation was carried out. The sponge,  needle, instrument and blade counts were verified as correct x2. The abdomen  was then closed using #1  Prolene on the fascia, 2-0 Biosyn in the subcutaneous tissue and staples on  the skin. The patient tolerated the procedure well. A sterile dressing was  placed. She was awakened from anesthesia, transferred to a bed and taken to  the post anesthesia care unit.  Dirk Dress. Katrinka Blazing, M.D.    LCS/MEDQ  D:  02/28/2002  T:  03/01/2002  Job:  [id number]

## 2010-10-24 NOTE — Discharge Summary (Signed)
   NAMELACEE, GREY                            ACCOUNT NO.:  [id number]   MEDICAL RECORD NO.:  [id number]                  PATIENT TYPE:   LOCATION:                                       FACILITY:  [facility]   PHYSICIAN:  Dirk Dress. Katrinka Blazing, M.D.                DATE OF BIRTH:   DATE OF ADMISSION:  02/28/2002  DATE OF DISCHARGE:  03/04/2002                                 DISCHARGE SUMMARY   DISCHARGE DIAGNOSES:  1. Dysfunctional uterine bleeding.  2. Uterine fibroids.  3. Cholelithiasis.   PROCEDURE:  Total abdominal hysterectomy 9/23.   DISPOSITION:  The patient is discharged home in stable and satisfactory  condition.   DISCHARGE MEDICATIONS:  1. Duricef 500 mg b.i.d. for five days.  2. Tylox one or two every four hours as needed.  3. Ferrous sulfate 325 mg b.i.d.   SUMMARY:  A 45 year old female with severe dysmenorrhea. She had been  treated at [facility] and [facility] with complaints of  severe pain. She had severe clotting. She had bleeding up to two weeks.  Ultrasound showed a massive uterus with multiple enlarged fibroids which  were submucosal intramural and subserosal. Because of severe pain and  bleeding, it was felt that she needed to have hysterectomy. The patient was  counseled for this. She was admitted through day surgery and underwent total  abdominal hysterectomy on 9/23. Intraoperatively, she was found to have  gallstones. She was stable in the postoperative period. She felt much  better. Incision appeared to fine. She had some itching with the morphine  sulfate, but otherwise had no problems during this admission. She was found  at discharge on 9/27 in satisfactory condition.                                               Dirk Dress. Katrinka Blazing, M.D.    LCS/MEDQ  D:  05/23/2002  T:  05/25/2002  Job:  [id number]

## 2010-10-24 NOTE — H&P (Signed)
   NAME:  Shannon Roth, WAMPOLE                          ACCOUNT NO.:  [id number]   MEDICAL RECORD NO.:  [id number]                   PATIENT TYPE:  AMB   LOCATION:  DAY                                  FACILITY:  [facility]   PHYSICIAN:  Jerolyn Shin C. Katrinka Blazing, M.D.                DATE OF BIRTH:  [birth year]   DATE OF ADMISSION:  DATE OF DISCHARGE:                                HISTORY & PHYSICAL   HISTORY OF PRESENT ILLNESS:  [age]-year-old female with known history  of gallstones which were discovered at the time of hysterectomy in  September.  The patient has developed postprandial discomfort, epigastric  pain and right upper quadrant pain.  She has not had diarrhea.  She was  previously scheduled for April 04, 2002, but she had an upper respiratory  symptom and has been treated.  She is doing well now and is scheduled for  laparoscopic cholecystectomy.   PAST HISTORY:  She has gastroesophageal reflux disease and no other major  medical problems.   PAST SURGICAL HISTORY:  Surgery includes tubal ligation and total abdominal  hysterectomy.   MEDICATIONS:  1. Ferrous sulfate 325 mg b.i.d.  2. Tylox p.r.n. for pain.   PHYSICAL EXAMINATION:  VITAL SIGNS:  Blood pressure 112/72, pulse 78,  respirations 18.  Weight 277 pounds.  HEENT:  No abnormality noted.  NECK:  Neck supple without JVD or bruit.  CHEST:  Chest clear to auscultation.  No rales, rubs, rhonchi or wheezes.  HEART:  Regular rate and rhythm without murmur, gallop or rub.  ABDOMEN:  No epigastric or right upper quadrant tenderness.  Normal bowel  sounds.  EXTREMITIES:  No cyanosis, clubbing or edema.  NEUROLOGICAL:  No motor, sensory or cerebellar deficit.  Cranial nerves  intact.  Deep tendon reflexes are symmetric.   IMPRESSION:  1. Cholelithiasis with cholecystitis.  2. Gastroesophageal reflux disease.   PLAN:  Laparoscopic cholecystectomy.                                               Dirk Dress. Katrinka Blazing, M.D.    LCS/MEDQ  D:  04/07/2002  T:  04/07/2002  Job:  [id number]

## 2010-10-24 NOTE — Discharge Summary (Signed)
   NAME:  Shannon Roth, Shannon Roth                          ACCOUNT NO.:  [id number]   MEDICAL RECORD NO.:  [id number]                   PATIENT TYPE:  INP   LOCATION:  A327                                 FACILITY:  [facility]   PHYSICIAN:  Dirk Dress. Katrinka Blazing, M.D.                DATE OF BIRTH:  1966/01/15   DATE OF ADMISSION:  04/07/2002  DATE OF DISCHARGE:  04/11/2002                                 DISCHARGE SUMMARY   DISCHARGE DIAGNOSES:  1. Cholelithiasis with cholecystitis.  2. Postoperative atelectasis.  3. Gastroesophageal reflux disease.   SPECIAL PROCEDURE:  Laparoscopic cholecystectomy, April 07, 2002.   DISPOSITION:  The patient discharged home in stable improved condition.   DISCHARGE MEDICATIONS:  1. Tylox one q.4h. as needed for pain.  2. Phenergan 25 mg q.4h. as needed for nausea.   FOLLOW-UP:  The patient is scheduled to be seen in the office on April 19, 2002.   SUMMARY:  A 45 year old female with known history of gallstones which were  discovered at the time of hysterectomy in September 2003.  The patient  developed post prandial discomfort and epigastric pain and right upper  quadrant pain.  She was previously scheduled for the surgery on October 28  but she had upper respiratory symptoms that had to be treated.  She had no  other major medical problems.  Examination was unremarkable.  The patient  was admitted through day surgery and underwent laparoscopic cholecystectomy  on October 31.  She was very reluctant to ambulate postoperatively and had  decreased air movement, and developed atelectasis with some fever.  This was  treated aggressively.  She had a temperature elevation up to 103 but had no  other source except for her lungs.  It was felt that this was due to  atelectasis.  With increased pulmonary toilet the patient improved.  White  count did not elevate and she did not develop a left shift.  She received  nebulized albuterol and Atrovent.  With treatment she  started to feel  better.  Her temperature returned to normal, lungs cleared, and she was  finally discharged on postoperative day #4, much improved, with clear lungs,  afebrile, with a wound healing without difficulty.                                               Dirk Dress. Katrinka Blazing, M.D.    LCS/MEDQ  D:  05/27/2002  T:  05/29/2002  Job:  [id number]

## 2010-10-24 NOTE — H&P (Signed)
   NAME:  Shannon Roth, Shannon Roth                          ACCOUNT NO.:  [id number]   MEDICAL RECORD NO.:  [id number]                   PATIENT TYPE:  AMB   LOCATION:  DAY                                  FACILITY:  [facility]   PHYSICIAN:  Jerolyn Shin C. Katrinka Blazing, M.D.                DATE OF BIRTH:  August 14, 1965   DATE OF ADMISSION:  DATE OF DISCHARGE:                                HISTORY & PHYSICAL   HISTORY OF PRESENT ILLNESS:  [age]-year-old female with severe  dysmenorrhea.  The patient has been treated at [facility] and at  [facility].  She has had episodes of severe pain.  She uses up to a  24-pack of super-pads in 24 hours.  She has had severe clotting.  The  patient is status post tubal ligation.  With her last period, she had  bleeding for over two weeks.  Ultrasound shows a massive uterus with  multiple enlarged fibroids with submucosal and intramural and subserosal  fibroids.  Because of severe pain, it is felt that she needs to proceed with  a hysterectomy.   PAST HISTORY:  She has gastroesophageal reflux disease.  She is status post  tubal ligation.   MEDICATIONS:  Tylenol No. 3 two every four hours as needed.   REVIEW OF SYSTEMS:  Review of systems is positive for pelvic pain and  constipation.   PHYSICAL EXAMINATION:  VITAL SIGNS:  On examination, blood pressure 120/80,  pulse 76 and respirations 20.  Weight 293 pounds.  HEENT:  Unremarkable.  NECK:  Neck supple without JVD or bruit.  CHEST:  Chest clear to auscultation.  HEART:  Regular rate and rhythm without murmur, gallop or rub.  ABDOMEN:  Diffuse suprapubic tenderness with a palpable mass to the left of  midline.  PELVIC:  Pelvic reveals a tender pelvis with active menses with mass filling  the pelvis.  Ovaries could not be felt.  RECTAL:  Rectal was not done because of active bleeding.  NEUROLOGIC:  No focal motor, sensory or cerebellar deficit.   IMPRESSION:  1. Massive uterine fibroids with  dysfunctional uterine bleeding and     hypermenorrhea.  2. Gastroesophageal reflux disease.   PLAN:  The patient will have an abdominal hysterectomy.                                               Dirk Dress. Katrinka Blazing, M.D.    LCS/MEDQ  D:  02/27/2002  T:  02/28/2002  Job:  [id number]

## 2010-10-24 NOTE — Op Note (Signed)
   NAME:  Shannon Roth, Shannon Roth                          ACCOUNT NO.:  [id number]   MEDICAL RECORD NO.:  [id number]                   PATIENT TYPE:  OBV   LOCATION:  A327                                 FACILITY:  [facility]   PHYSICIAN:  Dirk Dress. Katrinka Blazing, M.D.                DATE OF BIRTH:  1965-09-28   DATE OF PROCEDURE:  DATE OF DISCHARGE:                                 OPERATIVE REPORT   PREOPERATIVE DIAGNOSES:  Cholecystitis, cholelithiasis.   POSTOPERATIVE DIAGNOSES:  Cholecystitis, cholelithiasis.   PROCEDURE:  Laparoscopic cholecystectomy.   SURGEON:  Dirk Dress. Katrinka Blazing, M.D.   DESCRIPTION OF PROCEDURE:  Under general endotracheal anesthesia, the  patient's abdomen was prepped and draped in a sterile field.  A  supraumbilical incision was made.  The Veress needle was inserted  uneventfully.  Position was confirmed by the saline drop test and decrease  in the pressure.  Using a Visiport guide, a 10 mm port was placed  uneventfully.  Laparoscope was placed.  Under videoscopic guidance, a 10 mm  port and two 5 mm ports were placed in the right upper quadrant.  The  gallbladder was grasped then positioned.  Cystic duct was dissected, clipped  with five clips and divided.  There were two cystic artery branches.  Each  was dissected, clipped with four clips and divided.  Using electrocautery,  the gallbladder was separated from the intrahepatic space without  difficulty.  Copious irrigation was carried out.  Hemostasis was achieved in  the gallbladder bed.  There was no evidence of bile leak from the bed.  The  gallbladder was grasped and retrieved along with the stones.  Further  inspection of the bed and the right upper quadrant revealed no abnormality.  There were some adhesions to the pelvis from recent hysterectomy.  The  patient tolerated the procedure well.  CO2 was allowed to escape from the  abdomen.  The ports of the larger incisions were closed with 0 Dexon.  The  skin was closed with  staples.  OpSite dressings were placed.  She was  awakened from anesthesia uneventfully, transferred to her bed, and taken to  the postanesthesia care unit.                                               Dirk Dress. Katrinka Blazing, M.D.    LCS/MEDQ  D:  04/07/2002  T:  04/07/2002  Job:  [id number]

## 2010-11-10 ENCOUNTER — Encounter: Payer: Self-pay | Admitting: Family Medicine

## 2010-11-11 ENCOUNTER — Encounter: Payer: Self-pay | Admitting: Family Medicine

## 2010-11-11 ENCOUNTER — Ambulatory Visit (INDEPENDENT_AMBULATORY_CARE_PROVIDER_SITE_OTHER): Payer: 59 | Admitting: Family Medicine

## 2010-11-11 ENCOUNTER — Encounter: Payer: Self-pay | Admitting: *Deleted

## 2010-11-11 VITALS — BP 120/84 | HR 51 | Resp 16 | Ht 68.0 in | Wt 303.1 lb

## 2010-11-11 DIAGNOSIS — R51 Headache: Secondary | ICD-10-CM

## 2010-11-11 DIAGNOSIS — E669 Obesity, unspecified: Secondary | ICD-10-CM

## 2010-11-11 MED ORDER — KETOROLAC TROMETHAMINE 60 MG/2ML IM SOLN
60.0000 mg | Freq: Once | INTRAMUSCULAR | Status: AC
  Administered 2010-11-11: 60 mg via INTRAMUSCULAR

## 2010-11-11 MED ORDER — METHYLPREDNISOLONE ACETATE 80 MG/ML IJ SUSP
80.0000 mg | Freq: Once | INTRAMUSCULAR | Status: AC
  Administered 2010-11-11: 80 mg via INTRAMUSCULAR

## 2010-11-11 NOTE — Progress Notes (Signed)
  Subjective:    Patient ID: Shannon Roth, female    DOB: [birth year], 45 y.o.   MRN: [id number]  HPI 2 day h/o generalized throbbiong headache, her classical migraine, denies focal deficits, nausea or vomiting. Did not work today.Denies sinus symptoms, fever, chills or neck stiffness. She has been inconssitent in lifestyle change for weight loss and has gained weight    Review of Systems   Denies recent fever or chills. Denies sinus pressure, nasal congestion, ear pain or sore throat. Denies chest congestion, productive cough or wheezing. Denies chest pains, palpitations, paroxysmal nocturnal dyspnea, orthopnea and leg swelling Denies abdominal pain, nausea, vomiting,diarrhea or constipation.  Denies dysuria, frequency, hesitancy or incontinence. Denies joint pain, swelling and limitation in mobility. Denies seizure, numbness, or tingling. Denies depression, anxiety or insomnia. Denies skin break down or rash.     Objective:   Physical Exam Patient alert and oriented and in no Cardiopulmonary distress.  HEENT: No facial asymmetry, EOMI, no sinus tenderness, TM's clear, Oropharynx pink and moist.  Neck supple no adenopathy.  Chest: Clear to auscultation bilaterally.  CVS: S1, S2 no murmurs, no S3.  ABD: Soft non tender. Bowel sounds normal.  Ext: No edema  MS: Adequate ROM spine, shoulders, hips and knees.  Skin: Intact, no ulcerations or rash noted.  Psych: Good eye contact, normal affect. Memory intact not anxious or depressed appearing.  CNS: CN 2-12 intact, power, tone and sensation normal throughout.        Assessment & Plan:

## 2010-11-11 NOTE — Assessment & Plan Note (Signed)
Deteriorated. Patient re-educated about  the importance of commitment to a  minimum of 150 minutes of exercise per week. The importance of healthy food choices with portion control discussed. Encouraged to start a food diary, count calories and to consider  joining a support group. Sample diet sheets offered. Goals set by the patient for the next several months.    

## 2010-11-11 NOTE — Patient Instructions (Addendum)
F/U in 4 months.  A healthy diet is rich in fruit, vegetables and whole grains. Poultry fish, nuts and beans are a healthy choice for protein rather then red meat. A low sodium diet and drinking 64 ounces of water daily is generally recommended. Oils and sweet should be limited. Carbohydrates especially for those who are diabetic or overweight, should be limited to 34-45 gram per meal. It is important to eat on a regular schedule, at least 3 times daily. Snacks should be primarily fruits, vegetables or nuts.  It is important that you exercise regularly at least 30 minutes 5 times a week. If you develop chest pain, have severe difficulty breathing, or feel very tired, stop exercising immediately and seek medical attention    Med today for headache, and work note for today to return in am

## 2010-11-11 NOTE — Assessment & Plan Note (Signed)
Deteriorated, currently having a migraine, toradol and depomedrol administered and work excuse to return in am

## 2010-11-12 ENCOUNTER — Telehealth: Payer: Self-pay | Admitting: Family Medicine

## 2010-11-12 NOTE — Telephone Encounter (Signed)
pls document in the phone note the extent of her symptoms and  extend the note/rewrite to cover today also, explain no furhter extension based on visit, after this one pls

## 2010-11-13 NOTE — Telephone Encounter (Signed)
Work note faxed to her job

## 2010-11-13 NOTE — Telephone Encounter (Signed)
Yesterday she still has the nagging headache but she feels some better today. She is at work today. Needs a note to cover yesterday.

## 2010-11-27 ENCOUNTER — Telehealth: Payer: Self-pay | Admitting: Family Medicine

## 2010-11-27 NOTE — Telephone Encounter (Signed)
Advised patient no appts available, advised to go to urgent care if she is in pain, otherwise she could call back to see if we have an opening but advised if she is in pain now she shouldn't wait

## 2010-12-24 ENCOUNTER — Ambulatory Visit (INDEPENDENT_AMBULATORY_CARE_PROVIDER_SITE_OTHER): Payer: 59 | Admitting: Family Medicine

## 2010-12-24 ENCOUNTER — Telehealth: Payer: Self-pay | Admitting: Family Medicine

## 2010-12-24 ENCOUNTER — Encounter: Payer: Self-pay | Admitting: *Deleted

## 2010-12-24 ENCOUNTER — Encounter: Payer: Self-pay | Admitting: Family Medicine

## 2010-12-24 VITALS — BP 110/80 | HR 92 | Resp 16 | Wt 308.0 lb

## 2010-12-24 DIAGNOSIS — H9201 Otalgia, right ear: Secondary | ICD-10-CM

## 2010-12-24 DIAGNOSIS — R51 Headache: Secondary | ICD-10-CM

## 2010-12-24 DIAGNOSIS — H9209 Otalgia, unspecified ear: Secondary | ICD-10-CM

## 2010-12-24 DIAGNOSIS — E669 Obesity, unspecified: Secondary | ICD-10-CM

## 2010-12-24 DIAGNOSIS — G43909 Migraine, unspecified, not intractable, without status migrainosus: Secondary | ICD-10-CM

## 2010-12-24 MED ORDER — KETOROLAC TROMETHAMINE 60 MG/2ML IM SOLN
60.0000 mg | Freq: Once | INTRAMUSCULAR | Status: AC
  Administered 2010-12-24: 60 mg via INTRAMUSCULAR

## 2010-12-24 MED ORDER — SULFAMETHOXAZOLE-TRIMETHOPRIM 400-80 MG PO TABS: 1.0000 | ORAL_TABLET | Freq: Two times a day (BID) | ORAL | Status: AC

## 2010-12-24 NOTE — Patient Instructions (Addendum)
F/u in 3 months.  I will attempt to get an appt with ENT sooner than the 30th, we will let you know.  toradol injection today for headache  Antibiotic for 7 days.  Work excuse to return in 2 days, 12/26/2010

## 2010-12-24 NOTE — Telephone Encounter (Signed)
Patient aware med was not sent due to codiene allergy

## 2010-12-24 NOTE — Progress Notes (Signed)
  Subjective:    Patient ID: Shannon Roth, female    DOB: January 23, 1966, 45 y.o.   MRN: [id number]  HPI 1 month h/o right ear pain, worse in the past 3 days, states PA at wk told her she had a mass in the ear.She denies hearing loss, or drainage from the ear. She has also been experiencing right sided headache in the past 3 days, associated with nausea and photophobia. She denies vision disturbance, numbness or weakness. She has had no recent fever or chills, sinus pressure , sore throat , cough   Review of Systems Denies recent fever or chills. Denies sinus pressure, nasal congestion,  or sore throat. Denies chest congestion, productive cough or wheezing. Denies chest pains, palpitations, paroxysmal nocturnal dyspnea, orthopnea and leg swelling Denies abdominal pain, nausea, vomiting,diarrhea or constipation.  . Denies dysuria, frequency, hesitancy or incontinence. Denies joint pain, swelling and limitation in mobility. Denies skin break down or rash.        Objective:   Physical Exam Patient alert and oriented and in no Cardiopulmonary distress.  HEENT: No facial asymmetry, EOMI, no sinus tenderness, right tM dull, Oropharynx pink and moist.  Neck supple no adenopathy.  Chest: Clear to auscultation bilaterally.  CVS: S1, S2 no murmurs, no S3.  ABD: Soft non tender. Bowel sounds normal.  Ext: No edema  MS: Adequate ROM spine, shoulders, hips and knees.  Skin: Intact, no ulcerations or rash noted.  Psych: Good eye contact, normal affect. Memory intact not anxious or depressed appearing.  CNS: CN 2-12 intact, power, tone and sensation normal throughout.Fundoscopy; no hemorage noted         Assessment & Plan:

## 2010-12-24 NOTE — Telephone Encounter (Signed)
noted 

## 2010-12-24 NOTE — Telephone Encounter (Signed)
Patient coming in today  

## 2010-12-25 ENCOUNTER — Ambulatory Visit (INDEPENDENT_AMBULATORY_CARE_PROVIDER_SITE_OTHER): Payer: 59 | Admitting: Otolaryngology

## 2010-12-25 DIAGNOSIS — H9209 Otalgia, unspecified ear: Secondary | ICD-10-CM

## 2010-12-25 DIAGNOSIS — H6120 Impacted cerumen, unspecified ear: Secondary | ICD-10-CM

## 2010-12-29 ENCOUNTER — Encounter: Payer: Self-pay | Admitting: Family Medicine

## 2010-12-29 NOTE — Assessment & Plan Note (Signed)
Uncontrolled, acute episode, work excuse and toradol, may be aggravated this time by pathology in ear , will await ENT eval

## 2010-12-29 NOTE — Assessment & Plan Note (Addendum)
Disabling, will refer for ENT eval asap, antibiotic prescribed in the event of smoldering infection, TM is dull

## 2010-12-29 NOTE — Assessment & Plan Note (Signed)
Deteriorated. Patient re-educated about  the importance of commitment to a  minimum of 150 minutes of exercise per week. The importance of healthy food choices with portion control discussed. Encouraged to start a food diary, count calories and to consider  joining a support group. Sample diet sheets offered. Goals set by the patient for the next several months.    

## 2011-03-17 ENCOUNTER — Encounter: Payer: Self-pay | Admitting: Family Medicine

## 2011-03-19 ENCOUNTER — Ambulatory Visit: Payer: 59 | Admitting: Family Medicine

## 2011-05-21 ENCOUNTER — Telehealth: Payer: Self-pay | Admitting: Family Medicine

## 2011-05-21 NOTE — Telephone Encounter (Signed)
Salt water gargles , voice rest and tylenol or ibuprofen, if worsens then urgent care

## 2011-05-21 NOTE — Telephone Encounter (Signed)
Notified pt of dr wishes.

## 2011-05-25 ENCOUNTER — Telehealth: Payer: Self-pay | Admitting: Family Medicine

## 2011-05-25 NOTE — Telephone Encounter (Signed)
Noted, after discussing further with staff member,Luann, advised that pt be offered appt  In the morning. She stated that pt had been offered an appointment today and Ms. Schulenburg declined , stating that she was feeling better

## 2011-07-31 ENCOUNTER — Other Ambulatory Visit: Payer: Self-pay | Admitting: Family Medicine

## 2011-09-28 ENCOUNTER — Ambulatory Visit (INDEPENDENT_AMBULATORY_CARE_PROVIDER_SITE_OTHER): Payer: BC Managed Care – PPO | Admitting: Family Medicine

## 2011-09-28 ENCOUNTER — Encounter: Payer: Self-pay | Admitting: Family Medicine

## 2011-09-28 VITALS — BP 120/80 | HR 79 | Resp 15 | Ht 68.0 in | Wt 317.8 lb

## 2011-09-28 DIAGNOSIS — Z13220 Encounter for screening for lipoid disorders: Secondary | ICD-10-CM

## 2011-09-28 DIAGNOSIS — E8881 Metabolic syndrome: Secondary | ICD-10-CM

## 2011-09-28 DIAGNOSIS — M899 Disorder of bone, unspecified: Secondary | ICD-10-CM

## 2011-09-28 DIAGNOSIS — R7301 Impaired fasting glucose: Secondary | ICD-10-CM

## 2011-09-28 DIAGNOSIS — M949 Disorder of cartilage, unspecified: Secondary | ICD-10-CM

## 2011-09-28 DIAGNOSIS — G43909 Migraine, unspecified, not intractable, without status migrainosus: Secondary | ICD-10-CM

## 2011-09-28 MED ORDER — KETOROLAC TROMETHAMINE 60 MG/2ML IJ SOLN
60.0000 mg | Freq: Once | INTRAMUSCULAR | Status: AC
  Administered 2011-09-28: 60 mg via INTRAMUSCULAR

## 2011-09-28 MED ORDER — IBUPROFEN 800 MG PO TABS: 800.0000 mg | ORAL_TABLET | Freq: Three times a day (TID) | ORAL | Status: AC | PRN

## 2011-09-28 MED ORDER — ELETRIPTAN HYDROBROMIDE 40 MG PO TABS: 40.0000 mg | ORAL_TABLET | ORAL | Status: DC | PRN

## 2011-09-28 MED ORDER — METHYLPREDNISOLONE ACETATE 80 MG/ML IJ SUSP
80.0000 mg | Freq: Once | INTRAMUSCULAR | Status: AC
  Administered 2011-09-28: 80 mg via INTRAMUSCULAR

## 2011-09-28 NOTE — Patient Instructions (Signed)
F/u in 2 month.  You are referred to Dr Gerilyn Pilgrim re disabling headaches.  Toradol 60mg  and depo medrol 80 mg iM are administered at office visit. Ibuprofen and relpax are prescribed.. Continue daily topamax. Work excuse from 04/22 to returm 04 /24/2013  Fasting labs asap pls cbc, chem 7, lipid, TSH and HBA1C, and vit D, these can be done at your worklplace  Mammogram is past due we will please schedule

## 2011-09-28 NOTE — Progress Notes (Signed)
  Subjective:    Patient ID: Shannon Roth, female    DOB: [birth year], 46 y.o.   MRN: [id number]  HPI 4 day h/o pounding headache with nausea and photophobia, no response to ibuprofen, her typical migraine type headache.Denies weakness or numbness, needs to r establish with neurology since headaches are uncontrolled, too frequent and seveere   Review of Systems See HPI Denies recent fever or chills. Denies sinus pressure, nasal congestion, ear pain or sore throat. Denies chest congestion, productive cough or wheezing. Denies chest pains, palpitations and leg swelling Denies abdominal pain, nausea, vomiting,diarrhea or constipation.   Denies dysuria, frequency, hesitancy or incontinence. Denies joint pain, swelling and limitation in mobility. Denies depression, anxiety or insomnia. Denies skin break down or rash.        Objective:   Physical Exam Patient alert and oriented and in no cardiopulmonary distress.Pt inpain  HEENT: No facial asymmetry, EOMI, no sinus tenderness,  oropharynx pink and moist.  Neck supple no adenopathy.  Chest: Clear to auscultation bilaterally.  CVS: S1, S2 no murmurs, no S3.  ABD: Soft non tender. Bowel sounds normal.  Ext: No edema  MS: Adequate ROM spine, shoulders, hips and knees.  Skin: Intact, no ulcerations or rash noted.  Psych: Good eye contact, normal affect. Memory intact not anxious or depressed appearing.  CNS: CN 2-12 intact, power, tone and sensation normal throughout.        Assessment & Plan:

## 2011-09-29 ENCOUNTER — Telehealth: Payer: Self-pay | Admitting: Family Medicine

## 2011-09-29 ENCOUNTER — Other Ambulatory Visit: Payer: Self-pay

## 2011-09-29 MED ORDER — CYCLOBENZAPRINE HCL 10 MG PO TABS: ORAL_TABLET | ORAL | Status: DC

## 2011-09-29 MED ORDER — TOPIRAMATE 100 MG PO TABS: 100.0000 mg | ORAL_TABLET | Freq: Every day | ORAL | Status: DC

## 2011-09-29 NOTE — Telephone Encounter (Signed)
Called and left message for pt to return call.  

## 2011-09-29 NOTE — Telephone Encounter (Signed)
Spoke with patient, no additional medication prescribed

## 2011-09-29 NOTE — Telephone Encounter (Signed)
Pt called stating that she was offered another pain medicine that is similar to the injection but it has not been sent in.  Pt does not know the name of the medication.  She may be referring to tramadol.

## 2011-10-21 ENCOUNTER — Telehealth: Payer: Self-pay | Admitting: Family Medicine

## 2011-10-21 DIAGNOSIS — E8881 Metabolic syndrome: Secondary | ICD-10-CM | POA: Insufficient documentation

## 2011-10-21 NOTE — Assessment & Plan Note (Signed)
The importance of lifestyle hange to improve health and ;lose weight is stressed, labs are past due

## 2011-10-21 NOTE — Assessment & Plan Note (Signed)
Uncontrolled, acute headache toradol and depo medrol in office and work excuse. Meds to harmacy

## 2011-10-21 NOTE — Telephone Encounter (Signed)
pls schedule mammogram for pt if she has not already had one scheduled and let her know, past due

## 2011-10-27 ENCOUNTER — Other Ambulatory Visit: Payer: Self-pay | Admitting: Family Medicine

## 2011-10-27 DIAGNOSIS — Z139 Encounter for screening, unspecified: Secondary | ICD-10-CM

## 2011-10-27 NOTE — Telephone Encounter (Signed)
[facility]t [facility] 11/03/2011 10:30. Pt is aware

## 2011-11-03 ENCOUNTER — Ambulatory Visit (HOSPITAL_COMMUNITY)
Admission: RE | Admit: 2011-11-03 | Discharge: 2011-11-03 | Disposition: A | Discharge status: Home / Self Care | Payer: BC Managed Care – PPO | Source: Ambulatory Visit | Attending: Family Medicine | Admitting: Family Medicine

## 2011-11-03 DIAGNOSIS — Z139 Encounter for screening, unspecified: Secondary | ICD-10-CM

## 2011-11-03 DIAGNOSIS — Z1231 Encounter for screening mammogram for malignant neoplasm of breast: Secondary | ICD-10-CM | POA: Insufficient documentation

## 2011-11-23 ENCOUNTER — Ambulatory Visit (INDEPENDENT_AMBULATORY_CARE_PROVIDER_SITE_OTHER): Payer: BC Managed Care – PPO | Admitting: Family Medicine

## 2011-11-23 ENCOUNTER — Encounter: Payer: Self-pay | Admitting: Family Medicine

## 2011-11-23 VITALS — BP 122/88 | HR 58 | Resp 16 | Ht 68.0 in | Wt 309.1 lb

## 2011-11-23 DIAGNOSIS — E669 Obesity, unspecified: Secondary | ICD-10-CM

## 2011-11-23 DIAGNOSIS — Z6379 Other stressful life events affecting family and household: Secondary | ICD-10-CM

## 2011-11-23 DIAGNOSIS — G43909 Migraine, unspecified, not intractable, without status migrainosus: Secondary | ICD-10-CM

## 2011-11-23 DIAGNOSIS — R110 Nausea: Secondary | ICD-10-CM

## 2011-11-23 DIAGNOSIS — E8881 Metabolic syndrome: Secondary | ICD-10-CM

## 2011-11-23 MED ORDER — METHYLPREDNISOLONE ACETATE 80 MG/ML IJ SUSP
80.0000 mg | Freq: Once | INTRAMUSCULAR | Status: AC
  Administered 2011-11-23: 80 mg via INTRAMUSCULAR

## 2011-11-23 MED ORDER — KETOROLAC TROMETHAMINE 60 MG/2ML IM SOLN
60.0000 mg | Freq: Once | INTRAMUSCULAR | Status: AC
  Administered 2011-11-23: 60 mg via INTRAMUSCULAR

## 2011-11-23 MED ORDER — ONDANSETRON HCL 4 MG/2ML IJ SOLN
4.0000 mg | Freq: Once | INTRAMUSCULAR | Status: AC
  Administered 2011-11-23: 4 mg via INTRAMUSCULAR

## 2011-11-23 NOTE — Progress Notes (Signed)
  Subjective:    Patient ID: Shannon Roth, female    DOB: Nov 13, 1965, 46 y.o.   MRN: [id number]  HPI Pt in c/o severe generalized headache since past weekend when she ended up with the responsibility of caring for her Mom unexpectedly. There has been a great deal of family disruption and argument following her Mom's recent illness which is ongoing and is currently being sorted out. Pt reports increased frequency and severity of headaches since the illness started , and is incapable of working today due to symptoms. Still has to keep appt with neurology.Denies blurred vision or localized weakness or numbness   Review of Systems See HPI Denies recent fever or chills. Denies sinus pressure, nasal congestion, ear pain or sore throat. Denies chest congestion, productive cough or wheezing. Denies chest pains, palpitations and leg swelling Denies abdominal pain, nausea, vomiting,diarrhea or constipation.   Denies dysuria, frequency, hesitancy or incontinence. Denies joint pain, swelling and limitation in mobility. Denies headaches, seizures, numbness, or tingling. Denies depression, anxiety or insomnia. Denies skin break down or rash.        Objective:   Physical Exam  Patient alert and oriented and in no cardiopulmonary distress.  HEENT: No facial asymmetry, EOMI, no sinus tenderness,  oropharynx pink and moist.  Neck supple no adenopathy.  Chest: Clear to auscultation bilaterally.  CVS: S1, S2 no murmurs, no S3.  ABD: Soft non tender. Bowel sounds normal.  Ext: No edema  MS: Adequate ROM spine, shoulders, hips and knees.  Skin: Intact, no ulcerations or rash noted.  Psych: Good eye contact, normal affect. Memory intact not anxious or depressed appearing.  CNS: CN 2-12 intact, power, tone and sensation normal throughout.       Assessment & Plan:

## 2011-11-23 NOTE — Patient Instructions (Addendum)
CPE in 3 month, call if you need me before  Toradol 60mg  , Depo medrol 80 mg and zofran 4mg  in the office today for headache and nausea.  Work excuse from 6/17 to return 11/24/2011  .It is important that you exercise regularly at least 30 minutes 5 times a week. If you develop chest pain, have severe difficulty breathing, or feel very tired, stop exercising immediately and seek medical attention  A healthy diet is rich in fruit, vegetables and whole grains. Poultry fish, nuts and beans are a healthy choice for protein rather then red meat. A low sodium diet and drinking 64 ounces of water daily is generally recommended. Oils and sweet should be limited. Carbohydrates especially for those who are diabetic or overweight, should be limited to 30-45 gram per meal. It is important to eat on a regular schedule, at least 3 times daily. Snacks should be primarily fruits, vegetables or nuts.  Please get therapy through your job   Please start one multivitamin with iron one tablet once daily

## 2011-11-30 ENCOUNTER — Ambulatory Visit: Payer: Self-pay | Admitting: Family Medicine

## 2011-12-08 DIAGNOSIS — Z6379 Other stressful life events affecting family and household: Secondary | ICD-10-CM | POA: Insufficient documentation

## 2011-12-08 NOTE — Assessment & Plan Note (Signed)
Deteriorated. Patient re-educated about  the importance of commitment to a  minimum of 150 minutes of exercise per week. The importance of healthy food choices with portion control discussed. Encouraged to start a food diary, count calories and to consider  joining a support group. Sample diet sheets offered. Goals set by the patient for the next several months.    

## 2011-12-08 NOTE — Assessment & Plan Note (Signed)
Importance of low carb diet with regular exercise and weight loss stressed to reduce risk of becoming diabetic

## 2011-12-08 NOTE — Assessment & Plan Note (Signed)
Uncontrolled, injection in office, work excuse and pt encouraged to keep appt with neurology

## 2011-12-08 NOTE — Assessment & Plan Note (Signed)
Pt verbalized the issues associated with family stress and is encouraged to seek therapy through her job, this is contributing to her current headache

## 2012-01-04 ENCOUNTER — Ambulatory Visit (INDEPENDENT_AMBULATORY_CARE_PROVIDER_SITE_OTHER): Payer: BC Managed Care – PPO | Admitting: Family Medicine

## 2012-01-04 ENCOUNTER — Encounter: Payer: Self-pay | Admitting: Family Medicine

## 2012-01-04 ENCOUNTER — Ambulatory Visit (HOSPITAL_COMMUNITY)
Admission: RE | Admit: 2012-01-04 | Discharge: 2012-01-04 | Disposition: A | Discharge status: Home / Self Care | Payer: BC Managed Care – PPO | Source: Ambulatory Visit | Attending: Family Medicine | Admitting: Family Medicine

## 2012-01-04 VITALS — BP 130/82 | HR 90 | Resp 16 | Ht 68.0 in | Wt 316.0 lb

## 2012-01-04 DIAGNOSIS — E669 Obesity, unspecified: Secondary | ICD-10-CM

## 2012-01-04 DIAGNOSIS — G43909 Migraine, unspecified, not intractable, without status migrainosus: Secondary | ICD-10-CM

## 2012-01-04 DIAGNOSIS — J42 Unspecified chronic bronchitis: Secondary | ICD-10-CM

## 2012-01-04 DIAGNOSIS — R05 Cough: Secondary | ICD-10-CM | POA: Insufficient documentation

## 2012-01-04 DIAGNOSIS — R059 Cough, unspecified: Secondary | ICD-10-CM | POA: Insufficient documentation

## 2012-01-04 MED ORDER — ALBUTEROL SULFATE (2.5 MG/3ML) 0.083% IN NEBU
2.5000 mg | INHALATION_SOLUTION | Freq: Once | RESPIRATORY_TRACT | Status: AC
  Administered 2012-01-05: 2.5 mg via RESPIRATORY_TRACT

## 2012-01-04 MED ORDER — PROMETHAZINE-DM 6.25-15 MG/5ML PO SYRP: ORAL_SOLUTION | ORAL | Status: DC

## 2012-01-04 MED ORDER — ALBUTEROL SULFATE HFA 108 (90 BASE) MCG/ACT IN AERS: 2.0000 | INHALATION_SPRAY | Freq: Four times a day (QID) | RESPIRATORY_TRACT | Status: DC | PRN

## 2012-01-04 MED ORDER — IPRATROPIUM BROMIDE 0.02 % IN SOLN
0.5000 mg | Freq: Once | RESPIRATORY_TRACT | Status: AC
  Administered 2012-01-05: 0.5 mg via RESPIRATORY_TRACT

## 2012-01-04 MED ORDER — PREDNISONE (PAK) 5 MG PO TABS: 5.0000 mg | ORAL_TABLET | ORAL | Status: DC

## 2012-01-04 MED ORDER — CLARITHROMYCIN 500 MG PO TABS: 500.0000 mg | ORAL_TABLET | Freq: Two times a day (BID) | ORAL | Status: AC

## 2012-01-04 NOTE — Patient Instructions (Addendum)
cPE in September , as before.  You are being treated for chronic bronchitis, neb treatment in office and 4 medications are sent to your pharmacy.  I hope that you feel better soon  Please get a chest xray today

## 2012-01-16 NOTE — Assessment & Plan Note (Signed)
1 month h/o cough treat agressively, and obtain cxr

## 2012-01-16 NOTE — Assessment & Plan Note (Signed)
Somewhat improved on medication, evaluation by headache clinic still recommended, as often presents in a   debilitated state

## 2012-01-16 NOTE — Progress Notes (Signed)
  Subjective:    Patient ID: Shannon Roth, female    DOB: Mar 26, 1966, 46 y.o.   MRN: [id number]  HPI C/o 1 month h/o chest congestion , cough and yellow sputum, intermittent chills, no fever documented. Denies sinus pressure, nasal discharge or sore throat. Headaches have improved on medication , but still needs to establish with neurologist. Significant family stress is a contributing factor   Review of Systems See HPI Denies recent fever or chills. D. Denies chest congestion, productive cough or wheezing. Denies chest pains, palpitations and leg swelling Denies abdominal pain, nausea, vomiting,diarrhea or constipation.   Denies dysuria, frequency, hesitancy or incontinence. Denies joint pain, swelling and limitation in mobility. Denies skin break down or rash.        Objective:   Physical Exam  Patient alert and oriented and in no cardiopulmonary distress.  HEENT: No facial asymmetry, EOMI, no sinus tenderness,  oropharynx pink and moist.  Neck supple no adenopathy.  Chest: decreased air entry, scattered crackles and wheezes.  CVS: S1, S2 no murmurs, no S3.  ABD: Soft non tender. Bowel sounds normal.  Ext: No edema  MS: Adequate ROM spine, shoulders, hips and knees.  Skin: Intact, no ulcerations or rash noted.  Psych: Good eye contact, normal affect. Memory intact not anxious or depressed appearing.  CNS: CN 2-12 intact, power, tone and sensation normal throughout.       Assessment & Plan:

## 2012-01-16 NOTE — Assessment & Plan Note (Signed)
Deteriorated. Patient re-educated about  the importance of commitment to a  minimum of 150 minutes of exercise per week. The importance of healthy food choices with portion control discussed. Encouraged to start a food diary, count calories and to consider  joining a support group. Sample diet sheets offered. Goals set by the patient for the next several months.    

## 2012-02-17 ENCOUNTER — Encounter: Payer: Self-pay | Admitting: Family Medicine

## 2012-02-17 ENCOUNTER — Ambulatory Visit (INDEPENDENT_AMBULATORY_CARE_PROVIDER_SITE_OTHER): Payer: BC Managed Care – PPO | Admitting: Family Medicine

## 2012-02-17 ENCOUNTER — Other Ambulatory Visit (HOSPITAL_COMMUNITY)
Admission: RE | Admit: 2012-02-17 | Discharge: 2012-02-17 | Disposition: A | Discharge status: Home / Self Care | Payer: BC Managed Care – PPO | Source: Ambulatory Visit | Attending: Family Medicine | Admitting: Family Medicine

## 2012-02-17 VITALS — BP 130/76 | HR 66 | Resp 18 | Ht 67.0 in | Wt 316.1 lb

## 2012-02-17 DIAGNOSIS — Z1211 Encounter for screening for malignant neoplasm of colon: Secondary | ICD-10-CM

## 2012-02-17 DIAGNOSIS — F419 Anxiety disorder, unspecified: Secondary | ICD-10-CM

## 2012-02-17 DIAGNOSIS — Z01419 Encounter for gynecological examination (general) (routine) without abnormal findings: Secondary | ICD-10-CM | POA: Insufficient documentation

## 2012-02-17 DIAGNOSIS — M899 Disorder of bone, unspecified: Secondary | ICD-10-CM

## 2012-02-17 DIAGNOSIS — Z124 Encounter for screening for malignant neoplasm of cervix: Secondary | ICD-10-CM

## 2012-02-17 DIAGNOSIS — Z0000 Encounter for general adult medical examination without abnormal findings: Secondary | ICD-10-CM

## 2012-02-17 DIAGNOSIS — E8881 Metabolic syndrome: Secondary | ICD-10-CM

## 2012-02-17 DIAGNOSIS — F411 Generalized anxiety disorder: Secondary | ICD-10-CM

## 2012-02-17 DIAGNOSIS — E669 Obesity, unspecified: Secondary | ICD-10-CM

## 2012-02-17 DIAGNOSIS — M949 Disorder of cartilage, unspecified: Secondary | ICD-10-CM

## 2012-02-17 DIAGNOSIS — Z13220 Encounter for screening for lipoid disorders: Secondary | ICD-10-CM

## 2012-02-17 DIAGNOSIS — R51 Headache: Secondary | ICD-10-CM

## 2012-02-17 DIAGNOSIS — R5381 Other malaise: Secondary | ICD-10-CM

## 2012-02-17 LAB — POC HEMOCCULT BLD/STL (OFFICE/1-CARD/DIAGNOSTIC): Fecal Occult Blood, POC: NEGATIVE

## 2012-02-17 MED ORDER — BUSPIRONE HCL 5 MG PO TABS: 5.0000 mg | ORAL_TABLET | Freq: Two times a day (BID) | ORAL | Status: DC

## 2012-02-17 NOTE — Assessment & Plan Note (Signed)
Pelvic and breast exam within normal. Pap sent  Dietary advise as well as the need for regular exercise discussed and encouraged to facilitate improved health and weight loss Immunization to be obtained through her job

## 2012-02-17 NOTE — Progress Notes (Signed)
  Subjective:    Patient ID: Shannon Roth, female    DOB: [birth year], 46 y.o.   MRN: [id number]  HPI The PT is here for annual exam and re-evaluation of chronic medical conditions, medication management and review of any available recent lab and radiology data.  Preventive health is updated, specifically  Cancer screening and Immunization.   Questions or concerns regarding consultations or procedures which the PT has had in the interim are  Addressed.She did not keep appt with neurologist as she had a $75 copay which she could not afford. The PT denies any adverse reactions to current medications since the last visit.  There are no new concerns.  Denies headaches except las Sat when she was with her parents who started arguing with each other as usual , reportedly, and this got her into a headache. Agrees that she tends to stay stressed, and is willing to take  medication to help with this so headaches will decrease      Review of Systems See HPIwe Denies recent fever or chills. Denies sinus pressure, nasal congestion, ear pain or sore throat. Denies chest congestion, productive cough or wheezing. Denies chest pains, palpitations and leg swelling Denies abdominal pain, nausea, vomiting,diarrhea or constipation.   Denies dysuria, frequency, hesitancy or incontinence. Denies joint pain, swelling and limitation in mobility. Denies seizures, numbness, or tingling. Denies depression,or insomnia. Denies skin break down or rash.        Objective:   Physical Exam Pleasant obese  female, alert and oriented x 3, in no cardio-pulmonary distress. Afebrile. HEENT No facial trauma or asymetry. Sinuses non tender.  EOMI, PERTL, fundoscopic  no hemorhage or exudate.  External ears normal, tympanic membranes clear. Oropharynx moist, no exudate, fair  dentition. Neck: supple, no adenopathy,JVD or thyromegaly.No bruits.  Chest: Clear to ascultation bilaterally.No crackles or wheezes. Non  tender to palpation  Breast: No asymetry,no masses. No nipple discharge or inversion. No axillary or supraclavicular adenopathy  Cardiovascular system; Heart sounds normal,  S1 and  S2 ,no S3.  No murmur, or thrill. Apical beat not displaced Peripheral pulses normal.  Abdomen: Soft, non tender, no organomegaly or masses. No bruits. Bowel sounds normal. No guarding, tenderness or rebound.  Rectal:  No mass. Guaiac negative stool.  GU: External genitalia normal. No lesions. Vaginal canal normal.No discharge. Uterus normal absent, no adnexal masses, no cervical motion or  adnexal tenderness.  Musculoskeletal exam: Full ROM of spine, hips , shoulders and knees. No deformity ,swelling or crepitus noted. No muscle wasting or atrophy.   Neurologic: Cranial nerves 2 to 12 intact. Power, tone ,sensation and reflexes normal throughout. No disturbance in gait. No tremor.  Skin: Intact, no ulceration, erythema , scaling or rash noted. Pigmentation normal throughout  Psych; Normal mood and affect. Judgement and concentration normal        Assessment & Plan:

## 2012-02-17 NOTE — Assessment & Plan Note (Signed)
Aggravated by stress, a lot of which is self inflicted, pt to start med for anxiety

## 2012-02-17 NOTE — Assessment & Plan Note (Signed)
Uncontrolled, de stress techniques discussed and pt to start buspar

## 2012-02-17 NOTE — Assessment & Plan Note (Signed)
Deteriorated. Patient re-educated about  the importance of commitment to a  minimum of 150 minutes of exercise per week. The importance of healthy food choices with portion control discussed. Encouraged to start a food diary, count calories and to consider  joining a support group. Sample diet sheets offered. Goals set by the patient for the next several months.    

## 2012-02-17 NOTE — Patient Instructions (Addendum)
F/u in 4 month, call if you need me before please.  You will start new medication for anxiety,buspar, take twice daily on schedule, this will reduce headache frequency  Fasting cbc, chem 7, lipid, tsh and vit D and HBAC in January before f/u  It is important that you exercise regularly at least 30 minutes 5 times a week. If you develop chest pain, have severe difficulty breathing, or feel very tired, stop exercising immediately and seek medical attention    A healthy diet is rich in fruit, vegetables and whole grains. Poultry fish, nuts and beans are a healthy choice for protein rather then red meat. A low sodium diet and drinking 64 ounces of water daily is generally recommended. Oils and sweet should be limited. Carbohydrates especially for those who are diabetic or overweight, should be limited to 30-45 gram per meal. It is important to eat on a regular schedule, at least 3 times daily. Snacks should be primarily fruits, vegetables or nuts.  You need TdaP and flu vaccine, please get this at your workplace

## 2012-03-04 ENCOUNTER — Telehealth: Payer: Self-pay | Admitting: Family Medicine

## 2012-03-04 DIAGNOSIS — J42 Unspecified chronic bronchitis: Secondary | ICD-10-CM

## 2012-03-04 MED ORDER — PROMETHAZINE-DM 6.25-15 MG/5ML PO SYRP: ORAL_SOLUTION | ORAL | Status: AC

## 2012-03-04 NOTE — Telephone Encounter (Signed)
Refilled x1 

## 2012-04-15 ENCOUNTER — Ambulatory Visit (INDEPENDENT_AMBULATORY_CARE_PROVIDER_SITE_OTHER): Payer: BC Managed Care – PPO | Admitting: Family Medicine

## 2012-04-15 ENCOUNTER — Encounter: Payer: Self-pay | Admitting: Family Medicine

## 2012-04-15 VITALS — BP 124/82 | HR 62 | Temp 99.3°F | Resp 16 | Ht 67.0 in | Wt 316.0 lb

## 2012-04-15 DIAGNOSIS — J0110 Acute frontal sinusitis, unspecified: Secondary | ICD-10-CM | POA: Insufficient documentation

## 2012-04-15 DIAGNOSIS — F411 Generalized anxiety disorder: Secondary | ICD-10-CM

## 2012-04-15 DIAGNOSIS — K5289 Other specified noninfective gastroenteritis and colitis: Secondary | ICD-10-CM

## 2012-04-15 DIAGNOSIS — K529 Noninfective gastroenteritis and colitis, unspecified: Secondary | ICD-10-CM | POA: Insufficient documentation

## 2012-04-15 DIAGNOSIS — F419 Anxiety disorder, unspecified: Secondary | ICD-10-CM

## 2012-04-15 MED ORDER — KETOROLAC TROMETHAMINE 60 MG/2ML IJ SOLN
60.0000 mg | Freq: Once | INTRAMUSCULAR | Status: AC
  Administered 2012-04-15: 60 mg via INTRAMUSCULAR

## 2012-04-15 MED ORDER — SULFAMETHOXAZOLE-TRIMETHOPRIM 800-160 MG PO TABS: 1.0000 | ORAL_TABLET | Freq: Two times a day (BID) | ORAL | Status: AC

## 2012-04-15 MED ORDER — FLUCONAZOLE 150 MG PO TABS: ORAL_TABLET | ORAL | Status: DC

## 2012-04-15 MED ORDER — TOPIRAMATE 100 MG PO TABS: 100.0000 mg | ORAL_TABLET | Freq: Every day | ORAL | Status: DC

## 2012-04-15 MED ORDER — BUSPIRONE HCL 5 MG PO TABS: 5.0000 mg | ORAL_TABLET | Freq: Two times a day (BID) | ORAL | Status: DC

## 2012-04-15 MED ORDER — ONDANSETRON HCL 4 MG/2ML IJ SOLN
4.0000 mg | Freq: Once | INTRAMUSCULAR | Status: AC
  Administered 2012-04-15: 4 mg via INTRAMUSCULAR

## 2012-04-15 MED ORDER — DIPHENOXYLATE-ATROPINE 2.5-0.025 MG PO TABS: 1.0000 | ORAL_TABLET | Freq: Four times a day (QID) | ORAL | Status: DC | PRN

## 2012-04-15 MED ORDER — PROMETHAZINE HCL 25 MG PO TABS: 25.0000 mg | ORAL_TABLET | Freq: Three times a day (TID) | ORAL | Status: DC | PRN

## 2012-04-15 NOTE — Assessment & Plan Note (Signed)
Improved, no med change 

## 2012-04-15 NOTE — Assessment & Plan Note (Signed)
Acute vomiting and diareah zofran in office and symptomatic meds sent to the pharmacy

## 2012-04-15 NOTE — Progress Notes (Signed)
  Subjective:    Patient ID: Shannon Roth, female    DOB: May 20, 1966, 46 y.o.   MRN: [id number]  HPI 3 day h/o head /frontal pressure with green drainage, generalizd body aches, no cough or sore throat. Vomit x 3 today and 1 loose stool exposed at work , she believes.Chills, no documented fever Prior to this c/o generalized increase in stress both at home and at work    Review of Systems See HPI Denies  nasal congestion, ear pain or sore throat. Denies chest congestion, productive cough or wheezing. Denies chest pains, palpitations and leg swelling   Denies dysuria, frequency, hesitancy or incontinence. Denies joint pain, swelling and limitation in mobility. Denies  seizures, numbness, or tingling. Denies depression c/o , anxiety or insomnia. Denies skin break down or rash.        Objective:   Physical Exam  Patient alert and oriented and in no cardiopulmonary distress.Ill appearing  HEENT: No facial asymmetry, EOMI, frontal sinus tenderness,  oropharynx pink and moist.  Neck supple no adenopathy.  Chest: Clear to auscultation bilaterally.  CVS: S1, S2 no murmurs, no S3.  ABD: Soft non tender. Bowel sounds hyperactive Ext: No edema  MS: Adequate ROM spine, shoulders, hips and knees.  Skin: Intact, no ulcerations or rash noted.  Psych: Good eye contact, normal affect. Memory intact not anxious or depressed appearing.  CNS: CN 2-12 intact, power, tone and sensation normal throughout.       Assessment & Plan:

## 2012-04-15 NOTE — Patient Instructions (Addendum)
F/u as before.  You will Toradol 60mg  IM in office for headache and body aches.  You will get Zofran 4mg  Im in office for nausea  You are treated for acute frontal sinusitis, headache which is uncontrolled, and acute gastroenteritis, medications are sent to your pharmacy.  Work excuse from 11/07 to return 04/18/2012  You will get printed information on gastroenteritis, good hand hygiene is important ion preventing the spread of the illness    Viral Gastroenteritis Viral gastroenteritis is also known as stomach flu. This condition affects the stomach and intestinal tract. It can cause sudden diarrhea and vomiting. The illness typically lasts 3 to 8 days. Most people develop an immune response that eventually gets rid of the virus. While this natural response develops, the virus can make you quite ill. CAUSES  Many different viruses can cause gastroenteritis, such as rotavirus or noroviruses. You can catch one of these viruses by consuming contaminated food or water. You may also catch a virus by sharing utensils or other personal items with an infected person or by touching a contaminated surface. SYMPTOMS  The most common symptoms are diarrhea and vomiting. These problems can cause a severe loss of body fluids (dehydration) and a body salt (electrolyte) imbalance. Other symptoms may include:  Fever.  Headache.  Fatigue.  Abdominal pain. DIAGNOSIS  Your caregiver can usually diagnose viral gastroenteritis based on your symptoms and a physical exam. A stool sample may also be taken to test for the presence of viruses or other infections. TREATMENT  This illness typically goes away on its own. Treatments are aimed at rehydration. The most serious cases of viral gastroenteritis involve vomiting so severely that you are not able to keep fluids down. In these cases, fluids must be given through an intravenous line (IV). HOME CARE INSTRUCTIONS   Drink enough fluids to keep your urine clear  or pale yellow. Drink small amounts of fluids frequently and increase the amounts as tolerated.  Ask your caregiver for specific rehydration instructions.  Avoid:  Foods high in sugar.  Alcohol.  Carbonated drinks.  Tobacco.  Juice.  Caffeine drinks.  Extremely hot or cold fluids.  Fatty, greasy foods.  Too much intake of anything at one time.  Dairy products until 24 to 48 hours after diarrhea stops.  You may consume probiotics. Probiotics are active cultures of beneficial bacteria. They may lessen the amount and number of diarrheal stools in adults. Probiotics can be found in yogurt with active cultures and in supplements.  Wash your hands well to avoid spreading the virus.  Only take over-the-counter or prescription medicines for pain, discomfort, or fever as directed by your caregiver. Do not give aspirin to children. Antidiarrheal medicines are not recommended.  Ask your caregiver if you should continue to take your regular prescribed and over-the-counter medicines.  Keep all follow-up appointments as directed by your caregiver. SEEK IMMEDIATE MEDICAL CARE IF:   You are unable to keep fluids down.  You do not urinate at least once every 6 to 8 hours.  You develop shortness of breath.  You notice blood in your stool or vomit. This may look like coffee grounds.  You have abdominal pain that increases or is concentrated in one small area (localized).  You have persistent vomiting or diarrhea.  You have a fever.  The patient is a child younger than 3 months, and he or she has a fever.  The patient is a child older than 3 months, and he or she has  a fever and persistent symptoms.  The patient is a child older than 3 months, and he or she has a fever and symptoms suddenly get worse.  The patient is a baby, and he or she has no tears when crying. MAKE SURE YOU:   Understand these instructions.  Will watch your condition.  Will get help right away if you  are not doing well or get worse. Document Released: 05/25/2005 Document Revised: 08/17/2011 Document Reviewed: 03/11/2011 [facility] Patient Information 2013 [place], [place].

## 2012-04-25 ENCOUNTER — Telehealth: Payer: Self-pay | Admitting: Family Medicine

## 2012-05-10 ENCOUNTER — Other Ambulatory Visit: Payer: Self-pay | Admitting: Family Medicine

## 2012-05-10 ENCOUNTER — Telehealth: Payer: Self-pay | Admitting: Family Medicine

## 2012-05-10 DIAGNOSIS — R058 Other specified cough: Secondary | ICD-10-CM

## 2012-05-10 DIAGNOSIS — R05 Cough: Secondary | ICD-10-CM

## 2012-05-10 NOTE — Telephone Encounter (Signed)
pls Advise her to get a cxr , I will enter and submit sputum for c/s, ok for her to take mucinex one twice daily for approx 5 days and drink a lot of fluids

## 2012-05-10 NOTE — Telephone Encounter (Signed)
Started last wed and she has been coughing and producing dark colored phlegm and some fever. Hurts in chest when she coughs. Advised it needed an appt but she was unable to get off work because they were short staffed. Wanted to see if there was anything you could do

## 2012-05-10 NOTE — Telephone Encounter (Signed)
Any further recommendations?

## 2012-05-10 NOTE — Telephone Encounter (Signed)
Pt aware.

## 2012-05-10 NOTE — Telephone Encounter (Signed)
Is she coughing up mucus, fever or chil;ls , more info needed pls

## 2012-05-24 ENCOUNTER — Telehealth: Payer: Self-pay | Admitting: Family Medicine

## 2012-05-24 NOTE — Telephone Encounter (Signed)
pls get info from pt as to her specific symptoms so I can enter  Referral, let her know I am more than happy to refer

## 2012-05-25 ENCOUNTER — Other Ambulatory Visit: Payer: Self-pay | Admitting: Family Medicine

## 2012-05-25 DIAGNOSIS — R05 Cough: Secondary | ICD-10-CM

## 2012-05-25 DIAGNOSIS — R0600 Dyspnea, unspecified: Secondary | ICD-10-CM

## 2012-05-25 DIAGNOSIS — R059 Cough, unspecified: Secondary | ICD-10-CM

## 2012-05-25 NOTE — Telephone Encounter (Signed)
Pt has appt with dr. Delford Field and is aware of it

## 2012-05-25 NOTE — Telephone Encounter (Signed)
pls refer pt to Dr Delford Field pulm in [place], eval chronic cough x 6 month with shortness of breath and let her know

## 2012-05-25 NOTE — Telephone Encounter (Signed)
She has had a chronic cough since July with SOB. Not getting better

## 2012-05-27 ENCOUNTER — Encounter: Payer: Self-pay | Admitting: Critical Care Medicine

## 2012-05-27 ENCOUNTER — Ambulatory Visit (INDEPENDENT_AMBULATORY_CARE_PROVIDER_SITE_OTHER): Payer: BC Managed Care – PPO | Admitting: Critical Care Medicine

## 2012-05-27 VITALS — BP 122/88 | HR 75 | Temp 98.3°F | Ht 68.0 in | Wt 325.6 lb

## 2012-05-27 DIAGNOSIS — R059 Cough, unspecified: Secondary | ICD-10-CM

## 2012-05-27 DIAGNOSIS — E669 Obesity, unspecified: Secondary | ICD-10-CM | POA: Insufficient documentation

## 2012-05-27 DIAGNOSIS — G43909 Migraine, unspecified, not intractable, without status migrainosus: Secondary | ICD-10-CM | POA: Insufficient documentation

## 2012-05-27 DIAGNOSIS — R05 Cough: Secondary | ICD-10-CM

## 2012-05-27 MED ORDER — FLUTICASONE PROPIONATE 50 MCG/ACT NA SUSP: 2.0000 | Freq: Every day | NASAL | Status: DC

## 2012-05-27 MED ORDER — CHLORPHENIRAMINE TANNATE 12 MG PO TABS: ORAL_TABLET | ORAL | Status: DC

## 2012-05-27 MED ORDER — DEXTROMETHORPHAN POLISTIREX 30 MG/5ML PO LQCR: 60.0000 mg | ORAL | Status: DC | PRN

## 2012-05-27 MED ORDER — BENZONATATE 100 MG PO CAPS: 100.0000 mg | ORAL_CAPSULE | Freq: Three times a day (TID) | ORAL | Status: DC | PRN

## 2012-05-27 MED ORDER — OMEPRAZOLE 20 MG PO CPDR: 20.0000 mg | DELAYED_RELEASE_CAPSULE | Freq: Every day | ORAL | Status: DC

## 2012-05-27 MED ORDER — PREDNISONE 10 MG PO TABS: ORAL_TABLET | ORAL | Status: DC

## 2012-05-27 NOTE — Assessment & Plan Note (Signed)
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 No true asthma Plan Start cyclic cough protocol using Delsym/benzonatate Start chlorpheniramine 12mg  at bedtime Prednisone 10mg  Take 4 for two days three for two days two for two days one for two days Omeprazole one daily Follow reflux diet Flonase two puff ea nostril daily Return 1 month

## 2012-05-27 NOTE — Progress Notes (Signed)
Subjective:    Patient ID: Shannon Roth, female    DOB: [birth year], 46 y.o.   MRN: [id number]  HPI Comments: Chronic cough since 12/2011.  Also prior spells of cough episodic 95yrs.   Cough This is a recurrent problem. The current episode started more than 1 month ago. The problem has been waxing and waning. The problem occurs constantly. The cough is productive of sputum. Associated symptoms include chills, ear congestion, ear pain, a fever, headaches, heartburn, nasal congestion, postnasal drip, rhinorrhea, a sore throat, shortness of breath and wheezing. Pertinent negatives include no chest pain, hemoptysis, myalgias, rash, sweats or weight loss. Associated symptoms comments: hoarse. The symptoms are aggravated by cold air, exercise and lying down (cough night is severe). She has tried a beta-agonist inhaler and prescription cough suppressant for the symptoms. The treatment provided no relief. Her past medical history is significant for bronchitis. There is no history of asthma, bronchiectasis, COPD, emphysema, environmental allergies or pneumonia.   Past Medical History  Diagnosis Date  . Migraine   . Rectal bleeding   . Obesity   . Arthritis   . Cough      Family History  Problem Relation Age of Onset  . Emphysema Paternal Grandfather   . Emphysema Maternal Grandmother   . Asthma Mother   . Lung cancer Maternal Grandmother      History   Social History  . Marital Status: Single    Spouse Name: N/A    Number of Children: N/A  . Years of Education: N/A   Occupational History  . Social Services    Social History Main Topics  . Smoking status: Never Smoker   . Smokeless tobacco: Never Used  . Alcohol Use: No  . Drug Use: No  . Sexually Active: Not on file   Other Topics Concern  . Not on file   Social History Narrative  . No narrative on file     Allergies  Allergen Reactions  . Codeine   . Penicillins      Outpatient Prescriptions Prior to Visit   Medication Sig Dispense Refill  . cyclobenzaprine (FLEXERIL) 10 MG tablet One tablet twice daily , as needed for severe headache  40 tablet  1  . diphenoxylate-atropine (LOMOTIL) 2.5-0.025 MG per tablet Take 1 tablet by mouth 4 (four) times daily as needed for diarrhea or loose stools.  30 tablet  0  . eletriptan (RELPAX) 40 MG tablet One tablet by mouth at onset of headache. May repeat in 2 hours if headache persists or recurs. may repeat in 2 hours if necessary  10 tablet  1  . ibuprofen (ADVIL,MOTRIN) 800 MG tablet Take 1 tablet (800 mg total) by mouth every 8 (eight) hours as needed for pain.  36 tablet  4  . promethazine (PHENERGAN) 25 MG tablet Take 1 tablet (25 mg total) by mouth every 8 (eight) hours as needed for nausea.  30 tablet  0  . topiramate (TOPAMAX) 100 MG tablet Take 1 tablet (100 mg total) by mouth daily.  30 tablet  4  . [DISCONTINUED] busPIRone (BUSPAR) 5 MG tablet Take 1 tablet (5 mg total) by mouth 2 (two) times daily.  60 tablet  4  . [DISCONTINUED] albuterol (PROVENTIL HFA;VENTOLIN HFA) 108 (90 BASE) MCG/ACT inhaler Inhale 2 puffs into the lungs every 6 (six) hours as needed for wheezing.  18 g  0  . [DISCONTINUED] fluconazole (DIFLUCAN) 150 MG tablet One tablet once daily, as needed, for vaginal itch  2 tablet  0   Last reviewed on 05/27/2012  2:07 PM by Storm Frisk, MD    Review of Systems  Constitutional: Positive for fever, chills, diaphoresis, fatigue and unexpected weight change. Negative for weight loss, activity change and appetite change.  HENT: Positive for ear pain, congestion, sore throat, facial swelling, rhinorrhea, sneezing, trouble swallowing, neck pain, neck stiffness, voice change, postnasal drip and tinnitus. Negative for hearing loss, nosebleeds, mouth sores, dental problem, sinus pressure and ear discharge.   Eyes: Negative for photophobia, discharge, itching and visual disturbance.  Respiratory: Positive for cough, choking, chest tightness,  shortness of breath and wheezing. Negative for apnea, hemoptysis and stridor.   Cardiovascular: Negative for chest pain, palpitations and leg swelling.  Gastrointestinal: Positive for heartburn, nausea and vomiting. Negative for abdominal pain, constipation, blood in stool and abdominal distention.  Genitourinary: Positive for frequency. Negative for dysuria, urgency, hematuria, flank pain, decreased urine volume and difficulty urinating.  Musculoskeletal: Positive for back pain. Negative for myalgias, joint swelling, arthralgias and gait problem.  Skin: Negative for color change, pallor and rash.  Neurological: Positive for dizziness, light-headedness, numbness and headaches. Negative for tremors, seizures, syncope, speech difficulty and weakness.  Hematological: Negative for environmental allergies and adenopathy. Does not bruise/bleed easily.  Psychiatric/Behavioral: Positive for sleep disturbance and agitation. Negative for confusion. The patient is not nervous/anxious.        Objective:   Physical Exam Filed Vitals:   05/27/12 1348  BP: 122/88  Pulse: 75  Temp: 98.3 F (36.8 C)  TempSrc: Oral  Height: 5\' 8"  (1.727 m)  Weight: 325 lb 9.6 oz (147.691 kg)  SpO2: 100%    Gen: Pleasant, well-nourished, in no distress,  normal affect  ENT: No lesions,  mouth clear,  oropharynx clear, ++postnasal drip  Neck: No JVD, no TMG, no carotid bruits  Lungs: No use of accessory muscles, no dullness to percussion, clear without rales or rhonchi  Cardiovascular: RRR, heart sounds normal, no murmur or gallops, no peripheral edema  Abdomen: soft and NT, no HSM,  BS normal  Musculoskeletal: No deformities, no cyanosis or clubbing  Neuro: alert, non focal  Skin: Warm, no lesions or rashes  CXR reviewed: normal Spirometry 05/27/2012: normal         Assessment & Plan:   Cough 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 No true asthma Plan Start cyclic  cough protocol using Delsym/benzonatate Start chlorpheniramine 12mg  at bedtime Prednisone 10mg  Take 4 for two days three for two days two for two days one for two days Omeprazole one daily Follow reflux diet Flonase two puff ea nostril daily Return 1 month    Updated Medication List Outpatient Encounter Prescriptions as of 05/27/2012  Medication Sig Dispense Refill  . busPIRone (BUSPAR) 5 MG tablet Take 5 mg by mouth at bedtime as needed.      . cyclobenzaprine (FLEXERIL) 10 MG tablet One tablet twice daily , as needed for severe headache  40 tablet  1  . diphenoxylate-atropine (LOMOTIL) 2.5-0.025 MG per tablet Take 1 tablet by mouth 4 (four) times daily as needed for diarrhea or loose stools.  30 tablet  0  . eletriptan (RELPAX) 40 MG tablet One tablet by mouth at onset of headache. May repeat in 2 hours if headache persists or recurs. may repeat in 2 hours if necessary  10 tablet  1  . ibuprofen (ADVIL,MOTRIN) 800 MG tablet Take 1 tablet (800 mg total) by mouth every 8 (eight)  hours as needed for pain.  36 tablet  4  . promethazine (PHENERGAN) 25 MG tablet Take 1 tablet (25 mg total) by mouth every 8 (eight) hours as needed for nausea.  30 tablet  0  . topiramate (TOPAMAX) 100 MG tablet Take 1 tablet (100 mg total) by mouth daily.  30 tablet  4  . topiramate (TOPAMAX) 50 MG tablet Take 50 mg by mouth daily.      . traMADol (ULTRAM) 50 MG tablet as needed. For HA      . [DISCONTINUED] busPIRone (BUSPAR) 5 MG tablet Take 1 tablet (5 mg total) by mouth 2 (two) times daily.  60 tablet  4  . benzonatate (TESSALON) 100 MG capsule Take 1 capsule (100 mg total) by mouth 3 (three) times daily as needed for cough.  60 capsule  4  . Chlorpheniramine Tannate 12 MG TABS Take on at bedtime  30 each  4  . dextromethorphan (DELSYM) 30 MG/5ML liquid Take 10 mLs (60 mg total) by mouth as needed for cough.  150 mL  0  . fluticasone (FLONASE) 50 MCG/ACT nasal spray Place 2 sprays into the nose daily.  16 g   2  . omeprazole (PRILOSEC) 20 MG capsule Take 1 capsule (20 mg total) by mouth daily.  30 capsule  2  . predniSONE (DELTASONE) 10 MG tablet Take 4 for two days three for two days two for two days one for two days  20 tablet  0  . [DISCONTINUED] albuterol (PROVENTIL HFA;VENTOLIN HFA) 108 (90 BASE) MCG/ACT inhaler Inhale 2 puffs into the lungs every 6 (six) hours as needed for wheezing.  18 g  0  . [DISCONTINUED] fluconazole (DIFLUCAN) 150 MG tablet One tablet once daily, as needed, for vaginal itch  2 tablet  0

## 2012-05-27 NOTE — Patient Instructions (Addendum)
Start cyclic cough protocol using Delsym/benzonatate Start chlorpheniramine 12mg  at bedtime Prednisone 10mg  Take 4 for two days three for two days two for two days one for two days Omeprazole one daily Follow reflux diet Flonase two puff ea nostril daily Return 1 month

## 2012-06-17 ENCOUNTER — Ambulatory Visit: Payer: BC Managed Care – PPO | Admitting: Family Medicine

## 2012-06-29 ENCOUNTER — Encounter: Payer: Self-pay | Admitting: Critical Care Medicine

## 2012-06-29 ENCOUNTER — Ambulatory Visit (INDEPENDENT_AMBULATORY_CARE_PROVIDER_SITE_OTHER): Payer: BC Managed Care – PPO | Admitting: Critical Care Medicine

## 2012-06-29 VITALS — BP 122/82 | HR 68 | Temp 98.5°F | Ht 68.0 in | Wt 325.0 lb

## 2012-06-29 DIAGNOSIS — R059 Cough, unspecified: Secondary | ICD-10-CM

## 2012-06-29 DIAGNOSIS — R05 Cough: Secondary | ICD-10-CM

## 2012-06-29 NOTE — Patient Instructions (Addendum)
Reduce Delsym as needed Stop omeprazole when current bottle runs out Follow reflux diet Ok to use chlorpheniramine as needed Stay on flonase Return as needed

## 2012-06-29 NOTE — Progress Notes (Signed)
Subjective:    Patient ID: Shannon Roth, female    DOB: 1965-07-09, 47 y.o.   MRN: [id number]  HPI  06/29/2012 The patient is now improved with resolution of chronic cough. There's not much postnasal drainage. The patient states breathing is stable. There is no fever chills or sweats. There is decrease reflux symptoms. Overall patient is markedly better and did follow the treatment course. She maintains Flonase proton pump inhibitor and as needed antihistamine.  Past Medical History  Diagnosis Date  . Migraine   . Rectal bleeding   . Obesity   . Arthritis   . Cough      Family History  Problem Relation Age of Onset  . Emphysema Paternal Grandfather   . Emphysema Maternal Grandmother   . Asthma Mother   . Lung cancer Maternal Grandmother      History   Social History  . Marital Status: Single    Spouse Name: N/A    Number of Children: N/A  . Years of Education: N/A   Occupational History  . Social Services    Social History Main Topics  . Smoking status: Never Smoker   . Smokeless tobacco: Never Used  . Alcohol Use: No  . Drug Use: No  . Sexually Active: Not on file   Other Topics Concern  . Not on file   Social History Narrative  . No narrative on file     Allergies  Allergen Reactions  . Codeine   . Penicillins      Outpatient Prescriptions Prior to Visit  Medication Sig Dispense Refill  . busPIRone (BUSPAR) 5 MG tablet Take 5 mg by mouth at bedtime as needed.      . Chlorpheniramine Tannate 12 MG TABS Take on at bedtime  30 each  4  . cyclobenzaprine (FLEXERIL) 10 MG tablet One tablet twice daily , as needed for severe headache  40 tablet  1  . dextromethorphan (DELSYM) 30 MG/5ML liquid Take 10 mLs (60 mg total) by mouth as needed for cough.  150 mL  0  . diphenoxylate-atropine (LOMOTIL) 2.5-0.025 MG per tablet Take 1 tablet by mouth 4 (four) times daily as needed for diarrhea or loose stools.  30 tablet  0  . eletriptan (RELPAX) 40 MG tablet One  tablet by mouth at onset of headache. May repeat in 2 hours if headache persists or recurs. may repeat in 2 hours if necessary  10 tablet  1  . fluticasone (FLONASE) 50 MCG/ACT nasal spray Place 2 sprays into the nose daily.  16 g  2  . ibuprofen (ADVIL,MOTRIN) 800 MG tablet Take 1 tablet (800 mg total) by mouth every 8 (eight) hours as needed for pain.  36 tablet  4  . omeprazole (PRILOSEC) 20 MG capsule Take 1 capsule (20 mg total) by mouth daily.  30 capsule  2  . promethazine (PHENERGAN) 25 MG tablet Take 1 tablet (25 mg total) by mouth every 8 (eight) hours as needed for nausea.  30 tablet  0  . traMADol (ULTRAM) 50 MG tablet as needed. For HA      . [DISCONTINUED] topiramate (TOPAMAX) 100 MG tablet Take 1 tablet (100 mg total) by mouth daily.  30 tablet  4  . [DISCONTINUED] benzonatate (TESSALON) 100 MG capsule Take 1 capsule (100 mg total) by mouth 3 (three) times daily as needed for cough.  60 capsule  4  . [DISCONTINUED] predniSONE (DELTASONE) 10 MG tablet Take 4 for two days three for two  days two for two days one for two days  20 tablet  0  . [DISCONTINUED] topiramate (TOPAMAX) 50 MG tablet Take 50 mg by mouth daily.      Last reviewed on 06/29/2012  4:13 PM by Storm Frisk, MD    Review of Systems  Constitutional: Positive for unexpected weight change. Negative for diaphoresis, activity change, appetite change and fatigue.  HENT: Positive for neck pain and neck stiffness. Negative for hearing loss, nosebleeds, congestion, facial swelling, sneezing, mouth sores, trouble swallowing, dental problem, voice change, sinus pressure, tinnitus and ear discharge.   Eyes: Negative for photophobia, discharge, itching and visual disturbance.  Respiratory: Negative for apnea, choking, chest tightness and stridor.   Cardiovascular: Negative for palpitations and leg swelling.  Gastrointestinal: Negative for nausea, vomiting, abdominal pain, constipation, blood in stool and abdominal distention.    Genitourinary: Positive for frequency. Negative for dysuria, urgency, hematuria, flank pain, decreased urine volume and difficulty urinating.  Musculoskeletal: Positive for back pain. Negative for joint swelling, arthralgias and gait problem.  Skin: Negative for color change and pallor.  Neurological: Negative for dizziness, tremors, seizures, syncope, speech difficulty, weakness, light-headedness and numbness.  Hematological: Negative for adenopathy. Does not bruise/bleed easily.  Psychiatric/Behavioral: Negative for confusion, sleep disturbance and agitation. The patient is not nervous/anxious.        Objective:   Physical Exam  Filed Vitals:   06/29/12 1558  BP: 122/82  Pulse: 68  Temp: 98.5 F (36.9 C)  TempSrc: Oral  Height: 5\' 8"  (1.727 m)  Weight: 325 lb (147.419 kg)  SpO2: 100%    Gen: Pleasant, well-nourished, in no distress,  normal affect  ENT: No lesions,  mouth clear,  oropharynx clear, no postnasal drip  Neck: No JVD, no TMG, no carotid bruits  Lungs: No use of accessory muscles, no dullness to percussion, clear without rales or rhonchi  Cardiovascular: RRR, heart sounds normal, no murmur or gallops, no peripheral edema  Abdomen: soft and NT, no HSM,  BS normal  Musculoskeletal: No deformities, no cyanosis or clubbing  Neuro: alert, non focal  Skin: Warm, no lesions or rashes  CXR reviewed: normal Spirometry 06/30/2012: normal         Assessment & Plan:   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 busPIRone (BUSPAR) 5 MG  tablet Take 5 mg by mouth at bedtime as needed.      . Chlorpheniramine Tannate 12 MG TABS Take on at bedtime  30 each  4  . cyclobenzaprine (FLEXERIL) 10 MG tablet One tablet twice daily , as needed for severe headache  40 tablet  1  . dextromethorphan (DELSYM) 30 MG/5ML liquid Take 10 mLs (60 mg total) by mouth as needed for cough.  150 mL  0  . diphenoxylate-atropine (LOMOTIL) 2.5-0.025 MG per tablet Take 1 tablet by mouth 4 (four) times daily as needed for diarrhea or loose stools.  30 tablet  0  . eletriptan (RELPAX) 40 MG tablet One tablet by mouth at onset of headache. May repeat in 2 hours if headache persists or recurs. may repeat in 2 hours if necessary  10 tablet  1  . fluticasone (FLONASE) 50 MCG/ACT nasal spray Place 2 sprays into the nose daily.  16 g  2  . ibuprofen (ADVIL,MOTRIN) 800 MG tablet Take 1 tablet (800 mg total) by mouth every 8 (eight) hours as needed for pain.  36 tablet  4  . omeprazole (PRILOSEC) 20 MG capsule Take 1 capsule (20 mg total) by mouth daily.  30 capsule  2  . promethazine (PHENERGAN) 25 MG tablet Take 1 tablet (25 mg total) by mouth every 8 (eight) hours as needed for nausea.  30 tablet  0  . topiramate (TOPAMAX) 100 MG tablet Take 200 mg by mouth daily.      . traMADol (ULTRAM) 50 MG tablet as needed. For HA      . [DISCONTINUED] topiramate (TOPAMAX) 100 MG tablet Take 1 tablet (100 mg total) by mouth daily.  30 tablet  4  . [DISCONTINUED] benzonatate (TESSALON) 100 MG capsule Take 1 capsule (100 mg total) by mouth 3 (three) times daily as needed for cough.  60 capsule  4  . [DISCONTINUED] predniSONE (DELTASONE) 10 MG tablet Take 4 for two days three for two days two for two days one for two days  20 tablet  0  . [DISCONTINUED] topiramate (TOPAMAX) 50 MG tablet Take 50 mg by mouth daily.

## 2012-06-30 NOTE — Assessment & Plan Note (Addendum)
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

## 2012-07-27 ENCOUNTER — Encounter: Payer: Self-pay | Admitting: Family Medicine

## 2012-07-27 ENCOUNTER — Ambulatory Visit (INDEPENDENT_AMBULATORY_CARE_PROVIDER_SITE_OTHER): Payer: BC Managed Care – PPO | Admitting: Family Medicine

## 2012-07-27 VITALS — BP 140/86 | HR 62 | Resp 18 | Ht 67.0 in

## 2012-07-27 DIAGNOSIS — F329 Major depressive disorder, single episode, unspecified: Secondary | ICD-10-CM

## 2012-07-27 DIAGNOSIS — G43909 Migraine, unspecified, not intractable, without status migrainosus: Secondary | ICD-10-CM

## 2012-07-27 DIAGNOSIS — E669 Obesity, unspecified: Secondary | ICD-10-CM

## 2012-07-27 DIAGNOSIS — R110 Nausea: Secondary | ICD-10-CM

## 2012-07-27 DIAGNOSIS — F32A Depression, unspecified: Secondary | ICD-10-CM

## 2012-07-27 MED ORDER — PROMETHAZINE HCL 25 MG PO TABS: 25.0000 mg | ORAL_TABLET | Freq: Three times a day (TID) | ORAL | Status: DC | PRN

## 2012-07-27 MED ORDER — FLUOXETINE HCL 10 MG PO CAPS: 10.0000 mg | ORAL_CAPSULE | Freq: Every day | ORAL | Status: DC

## 2012-07-27 MED ORDER — PREDNISONE 5 MG PO TABS: 5.0000 mg | ORAL_TABLET | Freq: Two times a day (BID) | ORAL | Status: AC

## 2012-07-27 MED ORDER — KETOROLAC TROMETHAMINE 60 MG/2ML IJ SOLN
60.0000 mg | Freq: Once | INTRAMUSCULAR | Status: AC
  Administered 2012-07-27: 60 mg via INTRAMUSCULAR

## 2012-07-27 MED ORDER — ONDANSETRON HCL 4 MG/2ML IJ SOLN
4.0000 mg | Freq: Once | INTRAMUSCULAR | Status: AC
  Administered 2012-07-27: 4 mg via INTRAMUSCULAR

## 2012-07-27 MED ORDER — METHYLPREDNISOLONE ACETATE 80 MG/ML IJ SUSP
80.0000 mg | Freq: Once | INTRAMUSCULAR | Status: AC
  Administered 2012-07-27: 80 mg via INTRAMUSCULAR

## 2012-07-27 NOTE — Patient Instructions (Addendum)
F/u early May  Please call for therapy to help with depression, if unable to see therapist through your job soon, in the next 2 weeks , call for referral to therapist please  You will also be started on an antidepressant fluoxetine. Do NOT take relpax  Anymore since on fluoxetine  Toradol 60 mg and depo medrol 80 mg IM in the office today for headache and Zofran 4mg  Im for nausea and vomiting  Prednisone and phenergan are sent to your pharmacy  Work excuse from 2/19 to return 08/01/2012

## 2012-07-27 NOTE — Progress Notes (Signed)
  Subjective:    Patient ID: Shannon Roth, female    DOB: Apr 30, 1966, 47 y.o.   MRN: [id number]  HPI 3 day h/o 10 plus headache with nausea and vomiting, vomitted 8 times yesterday once this morning. No fever chills , sinus pressure.No weakness, numbness or blurred vision. Seeing Dr Gerilyn Pilgrim re headaches  A lot of stress, lost her home 3 month ago, poor sleep , does not want to interact with most people,  Not suicidal or homicidal. Reports no support from anyone   Review of Systems See HPI Denies recent fever or chills. Denies sinus pressure, nasal congestion, ear pain or sore throat. Denies chest congestion, productive cough or wheezing. Denies chest pains, palpitations and leg swelling Denies diarrhea or constipation.   Denies dysuria, frequency, hesitancy or incontinence. Denies joint pain, swelling and limitation in mobility. Denies skin break down or rash.        Objective:   Physical Exam  Patient alert and oriented and in no cardiopulmonary distress.Pt in pain  HEENT: No facial asymmetry, EOMI, no sinus tenderness,  oropharynx pink and moist.  Neck adequate ROM no adenopathy.  Chest: Clear to auscultation bilaterally.  CVS: S1, S2 no murmurs, no S3.  ABD: Soft non tender. Bowel sounds normal.  Ext: No edema  MS: Adequate ROM spine, shoulders, hips and knees.  Skin: Intact, no ulcerations or rash noted.  Psych: Good eye contact,flat affect. Memory intact  depressed appearing.  CNS: CN 2-12 intact, power, tone and sensation normal throughout.       Assessment & Plan:

## 2012-07-31 NOTE — Assessment & Plan Note (Signed)
Uncontrolled, currently being treated by neurologist, but here for acute episode. Sever depression and stress are the underlying issues which are negatively affecting her control Toradol and depo medrol in office followed by prednisone dose pack Work excuse  For 2 work days

## 2012-07-31 NOTE — Assessment & Plan Note (Signed)
zofran in office and phenergan sent in

## 2012-07-31 NOTE — Assessment & Plan Note (Signed)
Severe depreswsion, new diagnosis. Needs therapy as well as anti depressant med, agrees to both. Pt is neither suicidal nor homicidal, her support system is non existent

## 2012-07-31 NOTE — Assessment & Plan Note (Signed)
Unchanged, not addressed further at this visit due to acute pain and debility

## 2012-10-19 ENCOUNTER — Telehealth: Payer: Self-pay | Admitting: Family Medicine

## 2012-10-19 NOTE — Telephone Encounter (Signed)
Reviewed med list with patient.  No bp meds found.

## 2013-01-04 ENCOUNTER — Encounter: Payer: Self-pay | Admitting: Family Medicine

## 2013-01-04 ENCOUNTER — Ambulatory Visit (INDEPENDENT_AMBULATORY_CARE_PROVIDER_SITE_OTHER): Payer: BC Managed Care – PPO | Admitting: Family Medicine

## 2013-01-04 VITALS — BP 132/88 | HR 68 | Resp 18 | Ht 67.0 in | Wt 322.1 lb

## 2013-01-04 DIAGNOSIS — H6691 Otitis media, unspecified, right ear: Secondary | ICD-10-CM | POA: Insufficient documentation

## 2013-01-04 DIAGNOSIS — Z139 Encounter for screening, unspecified: Secondary | ICD-10-CM

## 2013-01-04 DIAGNOSIS — F329 Major depressive disorder, single episode, unspecified: Secondary | ICD-10-CM

## 2013-01-04 DIAGNOSIS — E669 Obesity, unspecified: Secondary | ICD-10-CM

## 2013-01-04 DIAGNOSIS — H6690 Otitis media, unspecified, unspecified ear: Secondary | ICD-10-CM

## 2013-01-04 DIAGNOSIS — F419 Anxiety disorder, unspecified: Secondary | ICD-10-CM

## 2013-01-04 DIAGNOSIS — F32A Depression, unspecified: Secondary | ICD-10-CM

## 2013-01-04 DIAGNOSIS — F411 Generalized anxiety disorder: Secondary | ICD-10-CM

## 2013-01-04 DIAGNOSIS — G43909 Migraine, unspecified, not intractable, without status migrainosus: Secondary | ICD-10-CM

## 2013-01-04 MED ORDER — SULFAMETHOXAZOLE-TRIMETHOPRIM 800-160 MG PO TABS: 1.0000 | ORAL_TABLET | Freq: Two times a day (BID) | ORAL | Status: DC

## 2013-01-04 MED ORDER — FLUCONAZOLE 150 MG PO TABS: ORAL_TABLET | ORAL | Status: AC

## 2013-01-04 NOTE — Patient Instructions (Addendum)
F/U with rectal in 4 month, call if you need me before  Youb are treated for right ear infection  Septra (antibiotic ) is prescribed for 10 days, take it all. You will get script for fluconazole for yeast infection, fill only if you need it  Keep exercise routine, cut back on quantity of food eaten , cut out sugar and work on weight loss.   CBC, fasting lipid, chem 7 , TSH and hBa1C for visit in 4 month, pls BRING results from the health dept with you at that time, DRAW in 4 mONTHS  Check on TdAP cost in the office here, you NEED one  Continue to see neurologist about migraines

## 2013-01-04 NOTE — Progress Notes (Signed)
  Subjective:    Patient ID: Shannon Roth, female    DOB: Jan 20, 1966, 47 y.o.   MRN: [id number]  HPI  2 week h/o right ear pain, states she lost her hearing , saw PA on the job, put drops in her ear for swimmers ear, states her ear  Was all swollen and she could not hear at all 3 days ago. Denies fever, chills, sinus pressure or sore throat. Denies chest congestion or cough Improved symptoms of depression and anxiety on medication as well as in therapy through her job Headaches are also less frequent Trying to commit to more physical activity, intake of food needs improvement  Review of Systems See HPI Denies recent fever or chills. Denies sinus pressure, nasal congestion,  or sore throat. Denies chest congestion, productive cough or wheezing. Denies chest pains, palpitations and leg swelling Denies abdominal pain, nausea, vomiting,diarrhea or constipation.   Denies dysuria, frequency, hesitancy or incontinence. Denies joint pain, swelling and limitation in mobility. Denies uncontrolled  Headaches,denies  seizures, numbness, or tingling. Denies uncontrolled  depression, anxiety or insomnia. Denies skin break down or rash.        Objective:   Physical Exam  Patient alert and oriented and in no cardiopulmonary distress.  HEENT: No facial asymmetry, EOMI, no sinus tenderness,  oropharynx pink and moist.  Neck supple no adenopathy.Right TM erythematous, and right outer ear canal swollen  Chest: Clear to auscultation bilaterally.  CVS: S1, S2 no murmurs, no S3.  ABD: Soft non tender. Bowel sounds normal.  Ext: No edema  MS: Adequate ROM spine, shoulders, hips and knees.  Skin: Intact, no ulcerations or rash noted.  Psych: Good eye contact, normal affect. Memory intact not anxious or depressed appearing.  CNS: CN 2-12 intact, power, tone and sensation normal throughout.       Assessment & Plan:

## 2013-01-07 NOTE — Assessment & Plan Note (Signed)
Improved with medication and therapy

## 2013-01-07 NOTE — Assessment & Plan Note (Signed)
Antibiotic course prescribed. Pt educated re the need to refrain from putting anything in her ear

## 2013-01-07 NOTE — Assessment & Plan Note (Signed)
Improved with medication and therapy. Will re score at next visit

## 2013-01-07 NOTE — Assessment & Plan Note (Signed)
Improved control, followed by neurology

## 2013-01-07 NOTE — Assessment & Plan Note (Signed)
Unchanged. Patient re-educated about  the importance of commitment to a  minimum of 150 minutes of exercise per week. The importance of healthy food choices with portion control discussed. Encouraged to start a food diary, count calories and to consider  joining a support group. Sample diet sheets offered. Goals set by the patient for the next several months.    

## 2013-01-19 ENCOUNTER — Other Ambulatory Visit: Payer: Self-pay | Admitting: Family Medicine

## 2013-01-19 DIAGNOSIS — Z139 Encounter for screening, unspecified: Secondary | ICD-10-CM

## 2013-01-20 ENCOUNTER — Telehealth: Payer: Self-pay | Admitting: Family Medicine

## 2013-01-20 NOTE — Telephone Encounter (Signed)
Patient is aware 

## 2013-01-26 ENCOUNTER — Ambulatory Visit (HOSPITAL_COMMUNITY)
Admission: RE | Admit: 2013-01-26 | Discharge: 2013-01-26 | Disposition: A | Discharge status: Home / Self Care | Payer: BC Managed Care – PPO | Source: Ambulatory Visit | Attending: Family Medicine | Admitting: Family Medicine

## 2013-01-26 DIAGNOSIS — Z139 Encounter for screening, unspecified: Secondary | ICD-10-CM

## 2013-01-26 DIAGNOSIS — Z1231 Encounter for screening mammogram for malignant neoplasm of breast: Secondary | ICD-10-CM | POA: Insufficient documentation

## 2013-04-13 ENCOUNTER — Other Ambulatory Visit: Payer: Self-pay

## 2013-05-09 ENCOUNTER — Ambulatory Visit: Payer: BC Managed Care – PPO | Admitting: Family Medicine

## 2013-05-12 ENCOUNTER — Telehealth: Payer: Self-pay

## 2013-05-12 MED ORDER — ONDANSETRON HCL 4 MG PO TABS: 4.0000 mg | ORAL_TABLET | Freq: Three times a day (TID) | ORAL | Status: AC | PRN

## 2013-05-12 MED ORDER — DIPHENOXYLATE-ATROPINE 2.5-0.025 MG PO TABS: 1.0000 | ORAL_TABLET | Freq: Four times a day (QID) | ORAL | Status: AC | PRN

## 2013-05-12 NOTE — Telephone Encounter (Signed)
Patient aware.

## 2013-05-12 NOTE — Telephone Encounter (Signed)
pls fax lomotil and let her know zofran and lomotil sent in. Review BRAT diet with her. If symptoms worsen or are prolonged and she feels weak, light headed with poor urination, needs to go to ED for eval and IV fluids pls explain also

## 2013-05-28 ENCOUNTER — Other Ambulatory Visit: Payer: Self-pay | Admitting: Critical Care Medicine

## 2013-07-12 ENCOUNTER — Ambulatory Visit (INDEPENDENT_AMBULATORY_CARE_PROVIDER_SITE_OTHER): Payer: BC Managed Care – PPO | Admitting: Family Medicine

## 2013-07-12 ENCOUNTER — Encounter: Payer: Self-pay | Admitting: Family Medicine

## 2013-07-12 VITALS — BP 116/80 | HR 78 | Resp 18 | Ht 67.0 in | Wt 334.0 lb

## 2013-07-12 DIAGNOSIS — E669 Obesity, unspecified: Secondary | ICD-10-CM

## 2013-07-12 DIAGNOSIS — F329 Major depressive disorder, single episode, unspecified: Secondary | ICD-10-CM

## 2013-07-12 DIAGNOSIS — F32A Depression, unspecified: Secondary | ICD-10-CM

## 2013-07-12 DIAGNOSIS — F3289 Other specified depressive episodes: Secondary | ICD-10-CM

## 2013-07-12 DIAGNOSIS — E8881 Metabolic syndrome: Secondary | ICD-10-CM

## 2013-07-12 DIAGNOSIS — Z23 Encounter for immunization: Secondary | ICD-10-CM

## 2013-07-12 DIAGNOSIS — G43909 Migraine, unspecified, not intractable, without status migrainosus: Secondary | ICD-10-CM

## 2013-07-12 DIAGNOSIS — F411 Generalized anxiety disorder: Secondary | ICD-10-CM

## 2013-07-12 DIAGNOSIS — F419 Anxiety disorder, unspecified: Secondary | ICD-10-CM

## 2013-07-12 MED ORDER — FLUOXETINE HCL (PMDD) 10 MG PO TABS: ORAL_TABLET | ORAL | Status: DC

## 2013-07-12 MED ORDER — CYCLOBENZAPRINE HCL 10 MG PO TABS: ORAL_TABLET | ORAL | Status: DC

## 2013-07-12 MED ORDER — TRAMADOL HCL 50 MG PO TABS: ORAL_TABLET | ORAL | Status: DC

## 2013-07-12 NOTE — Patient Instructions (Signed)
F/u in 4 month, call if you need me before  Medication sent for headache is flexeril, tramadol and continue topamax  Work excuse for today to return tomorrow  Please drop off labs/fax over  Flu vaccine today  Please take prozac every day

## 2013-07-13 NOTE — Assessment & Plan Note (Signed)
Takes prozac intermittently , denies suicidal or homicidal ideation, but constantly feels stressed , advised to take daily as prescribed and seek therapy through her job

## 2013-07-13 NOTE — Assessment & Plan Note (Signed)
Deteriorated. Patient re-educated about  the importance of commitment to a  minimum of 150 minutes of exercise per week. The importance of healthy food choices with portion control discussed. Encouraged to start a food diary, count calories and to consider  joining a support group. Sample diet sheets offered. Goals set by the patient for the next several months.    

## 2013-07-13 NOTE — Assessment & Plan Note (Signed)
Increased and uncontrolled, pt needs to take medication regularly

## 2013-07-13 NOTE — Progress Notes (Signed)
   Subjective:    Patient ID: Shannon Roth, female    DOB: Mar 14, 1966, 48 y.o.   MRN: [id number]  HPI The PT is here due to uncontrolled severe headache which started 1 day ago while working on her taxes. She states she was working on her taxes and this made her very stressed , she had no flexeril available and states that this helps her to relieve her symptoms.States she does not take topamax as prescribed, unable to tolerate 100 mg and reports weekly headaches keeping her from work. Has not seen neurologist recently due to bill States she feels excessively stressed most of the time , between work and family, and economic stress, considering applying for disability    Review of Systems See HPI Denies recent fever or chills. Denies sinus pressure, nasal congestion, ear pain or sore throat. Denies chest congestion, productive cough or wheezing. Denies chest pains, palpitations and leg swelling Denies abdominal pain, nausea, vomiting,diarrhea or constipation.   Denies dysuria, frequency, hesitancy or incontinence. Denies joint pain, swelling and limitation in mobility. Denies  seizures, numbness, or tingling. C/o depression and anxiety Denies skin break down or rash.         Objective:   Physical Exam Patient alert and oriented and in no cardiopulmonary distress.  HEENT: No facial asymmetry, EOMI, no sinus tenderness,  oropharynx pink and moist.  Neck supple no adenopathy.  Chest: Clear to auscultation bilaterally.  CVS: S1, S2 no murmurs, no S3.  ABD: Soft non tender. Bowel sounds normal.  Ext: No edema  MS: Adequate ROM spine, shoulders, hips and knees.  Skin: Intact, no ulcerations or rash noted.  Psych: Good eye contact, normal affect. Memory intact anxious and mildly depressed appearing.  CNS: CN 2-12 intact, power, tone and sensation normal throughout.        Assessment & Plan:

## 2013-07-13 NOTE — Assessment & Plan Note (Signed)
The increased risk of cardiovascular disease associated with this diagnosis, and the need to consistently work on lifestyle to change this is discussed. Following  a  heart healthy diet ,commitment to 30 minutes of exercise at least 5 days per week, as well as control of blood sugar and cholesterol , and achieving a healthy weight are all the areas to be addressed .  

## 2013-07-13 NOTE — Assessment & Plan Note (Signed)
Uncontrolled, on no regular prophylaxis, considering re establishing with neurology and she needs to Medication prescribed and work excuse for today. Pt stated does not need one as she has FMLA, however same was provided

## 2013-07-24 NOTE — Telephone Encounter (Signed)
Patient is aware 

## 2013-07-28 ENCOUNTER — Other Ambulatory Visit: Payer: Self-pay | Admitting: Family Medicine

## 2013-08-19 ENCOUNTER — Telehealth: Payer: Self-pay | Admitting: Family Medicine

## 2013-08-19 MED ORDER — PREDNISONE 5 MG PO TABS: 5.0000 mg | ORAL_TABLET | Freq: Two times a day (BID) | ORAL | Status: AC

## 2013-08-19 NOTE — Telephone Encounter (Signed)
3 day h/o headache not responding to tramadol and ibuprofen.  Denies any other neurologic symptoms, will send in prednisone x 5 days, she states she may need to go to the Ed for relief, I suggested urgent care or ED Denies any increased stress in her life

## 2013-11-23 DIAGNOSIS — Z0279 Encounter for issue of other medical certificate: Secondary | ICD-10-CM

## 2014-03-19 ENCOUNTER — Other Ambulatory Visit: Payer: Self-pay | Admitting: Family Medicine

## 2014-03-19 DIAGNOSIS — Z1239 Encounter for other screening for malignant neoplasm of breast: Secondary | ICD-10-CM

## 2014-03-22 ENCOUNTER — Ambulatory Visit
Admission: RE | Admit: 2014-03-22 | Discharge: 2014-03-22 | Disposition: A | Discharge status: Home / Self Care | Payer: BC Managed Care – PPO | Source: Ambulatory Visit | Attending: Family Medicine | Admitting: Family Medicine

## 2014-03-22 DIAGNOSIS — Z1239 Encounter for other screening for malignant neoplasm of breast: Secondary | ICD-10-CM

## 2014-11-22 ENCOUNTER — Telehealth: Payer: Self-pay | Admitting: *Deleted

## 2014-11-22 NOTE — Telephone Encounter (Signed)
Pt called stating she is having sharpe pains going through her head pt states it is like lighting going through her head, the pains have started 30 minutes ago today. Please advise

## 2014-11-22 NOTE — Telephone Encounter (Signed)
Patient aware to go to ER if pain doesn't stop or gets worse and to sch ed follow up here

## 2015-01-04 ENCOUNTER — Other Ambulatory Visit: Payer: Self-pay

## 2015-01-04 DIAGNOSIS — Z1231 Encounter for screening mammogram for malignant neoplasm of breast: Secondary | ICD-10-CM

## 2015-01-31 ENCOUNTER — Ambulatory Visit (INDEPENDENT_AMBULATORY_CARE_PROVIDER_SITE_OTHER): Payer: BLUE CROSS/BLUE SHIELD | Admitting: Family Medicine

## 2015-01-31 ENCOUNTER — Encounter: Payer: Self-pay | Admitting: Family Medicine

## 2015-01-31 ENCOUNTER — Other Ambulatory Visit (HOSPITAL_COMMUNITY)
Admission: RE | Admit: 2015-01-31 | Discharge: 2015-01-31 | Disposition: A | Discharge status: Home / Self Care | Payer: BLUE CROSS/BLUE SHIELD | Source: Ambulatory Visit | Attending: Family Medicine | Admitting: Family Medicine

## 2015-01-31 VITALS — BP 136/72 | HR 72 | Resp 18 | Ht 69.0 in | Wt 327.0 lb

## 2015-01-31 DIAGNOSIS — Z124 Encounter for screening for malignant neoplasm of cervix: Secondary | ICD-10-CM | POA: Diagnosis not present

## 2015-01-31 DIAGNOSIS — Z01419 Encounter for gynecological examination (general) (routine) without abnormal findings: Secondary | ICD-10-CM | POA: Insufficient documentation

## 2015-01-31 DIAGNOSIS — Z0000 Encounter for general adult medical examination without abnormal findings: Secondary | ICD-10-CM

## 2015-01-31 DIAGNOSIS — Z1211 Encounter for screening for malignant neoplasm of colon: Secondary | ICD-10-CM

## 2015-01-31 DIAGNOSIS — Z1151 Encounter for screening for human papillomavirus (HPV): Secondary | ICD-10-CM | POA: Insufficient documentation

## 2015-01-31 LAB — POC HEMOCCULT BLD/STL (OFFICE/1-CARD/DIAGNOSTIC): Fecal Occult Blood, POC: NEGATIVE

## 2015-01-31 NOTE — Patient Instructions (Signed)
F/u in 5 month, call if you need me bvefore  Please work on good  health habits so that your health will improve. 1. Commitment to daily physical activity for 30 to 60  minutes, if you are able to do this.  2. Commitment to wise food choices. Aim for half of your  food intake to be vegetable and fruit, one quarter starchy foods, and one quarter protein. Try to eat on a regular schedule  3 meals per day, snacking between meals should be limited to vegetables or fruits or small portions of nuts. 64 ounces of water per day is generally recommended, unless you have specific health conditions, like heart failure or kidney failure where you will need to limit fluid intake.  3. Commitment to sufficient and a  good quality of physical and mental rest daily, generally between 6 to 8 hours per day.  WITH PERSISTANCE AND PERSEVERANCE, THE IMPOSSIBLE , BECOMES THE NORM!  Flu vaccine available from Monday to Thursday starting in September.  Please come and get your vaccine to protect yourself  and your family.  TdaP is past due please come for this  Fasting labs  asap  You are referred to dr Oneida Alar for colonoscopy  Thanks for choosing [facility], we consider it a privelige to serve you.

## 2015-02-03 DIAGNOSIS — Z0000 Encounter for general adult medical examination without abnormal findings: Secondary | ICD-10-CM | POA: Insufficient documentation

## 2015-02-03 NOTE — Assessment & Plan Note (Signed)

## 2015-02-03 NOTE — Progress Notes (Signed)
   Subjective:    Patient ID: Shannon Roth, female    DOB: Aug 11, 1965, 49 y.o.   MRN: [id number]  HPI Patient is in for annual physical exam. No other health concerns are expressed or addressed at the visit. States headaches are much better, since she is refusing to become stressed about anything. Needs labs Immunization is reviewed , and  updated if needed.    Review of Systems See HPI Denies recent fever or chills. Denies sinus pressure, nasal congestion, ear pain or sore throat. Denies chest congestion, productive cough or wheezing. Denies chest pains, palpitations and leg swelling Denies abdominal pain, nausea, vomiting,diarrhea or constipation.   Denies dysuria, frequency, hesitancy or incontinence. Denies joint pain, swelling and limitation in mobility. Denies  seizures, numbness, or tingling. Denies depression, anxiety or insomnia.Does report being impatient Denies skin break down or rash.        Objective:   Physical Exam BP 136/72 mmHg  Pulse 72  Resp 18  Ht 5\' 9"  (1.753 m)  Wt 327 lb (148.326 kg)  BMI 48.27 kg/m2  SpO2 96%   Pleasant  Morbidly obeses female, alert and oriented x 3, in no cardio-pulmonary distress. Afebrile. HEENT No facial trauma or asymetry. Sinuses non tender.  Extra occullar muscles intact, . External ears normal, tympanic membranes clear. Oropharynx moist, no exudate,  Fairly good dentition. Neck: supple, no adenopathy,JVD or thyromegaly.No bruits.  Chest: Clear to ascultation bilaterally.No crackles or wheezes. Non tender to palpation  Breast: No asymetry,no masses or lumps. No tenderness. No nipple discharge or inversion. No axillary or supraclavicular adenopathy  Cardiovascular system; Heart sounds normal,  S1 and  S2 ,no S3.  No murmur, or thrill. Apical beat not displaced Peripheral pulses normal.  Abdomen: Soft, non tender, no organomegaly or masses. No bruits. Bowel sounds normal. No guarding, tenderness or  rebound.  Rectal:  Normal sphincter tone. No mass.No rectal masses.  Guaiac negative stool.  GU: External genitalia normal female genitalia , female distribution of hair. No lesions. Urethral meatus normal in size, no  Prolapse, no lesions visibly  Present. Bladder non tender. Vagina pink and moist , with no visible lesions , discharge present . Adequate pelvic support no  cystocele or rectocele noted  Uterus absent , no adnexal masses, no  adnexal tenderness.   Musculoskeletal exam: Full ROM of spine, hips , shoulders and knees. No deformity ,swelling or crepitus noted. No muscle wasting or atrophy.   Neurologic: Cranial nerves 2 to 12 intact. Power, tone ,sensation and reflexes normal throughout. No disturbance in gait. No tremor.  Skin: Intact, no ulceration, erythema , scaling or rash noted. Pigmentation normal throughout  Psych; Normal mood and affect. Judgement and concentration normal        Assessment & Plan:  Annual physical exam Annual exam as documented. Counseling done  re healthy lifestyle involving commitment to 150 minutes exercise per week, heart healthy diet, and attaining healthy weight.The importance of adequate sleep also discussed. Regular seat belt use and home safety, is also discussed. Changes in health habits are decided on by the patient with goals and time frames  set for achieving them. Immunization and cancer screening needs are specifically addressed at this visit.

## 2015-02-05 LAB — CYTOLOGY - PAP

## 2015-02-20 ENCOUNTER — Emergency Department (HOSPITAL_COMMUNITY)
Admission: EM | Admit: 2015-02-20 | Discharge: 2015-02-20 | Disposition: A | Discharge status: Home / Self Care | Payer: BLUE CROSS/BLUE SHIELD | Attending: Emergency Medicine | Admitting: Emergency Medicine

## 2015-02-20 ENCOUNTER — Emergency Department (HOSPITAL_COMMUNITY): Payer: BLUE CROSS/BLUE SHIELD

## 2015-02-20 ENCOUNTER — Encounter (HOSPITAL_COMMUNITY): Payer: Self-pay | Admitting: Emergency Medicine

## 2015-02-20 DIAGNOSIS — R0602 Shortness of breath: Secondary | ICD-10-CM | POA: Diagnosis not present

## 2015-02-20 DIAGNOSIS — Z880 Allergy status to penicillin: Secondary | ICD-10-CM | POA: Diagnosis not present

## 2015-02-20 DIAGNOSIS — Z8639 Personal history of other endocrine, nutritional and metabolic disease: Secondary | ICD-10-CM | POA: Insufficient documentation

## 2015-02-20 DIAGNOSIS — Z8679 Personal history of other diseases of the circulatory system: Secondary | ICD-10-CM | POA: Insufficient documentation

## 2015-02-20 DIAGNOSIS — J029 Acute pharyngitis, unspecified: Secondary | ICD-10-CM | POA: Insufficient documentation

## 2015-02-20 DIAGNOSIS — Z79899 Other long term (current) drug therapy: Secondary | ICD-10-CM | POA: Diagnosis not present

## 2015-02-20 DIAGNOSIS — Z8719 Personal history of other diseases of the digestive system: Secondary | ICD-10-CM | POA: Diagnosis not present

## 2015-02-20 DIAGNOSIS — E669 Obesity, unspecified: Secondary | ICD-10-CM | POA: Diagnosis not present

## 2015-02-20 LAB — RAPID STREP SCREEN (MED CTR MEBANE ONLY): Streptococcus, Group A Screen (Direct): NEGATIVE

## 2015-02-20 NOTE — ED Provider Notes (Signed)
CSN: [id number]     Arrival date & time 02/20/15  1042 History  This chart was scribed for Shannon Fraise, MD by Terressa Koyanagi, ED Scribe. This patient was seen in room APA18/APA18 and the patient's care was started at 12:12 PM.   Chief Complaint  Patient presents with  . Sore Throat   The history is provided by the patient. No language interpreter was used.   PCP: Tula Nakayama, MD HPI Comments: Shannon Roth is a 49 y.o. female, with PMHx noted below, who presents to the Emergency Department complaining of sore throat with associated sensation of "throat closing up", cough and difficulty breathing onset last night and worsening today. Pt confirms she is able to swallow and does not feel like she is choking. Pt denies fever, headache, swelling of throat, chest pain, severe back pain, rash, tongue swelling, lip swelling, abd pain, or diarrhea.  No known allergic reactions No new meds  Past Medical History  Diagnosis Date  . Migraine   . Rectal bleeding   . Obesity   . Arthritis   . Cough   . Migraine 2002   Past Surgical History  Procedure Laterality Date  . Total abdominal hysterectomy w/ bilateral salpingoophorectomy    . Cholecystectomy  2003  . Bilateral biopsy breast  2002    benign  . Bilateral tubal ligation  2001  . Eye surgery      right cataract removal    Family History  Problem Relation Age of Onset  . Emphysema Paternal Grandfather   . Emphysema Maternal Grandmother   . Asthma Mother   . Lung cancer Maternal Grandmother    Social History  Substance Use Topics  . Smoking status: Never Smoker   . Smokeless tobacco: Never Used  . Alcohol Use: No   OB History    No data available     Review of Systems  Constitutional: Negative for fever.  HENT: Positive for sore throat. Negative for facial swelling and trouble swallowing.   Respiratory: Positive for cough and shortness of breath.   Cardiovascular: Negative for chest pain.  Gastrointestinal:  Negative for abdominal pain and diarrhea.  Musculoskeletal: Negative for back pain.  Skin: Negative for rash.  Neurological: Negative for headaches.  All other systems reviewed and are negative.  Allergies  Codeine and Penicillins  Home Medications   Prior to Admission medications   Medication Sig Start Date End Date Taking? Authorizing Provider  Phenyleph-Doxylamine-DM-APAP (ALKA SELTZER PLUS PO) Take 2 tablets by mouth daily.   Yes Historical Provider, MD   Triage Vitals: BP 173/75 mmHg  Pulse 72  Temp(Src) 98.3 F (36.8 C) (Oral)  Resp 16  Ht 5\' 9"  (1.753 m)  Wt 327 lb (148.326 kg)  BMI 48.27 kg/m2  SpO2 100% Physical Exam CONSTITUTIONAL: Well developed/well nourished, no distress, she is comfortable appearing HEAD: Normocephalic/atraumatic EYES: EOMI/PERRL ENMT: Mucous membranes moist, uvula midline without edema, erythema noted with no exudate, no stridor, no angioedema NECK: supple no meningeal signs, no anterior soft tissue tenderness/edema SPINE/BACK:entire spine nontender CV: S1/S2 noted, no murmurs/rubs/gallops noted LUNGS: Lungs are clear to auscultation bilaterally, no apparent distress ABDOMEN: soft, nontender, no rebound or guarding, bowel sounds noted throughout abdomen GU:no cva tenderness NEURO: Pt is awake/alert/appropriate, moves all extremitiesx4.  No facial droop.   EXTREMITIES: pulses normal/equal, full ROM SKIN: warm, color normal, no rash PSYCH: no abnormalities of mood noted, alert and oriented to situation  ED Course  Procedures  DIAGNOSTIC STUDIES: Oxygen Saturation is 100%  on RA, nl by my interpretation.    COORDINATION OF CARE: 12:16 PM: Discussed treatment plan which includes imaging with pt at bedside; patient verbalizes understanding and agrees with treatment plan. Pt offered meds for pain but declines at this time.  Reviewed notes from outpatient visit earlier in the morning Pt was diagnosed with "airway compromise" and sent via EMS Pt  currently well appearing, no distress,  No signs of PTA, no angioedema Will obtain soft tissue neck and reassess Pt voice is mildly hoarse but does not sound "hot potato" type voice  Pt well appearing Taking PO fluids without difficulty I doubt deep space infection in neck She has no signs of allergic type reaction Discussed strict ER return precautions Stable for d/c home Labs Review Labs Reviewed  RAPID STREP SCREEN (NOT AT [facility])  CULTURE, GROUP A STREP   Imaging Review Dg Neck Soft Tissue  02/20/2015   CLINICAL DATA:  Sore throat, hoarseness for 2 days.  EXAM: NECK SOFT TISSUES - 1+ VIEW  COMPARISON:  None.  FINDINGS: There is no evidence of retropharyngeal soft tissue swelling or epiglottic enlargement. The cervical airway is unremarkable and no radio-opaque foreign body identified.  IMPRESSION: Negative.   Electronically Signed   By: Shannon Roth M.D.   On: 02/20/2015 13:18   I have personally reviewed and evaluated these images and lab results as part of my medical decision-making.   EKG Interpretation   Date/Time:  Wednesday February 20 2015 10:47:59 EDT Ventricular Rate:  71 PR Interval:  150 QRS Duration: 97 QT Interval:  429 QTC Calculation: 466 R Axis:   1 Text Interpretation:  Sinus rhythm Borderline repolarization abnormality  Baseline wander in lead(s) II V6 No previous ECGs available Confirmed by  Shannon Gentles  MD, Shannon Roth ([id number]) on 02/20/2015 12:11:20 PM      MDM   Final diagnoses:  Pharyngitis    Nursing notes including past medical history and social history reviewed and considered in documentation xrays/imaging reviewed by myself and considered during evaluation Labs/vital reviewed myself and considered during evaluation    I personally performed the services described in this documentation, which was scribed in my presence. The recorded information has been reviewed and is accurate.      Shannon Fraise, MD 02/20/15 1359

## 2015-02-20 NOTE — ED Notes (Signed)
Pt states that she has a scratchy throat with drainage.  Went to pcp this morning and was told that it looked like her throat was swollen and was sent here for eval.

## 2015-02-23 LAB — CULTURE, GROUP A STREP: Strep A Culture: NEGATIVE

## 2015-03-25 ENCOUNTER — Ambulatory Visit: Payer: Self-pay

## 2015-03-28 ENCOUNTER — Telehealth: Payer: Self-pay | Admitting: Family Medicine

## 2015-03-28 ENCOUNTER — Telehealth: Payer: Self-pay

## 2015-03-28 DIAGNOSIS — R053 Chronic cough: Secondary | ICD-10-CM

## 2015-03-28 DIAGNOSIS — R05 Cough: Secondary | ICD-10-CM

## 2015-03-28 DIAGNOSIS — R0602 Shortness of breath: Secondary | ICD-10-CM

## 2015-03-28 NOTE — Telephone Encounter (Signed)
Patient is aware of the recommendation for the inhaler and states that she already has one and that doesn't help symptoms.   Was diagnosed with pneumonia by the pa that works with her.   She is aware that records from [facility] will have to be reviewed first.

## 2015-03-28 NOTE — Telephone Encounter (Signed)
Record review reveals normal findings, normal CXR and no wheezing documented. She needfs pFT testing to establish whether  She does have need for neb machine explain this is for asthma or chronic bronchitis, neither have been diagnosed and there are multiple potential adverse s/e of all drugs so should be used only if needed OK to refer  For PFT and sched f/u with me in next 1 to 2 weeks, pls let me know of response if negative I recommend singulair 10 mg one daily to help with cough pl send in 30 day supply if she agrees, until more clarity obtained

## 2015-03-28 NOTE — Telephone Encounter (Signed)
Will need notes, no dx of asthma, may be having acute bronchitis, albuterol MDI 2 puffs every 8 hrs as needed # 1 only is appropriate until more medical info available, she will  Likely also need lung function test prior to neb machine , OK to send MDI and explain the process, sched ED f/u here with her so I can further evaluate and determine appropriate long term ts and dx pls also

## 2015-04-05 NOTE — Telephone Encounter (Signed)
Called and left message for patient to return call.  

## 2015-04-08 ENCOUNTER — Telehealth: Payer: Self-pay | Admitting: Family Medicine

## 2015-04-08 NOTE — Telephone Encounter (Signed)
Courtney Mrs. Lazaro is returning your phone call

## 2015-04-10 NOTE — Telephone Encounter (Signed)
Mrs. Quesnel is calling again today asking for [facility]

## 2015-04-15 MED ORDER — MONTELUKAST SODIUM 10 MG PO TABS: 10.0000 mg | ORAL_TABLET | Freq: Every day | ORAL | Status: DC

## 2015-04-15 NOTE — Addendum Note (Signed)
Addended by: Denman George B on: 04/15/2015 10:51 AM   Modules accepted: Orders

## 2015-04-15 NOTE — Telephone Encounter (Signed)
Spoke with patient and she is in aggreance for PFT and Singulair prescription.  Rx sent to pharmacy.  Orders placed for PFT.

## 2015-04-15 NOTE — Telephone Encounter (Signed)
See previous message

## 2015-04-19 ENCOUNTER — Ambulatory Visit (HOSPITAL_COMMUNITY)
Admission: RE | Admit: 2015-04-19 | Discharge: 2015-04-19 | Disposition: A | Discharge status: Home / Self Care | Payer: BLUE CROSS/BLUE SHIELD | Source: Ambulatory Visit | Attending: Family Medicine | Admitting: Family Medicine

## 2015-04-19 DIAGNOSIS — R0602 Shortness of breath: Secondary | ICD-10-CM

## 2015-04-19 DIAGNOSIS — R05 Cough: Secondary | ICD-10-CM | POA: Insufficient documentation

## 2015-04-19 DIAGNOSIS — R053 Chronic cough: Secondary | ICD-10-CM

## 2015-04-19 LAB — PULMONARY FUNCTION TEST
DL/VA % pred: 90 %
DL/VA: 4.75 ml/min/mmHg/L
DLCO unc % pred: 56 %
DLCO unc: 16.62 ml/min/mmHg
FEF 25-75 Post: 2.91 L/sec
FEF 25-75 Pre: 2.04 L/sec
FEF2575-%Change-Post: 42 %
FEF2575-%Pred-Post: 106 %
FEF2575-%Pred-Pre: 74 %
FEV1-%Change-Post: 8 %
FEV1-%Pred-Post: 79 %
FEV1-%Pred-Pre: 73 %
FEV1-Post: 2.14 L
FEV1-Pre: 1.98 L
FEV1FVC-%Change-Post: 4 %
FEV1FVC-%Pred-Pre: 101 %
FEV6-%Change-Post: 3 %
FEV6-%Pred-Post: 75 %
FEV6-%Pred-Pre: 73 %
FEV6-Post: 2.48 L
FEV6-Pre: 2.4 L
FEV6FVC-%Change-Post: 0 %
FEV6FVC-%Pred-Post: 103 %
FEV6FVC-%Pred-Pre: 102 %
FVC-%Change-Post: 3 %
FVC-%Pred-Post: 73 %
FVC-%Pred-Pre: 71 %
FVC-Post: 2.48 L
FVC-Pre: 2.41 L
Post FEV1/FVC ratio: 86 %
Post FEV6/FVC ratio: 100 %
Pre FEV1/FVC ratio: 82 %
Pre FEV6/FVC Ratio: 100 %
RV % pred: 90 %
RV: 1.8 L
TLC % pred: 67 %
TLC: 3.79 L

## 2015-04-19 MED ORDER — ALBUTEROL SULFATE (2.5 MG/3ML) 0.083% IN NEBU
2.5000 mg | INHALATION_SOLUTION | Freq: Once | RESPIRATORY_TRACT | Status: AC
  Administered 2015-04-19: 2.5 mg via RESPIRATORY_TRACT

## 2015-05-17 ENCOUNTER — Telehealth: Payer: Self-pay | Admitting: General Practice

## 2015-05-17 NOTE — Telephone Encounter (Signed)
Referral was sent over from Dr. Moshe Cipro to have a screening tcs.  Please call the patient at [phone] to schedule.  Routing to [employer]

## 2015-05-20 ENCOUNTER — Telehealth: Payer: Self-pay

## 2015-05-20 NOTE — Telephone Encounter (Signed)
See separate triage.  

## 2015-05-21 ENCOUNTER — Other Ambulatory Visit: Payer: Self-pay

## 2015-05-21 DIAGNOSIS — Z1211 Encounter for screening for malignant neoplasm of colon: Secondary | ICD-10-CM

## 2015-05-21 MED ORDER — SOD PICOSULFATE-MAG OX-CIT ACD 10-3.5-12 MG-GM-GM PO PACK: 1.0000 | PACK | ORAL | Status: DC

## 2015-05-21 NOTE — Telephone Encounter (Signed)
PREPOPIK-DRINK WATER TO KEEP URINE LIGHT YELLOW.  Full Liquid Diet A high-calorie, high-protein supplement should be used to meet your nutritional requirements when the full liquid diet is continued for more than 2 or 3 days. If this diet is to be used for an extended period of time (more than 7 days), a multivitamin should be considered.  Breads and Starches  Allowed: None are allowed except crackers pureed (made into a thick, smooth soup) in soup.   Avoid: Any others.    Potatoes/Pasta/Rice  Allowed: ANY ITEM AS A SOUP OR SMALL PLATE OF MASHED POTATOES OR SCRAMBLED EGGS.       Vegetables  Allowed: Strained tomato or vegetable juice. Vegetables pureed in soup.   Avoid: Any others.    Fruit  Allowed: Any strained fruit juices and fruit drinks. Include 1 serving of citrus or vitamin C-enriched fruit juice daily.   Avoid: Any others.  Meat and Meat Substitutes  Allowed: Egg  Avoid: Any meat, fish, or fowl. All cheese.  Milk  Allowed: SOY Milk beverages, including milk shakes and instant breakfast mixes. Smooth yogurt.   Avoid: Any others. Avoid dairy products if not tolerated.    Soups and Combination Foods  Allowed: Broth, strained cream soups. Strained, broth-based soups.   Avoid: Any others.    Desserts and Sweets  Allowed: flavored gelatin, tapioca, ice cream, sherbet, smooth pudding, junket, fruit ices, frozen ice pops, pudding pops, frozen fudge pops, chocolate syrup. Sugar, honey, jelly, syrup.   Avoid: Any others.  Fats and Oils  Allowed: Margarine, butter, cream, sour cream, oils.   Avoid: Any others.  Beverages  Allowed: All.   Avoid: None.  Condiments  Allowed: Iodized salt, pepper, spices, flavorings. Cocoa powder.   Avoid: Any others.    SAMPLE MEAL PLAN Breakfast   cup orange juice.   1 OR 2 EGGS  1 cup milk.   1 cup beverage (coffee or tea).   Cream or sugar, if desired.    Midmorning Snack  2 SCRAMBLED OR HARD  BOILED EGG   Lunch  1 cup cream soup.    cup fruit juice.   1 cup milk.    cup custard.   1 cup beverage (coffee or tea).   Cream or sugar, if desired.    Midafternoon Snack  1 cup milk shake.  Dinner  1 cup cream soup.    cup fruit juice.   1 cup MILK    cup pudding.   1 cup beverage (coffee or tea).   Cream or sugar, if desired.  Evening Snack  1 cup supplement.  To increase calories, add sugar, cream, butter, or margarine if possible. Nutritional supplements will also increase the total calories.

## 2015-05-21 NOTE — Telephone Encounter (Signed)
Rx sent to the pharmacy and instructions mailed to pt.  

## 2015-05-21 NOTE — Telephone Encounter (Signed)
Gastroenterology Pre-Procedure Review  Request Date: 05/20/2015 Requesting Physician: Dr. Moshe Cipro  PATIENT REVIEW QUESTIONS: The patient responded to the following health history questions as indicated:    1. Diabetes Melitis: no 2. Joint replacements in the past 12 months: no 3. Major health problems in the past 3 months: no 4. Has an artificial valve or MVP: no 5. Has a defibrillator: no 6. Has been advised in past to take antibiotics in advance of a procedure like teeth cleaning: no 7. Family history of colon cancer: no  8. Alcohol Use: no 9. History of sleep apnea: no     MEDICATIONS & ALLERGIES:    Patient reports the following regarding taking any blood thinners:   Plavix? no Aspirin? no Coumadin? no  Patient confirms/reports the following medications:  Current Outpatient Prescriptions  Medication Sig Dispense Refill  . montelukast (SINGULAIR) 10 MG tablet Take 1 tablet (10 mg total) by mouth at bedtime. (Patient not taking: Reported on 05/20/2015) 30 tablet 3  . Phenyleph-Doxylamine-DM-APAP (ALKA SELTZER PLUS PO) Take 2 tablets by mouth daily.     No current facility-administered medications for this visit.    Patient confirms/reports the following allergies:  Allergies  Allergen Reactions  . Codeine   . Penicillins     No orders of the defined types were placed in this encounter.    AUTHORIZATION INFORMATION Primary Insurance:   ID #:   Group #:  Pre-Cert / Auth required:  Pre-Cert / Auth #:   Secondary Insurance:   ID #:   Group #:  Pre-Cert / Auth required:  Pre-Cert / Auth #:   SCHEDULE INFORMATION: Procedure has been scheduled as follows:  Date:  06/21/2015                 Time:  8:30 Am Location: [facility] Short Stay  This Gastroenterology Pre-Precedure Review Form is being routed to the following provider(s): Barney Drain, MD

## 2015-05-30 ENCOUNTER — Telehealth: Payer: Self-pay

## 2015-05-30 NOTE — Telephone Encounter (Signed)
Per Fax from [place]  PA not required for the screening colonoscopy. Having it scanned in to epic.

## 2015-06-21 ENCOUNTER — Encounter (HOSPITAL_COMMUNITY): Payer: Self-pay | Admitting: *Deleted

## 2015-06-21 ENCOUNTER — Ambulatory Visit (HOSPITAL_COMMUNITY)
Admission: RE | Admit: 2015-06-21 | Discharge: 2015-06-21 | Disposition: A | Discharge status: Home / Self Care | Payer: BLUE CROSS/BLUE SHIELD | Source: Ambulatory Visit | Attending: Gastroenterology | Admitting: Gastroenterology

## 2015-06-21 ENCOUNTER — Encounter (HOSPITAL_COMMUNITY)
Admission: RE | Disposition: A | Discharge status: Home / Self Care | Payer: Self-pay | Source: Ambulatory Visit | Attending: Gastroenterology

## 2015-06-21 DIAGNOSIS — Z1211 Encounter for screening for malignant neoplasm of colon: Secondary | ICD-10-CM | POA: Diagnosis not present

## 2015-06-21 DIAGNOSIS — Z6841 Body Mass Index (BMI) 40.0 and over, adult: Secondary | ICD-10-CM | POA: Insufficient documentation

## 2015-06-21 DIAGNOSIS — Z79899 Other long term (current) drug therapy: Secondary | ICD-10-CM | POA: Insufficient documentation

## 2015-06-21 DIAGNOSIS — Z880 Allergy status to penicillin: Secondary | ICD-10-CM | POA: Diagnosis not present

## 2015-06-21 DIAGNOSIS — Z885 Allergy status to narcotic agent status: Secondary | ICD-10-CM | POA: Diagnosis not present

## 2015-06-21 DIAGNOSIS — G43909 Migraine, unspecified, not intractable, without status migrainosus: Secondary | ICD-10-CM | POA: Diagnosis not present

## 2015-06-21 DIAGNOSIS — K635 Polyp of colon: Secondary | ICD-10-CM

## 2015-06-21 DIAGNOSIS — K648 Other hemorrhoids: Secondary | ICD-10-CM | POA: Insufficient documentation

## 2015-06-21 DIAGNOSIS — Q438 Other specified congenital malformations of intestine: Secondary | ICD-10-CM | POA: Diagnosis not present

## 2015-06-21 DIAGNOSIS — E669 Obesity, unspecified: Secondary | ICD-10-CM | POA: Insufficient documentation

## 2015-06-21 DIAGNOSIS — M1990 Unspecified osteoarthritis, unspecified site: Secondary | ICD-10-CM | POA: Diagnosis not present

## 2015-06-21 DIAGNOSIS — K621 Rectal polyp: Secondary | ICD-10-CM | POA: Diagnosis not present

## 2015-06-21 HISTORY — PX: COLONOSCOPY: SHX5424

## 2015-06-21 SURGERY — COLONOSCOPY
Anesthesia: Moderate Sedation

## 2015-06-21 MED ORDER — SODIUM CHLORIDE 0.9 % IV SOLN
INTRAVENOUS | Status: DC
  Administered 2015-06-21: 1000 mL via INTRAVENOUS

## 2015-06-21 MED ORDER — MIDAZOLAM HCL 5 MG/5ML IJ SOLN
INTRAMUSCULAR | Status: DC | PRN
  Administered 2015-06-21: 2 mg via INTRAVENOUS
  Administered 2015-06-21: 1 mg via INTRAVENOUS
  Administered 2015-06-21 (×2): 2 mg via INTRAVENOUS

## 2015-06-21 MED ORDER — SIMETHICONE 40 MG/0.6ML PO SUSP
ORAL | Status: DC | PRN
  Administered 2015-06-21: 10:00:00

## 2015-06-21 MED ORDER — SODIUM CHLORIDE 0.9 % IV SOLN: INTRAVENOUS | Status: DC

## 2015-06-21 MED ORDER — SODIUM CHLORIDE 0.9 % IJ SOLN
INTRAMUSCULAR | Status: AC
  Filled 2015-06-21: qty 250

## 2015-06-21 MED ORDER — MEPERIDINE HCL 100 MG/ML IJ SOLN
INTRAMUSCULAR | Status: AC
  Filled 2015-06-21: qty 2

## 2015-06-21 MED ORDER — MIDAZOLAM HCL 5 MG/5ML IJ SOLN
INTRAMUSCULAR | Status: AC
  Filled 2015-06-21: qty 10

## 2015-06-21 MED ORDER — SODIUM CHLORIDE 0.9 % IV SOLN
INTRAVENOUS | Status: AC
  Filled 2015-06-21: qty 150

## 2015-06-21 MED ORDER — MEPERIDINE HCL 100 MG/ML IJ SOLN
INTRAMUSCULAR | Status: DC | PRN
  Administered 2015-06-21 (×4): 25 mg via INTRAVENOUS

## 2015-06-21 NOTE — Op Note (Addendum)
[facility] [address] [place], [address]   COLONOSCOPY PROCEDURE REPORT  PATIENT: Shannon, Roth  MR#: [id number] BIRTHDATE: November 08, 1965 , 62  yrs. old GENDER: female ENDOSCOPIST: Danie Binder, MD REFERRED IO:[id number] Moshe Cipro, M.D. PROCEDURE DATE:  2015/07/19 PROCEDURE:   Colonoscopy with cold biopsy polypectomy INDICATIONS:average risk patient for colon cancer. MEDICATIONS: Demerol 100 mg IV and Versed 7 mg IV  MD INITIATED SEDATION: 0939. ENDO OF ENDOSCOPY: 1023  DESCRIPTION OF PROCEDURE:    Physical exam was performed.  Informed consent was obtained from the patient after explaining the benefits, risks, and alternatives to procedure.  The patient was connected to monitor and placed in left lateral position. Continuous oxygen was provided by nasal cannula and IV medicine administered through an indwelling cannula.  After administration of sedation and rectal exam, the patients rectum was intubated and the [id number]  colonoscope was advanced under direct visualization to the cecum.  The scope was removed slowly by carefully examining the color, texture, anatomy, and integrity mucosa on the way out.  The patient was recovered in endoscopy and discharged home in satisfactory condition. Estimated blood loss is zero unless otherwise noted in this procedure report.    COLON FINDINGS: Two sessile polyps ranging from 3 to 84mm in size were found in the rectum.  A polypectomy was performed with cold forceps AND COLD SNARE. The colon was redundant. OTHERWISE NORMAL COLON MUCOSA.  Manual abdominal counter-pressure was used to reach the cecum, and Small internal hemorrhoids were found.  PREP QUALITY: good.  CECAL W/D TIME: 15       minutes COMPLICATIONS: None  ENDOSCOPIC IMPRESSION: 1.   Two RECTAL Polyps REMOVED 2.   The LEFT colon IS redundant 3.   Small internal hemorrhoids  RECOMMENDATIONS: AWAIT BIOPSY HIGH FIBER DIET LOSE WEIGHT NEXT TCS  5-10 YEARS     _______________________________ Lorrin MaisDanie Binder, MD 07-19-15 11:17 AM Revised: 07-19-15 11:17 AM   CPT CODES: ICD CODES:  The ICD and CPT codes recommended by this software are interpretations from the data that the clinical staff has captured with the software.  The verification of the translation of this report to the ICD and CPT codes and modifiers is the sole responsibility of the health care institution and practicing physician where this report was generated.  [employer]. will not be held responsible for the validity of the ICD and CPT codes included on this report.  AMA assumes no liability for data contained or not contained herein. CPT is a Designer, television/film set of the [employer].

## 2015-06-21 NOTE — Discharge Instructions (Signed)
You had 2 small polyps removed FROM YOUR RECTUM. You have internal hemorrhoids.   CONTINUE YOUR WEIGHT LOSS EFFORTS. YOUR BODY MASS INDEX IS OVER 40 WHICH MEANS YOU ARE MORBIDLY OBESE. OBESITY IS ASSOCIATED WITH AN INCREASE FOR ALL CANCERS, INCLUDING COLON CANCER.  DRINK WATER TO KEEP YOUR URINE LIGHT YELLOW.  FOLLOW A HIGH FIBER/LOW FAT DIET. AVOID ITEMS THAT CAUSE BLOATING & GAS. SEE INFO BELOW.  YOUR BIOPSY RESULTS WILL BE AVAILABLE IN MY CHART JAN 18 AND MY OFFICE WILL CONTACT YOU IN 10-14 DAYS WITH YOUR RESULTS.   Next colonoscopy in 5-10 years.    Colonoscopy Care After Read the instructions outlined below and refer to this sheet in the next week. These discharge instructions provide you with general information on caring for yourself after you leave the hospital. While your treatment has been planned according to the most current medical practices available, unavoidable complications occasionally occur. If you have any problems or questions after discharge, call DR. Alphonse Asbridge, [phone].  ACTIVITY  You may resume your regular activity, but move at a slower pace for the next 24 hours.   Take frequent rest periods for the next 24 hours.   Walking will help get rid of the air and reduce the bloated feeling in your belly (abdomen).   No driving for 24 hours (because of the medicine (anesthesia) used during the test).   You may shower.   Do not sign any important legal documents or operate any machinery for 24 hours (because of the anesthesia used during the test).    NUTRITION  Drink plenty of fluids.   You may resume your normal diet as instructed by your doctor.   Begin with a light meal and progress to your normal diet. Heavy or fried foods are harder to digest and may make you feel sick to your stomach (nauseated).   Avoid alcoholic beverages for 24 hours or as instructed.    MEDICATIONS  You may resume your normal medications.   WHAT YOU CAN EXPECT  TODAY  Some feelings of bloating in the abdomen.   Passage of more gas than usual.   Spotting of blood in your stool or on the toilet paper  .  IF YOU HAD POLYPS REMOVED DURING THE COLONOSCOPY:  Eat a soft diet IF YOU HAVE NAUSEA, BLOATING, ABDOMINAL PAIN, OR VOMITING.    FINDING OUT THE RESULTS OF YOUR TEST Not all test results are available during your visit. DR. Oneida Alar WILL CALL YOU WITHIN 14 DAYS OF YOUR PROCEDUE WITH YOUR RESULTS. Do not assume everything is normal if you have not heard from DR. Tyishia Aune, CALL HER OFFICE AT [phone].  SEEK IMMEDIATE MEDICAL ATTENTION AND CALL THE OFFICE: [phone] IF:  You have more than a spotting of blood in your stool.   Your belly is swollen (abdominal distention).   You are nauseated or vomiting.   You have a temperature over 101F.   You have abdominal pain or discomfort that is severe or gets worse throughout the day.   High-Fiber Diet A high-fiber diet changes your normal diet to include more whole grains, legumes, fruits, and vegetables. Changes in the diet involve replacing refined carbohydrates with unrefined foods. The calorie level of the diet is essentially unchanged. The Dietary Reference Intake (recommended amount) for adult males is 38 grams per day. For adult females, it is 25 grams per day. Pregnant and lactating women should consume 28 grams of fiber per day. Fiber is the intact part of a plant  that is not broken down during digestion. Functional fiber is fiber that has been isolated from the plant to provide a beneficial effect in the body. PURPOSE  Increase stool bulk.   Ease and regulate bowel movements.   Lower cholesterol.  REDUCE RISK OF COLON CANCER  INDICATIONS THAT YOU NEED MORE FIBER  Constipation and hemorrhoids.   Uncomplicated diverticulosis (intestine condition) and irritable bowel syndrome.   Weight management.   As a protective measure against hardening of the arteries (atherosclerosis),  diabetes, and cancer.   GUIDELINES FOR INCREASING FIBER IN THE DIET  Start adding fiber to the diet slowly. A gradual increase of about 5 more grams (2 slices of whole-wheat bread, 2 servings of most fruits or vegetables, or 1 bowl of high-fiber cereal) per day is best. Too rapid an increase in fiber may result in constipation, flatulence, and bloating.   Drink enough water and fluids to keep your urine clear or pale yellow. Water, juice, or caffeine-free drinks are recommended. Not drinking enough fluid may cause constipation.   Eat a variety of high-fiber foods rather than one type of fiber.   Try to increase your intake of fiber through using high-fiber foods rather than fiber pills or supplements that contain small amounts of fiber.   The goal is to change the types of food eaten. Do not supplement your present diet with high-fiber foods, but replace foods in your present diet.   INCLUDE A VARIETY OF FIBER SOURCES  Replace refined and processed grains with whole grains, canned fruits with fresh fruits, and incorporate other fiber sources. White rice, white breads, and most bakery goods contain little or no fiber.   Brown whole-grain rice, buckwheat oats, and many fruits and vegetables are all good sources of fiber. These include: broccoli, Brussels sprouts, cabbage, cauliflower, beets, sweet potatoes, white potatoes (skin on), carrots, tomatoes, eggplant, squash, berries, fresh fruits, and dried fruits.   Cereals appear to be the richest source of fiber. Cereal fiber is found in whole grains and bran. Bran is the fiber-rich outer coat of cereal grain, which is largely removed in refining. In whole-grain cereals, the bran remains. In breakfast cereals, the largest amount of fiber is found in those with "bran" in their names. The fiber content is sometimes indicated on the label.   You may need to include additional fruits and vegetables each day.   In baking, for 1 cup white flour, you may  use the following substitutions:   1 cup whole-wheat flour minus 2 tablespoons.   1/2 cup white flour plus 1/2 cup whole-wheat flour.   Polyps, Colon  A polyp is extra tissue that grows inside your body. Colon polyps grow in the large intestine. The large intestine, also called the colon, is part of your digestive system. It is a long, hollow tube at the end of your digestive tract where your body makes and stores stool. Most polyps are not dangerous. They are benign. This means they are not cancerous. But over time, some types of polyps can turn into cancer. Polyps that are smaller than a pea are usually not harmful. But larger polyps could someday become or may already be cancerous. To be safe, doctors remove all polyps and test them.   WHO GETS POLYPS? Anyone can get polyps, but certain people are more likely than others. You may have a greater chance of getting polyps if:  You are over 50.   You have had polyps before.   Someone in your family  has had polyps.   Someone in your family has had cancer of the large intestine.   Find out if someone in your family has had polyps. You may also be more likely to get polyps if you:   Eat a lot of fatty foods   Smoke   Drink alcohol   Do not exercise  Eat too much    PREVENTION There is not one sure way to prevent polyps. You might be able to lower your risk of getting them if you:  Eat more fruits and vegetables and less fatty food.   Do not smoke.   Avoid alcohol.   Exercise every day.   Lose weight if you are overweight.   Eating more calcium and folate can also lower your risk of getting polyps. Some foods that are rich in calcium are milk, cheese, and broccoli. Some foods that are rich in folate are chickpeas, kidney beans, and spinach.   Hemorrhoids Hemorrhoids are dilated (enlarged) veins around the rectum. Sometimes clots will form in the veins. This makes them swollen and painful. These are called thrombosed  hemorrhoids. Causes of hemorrhoids include:  Constipation.   Straining to have a bowel movement.   HEAVY LIFTING  HOME CARE INSTRUCTIONS  Eat a well balanced diet and drink 6 to 8 glasses of water every day to avoid constipation. You may also use a bulk laxative.   Avoid straining to have bowel movements.   Keep anal area dry and clean.   Do not use a donut shaped pillow or sit on the toilet for long periods. This increases blood pooling and pain.   Move your bowels when your body has the urge; this will require less straining and will decrease pain and pressure.

## 2015-06-21 NOTE — H&P (Signed)
  Primary Care Physician:  Tula Nakayama, MD Primary Gastroenterologist:  Dr. Oneida Alar  Pre-Procedure History & Physical: HPI:  Shannon Roth is a 50 y.o. female here for [place].  Past Medical History  Diagnosis Date  . Migraine   . Rectal bleeding   . Obesity   . Arthritis   . Cough   . Migraine 2002    Past Surgical History  Procedure Laterality Date  . Total abdominal hysterectomy w/ bilateral salpingoophorectomy    . Cholecystectomy  2003  . Bilateral biopsy breast  2002    benign  . Bilateral tubal ligation  2001  . Eye surgery      right cataract removal   . Tubal ligation      Prior to Admission medications   Medication Sig Start Date End Date Taking? Authorizing Provider  ibuprofen (ADVIL,MOTRIN) 800 MG tablet Take 800 mg by mouth every 8 (eight) hours as needed for mild pain.   Yes Historical Provider, MD  Sod Picosulfate-Mag Ox-Cit Acd 10-3.5-12 MG-GM-GM PACK Take 1 Container by mouth as directed. 05/21/15  Yes Danie Binder, MD  montelukast (SINGULAIR) 10 MG tablet Take 1 tablet (10 mg total) by mouth at bedtime. Patient not taking: Reported on 05/20/2015 04/15/15   Fayrene Helper, MD    Allergies as of 05/21/2015 - Review Complete 05/20/2015  Allergen Reaction Noted  . Codeine  05/10/2008  . Penicillins  09/12/2007    Family History  Problem Relation Age of Onset  . Emphysema Paternal Grandfather   . Emphysema Maternal Grandmother   . Asthma Mother   . Lung cancer Maternal Grandmother     Social History   Social History  . Marital Status: Single    Spouse Name: N/A  . Number of Children: N/A  . Years of Education: N/A   Occupational History  . Social Services    Social History Main Topics  . Smoking status: Never Smoker   . Smokeless tobacco: Never Used  . Alcohol Use: No  . Drug Use: No  . Sexual Activity: Not on file   Other Topics Concern  . Not on file   Social History Narrative    Review of Systems: See  HPI, otherwise negative ROS   Physical Exam: BP 132/68 mmHg  Pulse 67  Temp(Src) 98.7 F (37.1 C) (Oral)  Resp 17  Ht 5\' 8"  (1.727 m)  Wt 330 lb (149.687 kg)  BMI 50.19 kg/m2  SpO2 100% General:   Alert,  pleasant and cooperative in NAD Head:  Normocephalic and atraumatic. Neck:  Supple; Lungs:  Clear throughout to auscultation.    Heart:  Regular rate and rhythm. Abdomen:  Soft, nontender and nondistended. Normal bowel sounds, without guarding, and without rebound.   Neurologic:  Alert and  oriented x4;  grossly normal neurologically.  Impression/Plan:     SCREENING  Plan:  1. TCS TODAY

## 2015-06-24 ENCOUNTER — Encounter (HOSPITAL_COMMUNITY): Payer: Self-pay | Admitting: Gastroenterology

## 2015-07-08 ENCOUNTER — Ambulatory Visit: Payer: BLUE CROSS/BLUE SHIELD | Admitting: Family Medicine

## 2015-07-11 NOTE — Progress Notes (Signed)
Quick Note:  PT is aware of results. ______ 

## 2015-12-03 ENCOUNTER — Telehealth: Payer: Self-pay | Admitting: Pulmonary Disease

## 2015-12-03 ENCOUNTER — Other Ambulatory Visit (INDEPENDENT_AMBULATORY_CARE_PROVIDER_SITE_OTHER): Payer: BLUE CROSS/BLUE SHIELD

## 2015-12-03 ENCOUNTER — Ambulatory Visit
Admission: RE | Admit: 2015-12-03 | Discharge: 2015-12-03 | Disposition: A | Discharge status: Home / Self Care | Payer: BLUE CROSS/BLUE SHIELD | Source: Ambulatory Visit

## 2015-12-03 ENCOUNTER — Ambulatory Visit (INDEPENDENT_AMBULATORY_CARE_PROVIDER_SITE_OTHER): Payer: BLUE CROSS/BLUE SHIELD | Admitting: Pulmonary Disease

## 2015-12-03 ENCOUNTER — Encounter: Payer: Self-pay | Admitting: Pulmonary Disease

## 2015-12-03 ENCOUNTER — Ambulatory Visit: Payer: Self-pay

## 2015-12-03 VITALS — BP 124/76 | HR 59 | Ht 68.0 in | Wt 327.0 lb

## 2015-12-03 DIAGNOSIS — R0602 Shortness of breath: Secondary | ICD-10-CM | POA: Insufficient documentation

## 2015-12-03 DIAGNOSIS — Z1231 Encounter for screening mammogram for malignant neoplasm of breast: Secondary | ICD-10-CM

## 2015-12-03 DIAGNOSIS — K219 Gastro-esophageal reflux disease without esophagitis: Secondary | ICD-10-CM | POA: Diagnosis not present

## 2015-12-03 DIAGNOSIS — Z77090 Contact with and (suspected) exposure to asbestos: Secondary | ICD-10-CM | POA: Diagnosis not present

## 2015-12-03 DIAGNOSIS — R0600 Dyspnea, unspecified: Secondary | ICD-10-CM

## 2015-12-03 LAB — CBC WITH DIFFERENTIAL/PLATELET
Basophils Absolute: 0 10*3/uL (ref 0.0–0.1)
Basophils Relative: 0.4 % (ref 0.0–3.0)
Eosinophils Absolute: 0.1 10*3/uL (ref 0.0–0.7)
Eosinophils Relative: 1.2 % (ref 0.0–5.0)
HCT: 35.8 % — ABNORMAL LOW (ref 36.0–46.0)
Hemoglobin: 10.9 g/dL — ABNORMAL LOW (ref 12.0–15.0)
Lymphocytes Relative: 35.1 % (ref 12.0–46.0)
Lymphs Abs: 2.5 10*3/uL (ref 0.7–4.0)
MCHC: 30.4 g/dL (ref 30.0–36.0)
MCV: 74.6 fl — ABNORMAL LOW (ref 78.0–100.0)
Monocytes Absolute: 0.7 10*3/uL (ref 0.1–1.0)
Monocytes Relative: 10.6 % (ref 3.0–12.0)
Neutro Abs: 3.7 10*3/uL (ref 1.4–7.7)
Neutrophils Relative %: 52.7 % (ref 43.0–77.0)
Platelets: 307 10*3/uL (ref 150.0–400.0)
RBC: 4.8 Mil/uL (ref 3.87–5.11)
RDW: 14 % (ref 11.5–15.5)
WBC: 7 10*3/uL (ref 4.0–10.5)

## 2015-12-03 MED ORDER — RANITIDINE HCL 150 MG PO TABS: 150.0000 mg | ORAL_TABLET | Freq: Every day | ORAL | Status: DC

## 2015-12-03 NOTE — Patient Instructions (Signed)
   Call me if you have any new breathing problems before your next appointment.  We will review your test results at your next appointment.  I will see you back in 4-6 weeks with breathing test at that time.  TESTS ORDERED: 1. Full pulmonary function testing at follow-up appointment 2. 6 minute walk test on room air at follow-up appointment 3. CT chest without contrast 4. Serum CBC today

## 2015-12-03 NOTE — Telephone Encounter (Signed)
PFT 04/19/15: FVC 2.41 L (71%) FEV1 1.98 L (73%) FEV1/FVC 0.82 FEF 25-75 2.04 L (74%) no bronchodilator response TLC 3.79 L (67%) RV 90% DLCO uncorrected 56%  IMAGING CXR PA/LAT 03/21/15 (personally reviewed by me): No parenchymal nodule or opacity. Low lung volumes. Fullness to bilateral hilum. Heart normal in size & mediastinum normal in contour. No pleural effusion.

## 2015-12-03 NOTE — Progress Notes (Signed)
Subjective:    Patient ID: Shannon Roth, female    DOB: 1965/10/03, 50 y.o.   MRN: [id number]  HPI She reports she started having breathing problems over a year ago. She has noticed progressively worsening dyspnea on exertion over the last 3-6 months. She reports that after walking only 10 feet she has to stop with dyspnea. She denies any dyspnea at rest. She has been trying to walk for exercise. She reports that she has coughing associated with her dyspnea. Sometimes she will have post-tussive emesis. She reports she was seen in the ED twice from September to November with her dyspnea. She reports she did have wheezing recently during a respiratory illness per her report. She reports she will wake up with breathing problems 3-5 days out of a month. She reports she was prescribed a liquid cough medication that helps her cough & makes her sleep. She reports when she uses her albuterol inhaler or nebulized breathing treatments it does help her breathing. No history of asthma or breathing problems as a child. Reports bronchitis 2-3 times a year regularly. No chest tightness, pain, or pressure. She reports she has occasional allergies but denies any seasonal sinus congestion or pressure. No rashes, eczema or bruising. Reports rare hot & cold chills as well as sweats.   Review of Systems No reflux or dyspepsia. Does have frequent morning brash water taste. She reports swelling in both feet & pain in her right knee. No other joint pain, swelling, or stiffness. A pertinent 14 point review of systems is negative except as per the history of presenting illness.  Allergies  Allergen Reactions  . Codeine Nausea And Vomiting  . Penicillins Rash    Current Outpatient Prescriptions on File Prior to Visit  Medication Sig Dispense Refill  . ibuprofen (ADVIL,MOTRIN) 800 MG tablet Take 800 mg by mouth every 8 (eight) hours as needed for mild pain.     No current facility-administered medications on file prior  to visit.    Past Medical History  Diagnosis Date  . Migraine   . Rectal bleeding   . Obesity   . Arthritis   . Cough   . Migraine 2002    Past Surgical History  Procedure Laterality Date  . Total abdominal hysterectomy w/ bilateral salpingoophorectomy    . Cholecystectomy  2003  . Bilateral biopsy breast  2002    benign  . Bilateral tubal ligation  2001  . Eye surgery      right cataract removal   . Tubal ligation    . Colonoscopy N/A 06/21/2015    Procedure: COLONOSCOPY;  Surgeon: Danie Binder, MD;  Location: [facility];  Service: Endoscopy;  Laterality: N/A;  8:30 AM - moved to 9:30 - pt knows to arrive at 8:30    Family History  Problem Relation Age of Onset  . Emphysema Paternal Grandfather   . Emphysema Maternal Grandmother   . Lung cancer Maternal Grandmother   . Asthma Mother   . Diabetes Mother   . Arthritis Mother   . Heart disease Mother   . Hypertension Father   . Breast cancer Sister   . Stroke Sister   . Diabetes Brother   . Heart disease Brother     Social History   Social History  . Marital Status: Single    Spouse Name: N/A  . Number of Children: N/A  . Years of Education: N/A   Occupational History  . Social Services    Social  History Main Topics  . Smoking status: Passive Smoke Exposure - Never Smoker  . Smokeless tobacco: Never Used     Comment: Father & close friend  . Alcohol Use: No  . Drug Use: No  . Sexual Activity: Not Asked   Other Topics Concern  . None   Social History Narrative   Originally from [place]. 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 She reports that she wore a full body suit but questionable respiratory equipment for protection. No bird, mold, or hot tub exposure.       Objective:   Physical Exam BP 124/76 mmHg  Pulse 59  Ht 5\' 8"  (1.727 m)  Wt 327 lb (148.326 kg)  BMI 49.73 kg/m2  SpO2 100% General:  Awake. Alert. No acute distress. Morbidly obese. Integument:  Warm & dry. No  rash on exposed skin. No bruising. Lymphatics:  No appreciated cervical or supraclavicular lymphadenoapthy. HEENT:  Moist mucus membranes. No oral ulcers. No scleral injection or icterus. Mild bilateral nasal turbinate swelling. Cardiovascular:  Regular rate. No edema. No appreciable JVD.  Pulmonary:  Good aeration & clear to auscultation bilaterally. Symmetric chest wall expansion. No accessory muscle use. Abdomen: Soft. Normal bowel sounds. Protuberant. Grossly nontender. Musculoskeletal:  Normal bulk and tone. Hand grip strength 5/5 bilaterally. No joint deformity or effusion appreciated. Neurological:  CN 2-12 grossly in tact. No meningismus. Moving all 4 extremities equally. Symmetric brachioradialis deep tendon reflexes. Psychiatric:  Mood and affect congruent. Speech normal rhythm, rate & tone.   PFT 04/19/15: FVC 2.41 L (71%) FEV1 1.98 L (73%) FEV1/FVC 0.82 FEF 25-75 2.04 L (74%) no bronchodilator response TLC 3.79 L (67%) RV 90% DLCO uncorrected 56%  IMAGING CXR PA/LAT 03/21/15 (personally reviewed by me): No parenchymal nodule or opacity. Low lung volumes. Fullness to bilateral hilum. Heart normal in size & mediastinum normal in contour. No pleural effusion.    Assessment & Plan:  50 year old morbidly obese female presenting with progressively worsening dyspnea on exertion. With her history of asbestos exposure I feel it's reasonable to rule out parenchymal disease with chest CT imaging. I personally reviewed her previous pulmonary function testing and chest x-ray. Her lung volumes did show mild restriction which could be due to her underlying obesity or possibly parenchymal involvement of her asbestos exposure. The reduction in carbon monoxide diffusion capacity is unclear and will need to be further addressed with CT imaging as well as a 6 minute test. I instructed the patient contact my office if she had any new breathing problems or questions before her next appointment.  1. Dyspnea:  Repeat full pulmonary function testing at follow-up appointment as well as 6 minute walk test on room air. Checking serum CBC today. 2. 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 Checking CT chest without contrast. 3. H/O Asbestos Exposure: Checking CT chest without contrast and repeat full pulmonary function testing at follow-up appointment. 4. GERD: Starting patient on Zantac 150 mg by mouth daily at bedtime. 5. Follow-up: Patient to return to clinic in 4-6 weeks.  Sonia Baller Ashok Cordia, M.D. [facility] Pager:  [phone] After 3pm or if no response, call [phone] 3:35 PM 12/03/2015

## 2015-12-09 ENCOUNTER — Telehealth: Payer: Self-pay | Admitting: Pulmonary Disease

## 2015-12-09 ENCOUNTER — Ambulatory Visit (INDEPENDENT_AMBULATORY_CARE_PROVIDER_SITE_OTHER)
Admission: RE | Admit: 2015-12-09 | Discharge: 2015-12-09 | Disposition: A | Discharge status: Home / Self Care | Payer: BLUE CROSS/BLUE SHIELD | Source: Ambulatory Visit | Attending: Pulmonary Disease | Admitting: Pulmonary Disease

## 2015-12-09 DIAGNOSIS — Z77090 Contact with and (suspected) exposure to asbestos: Secondary | ICD-10-CM | POA: Diagnosis not present

## 2015-12-09 DIAGNOSIS — R0600 Dyspnea, unspecified: Secondary | ICD-10-CM

## 2015-12-09 NOTE — Telephone Encounter (Signed)
There are no acute findings on this scan that required anything but make sure she has f/u with dr Ashok Cordia as her dyspnea is not expalined.    She has an incidental area on the liver that is likely nothing at all but also needs elective (non-urgent) f/u but that can be addressed also by Dr Ashok Cordia

## 2015-12-09 NOTE — Telephone Encounter (Signed)
JN please advise. Thanks. 

## 2015-12-09 NOTE — Telephone Encounter (Signed)
Received Call Report from [facility]:       CLINICAL DATA: Worsening shortness of breath with exertion, cough, asbestos exposure.  EXAM: CT CHEST WITHOUT CONTRAST  TECHNIQUE: Multidetector CT imaging of the chest was performed following the standard protocol without IV contrast.  COMPARISON: None.  FINDINGS: Cardiovascular: Grossly within normal limits without IV contrast. Heart is at the upper limits of normal in size. No pericardial effusion.  Mediastinum/Nodes: No pathologically enlarged mediastinal or axillary lymph nodes. Hilar regions are difficult to definitively evaluate without IV contrast. No internal mammary adenopathy.  Lungs/Pleura: Lungs are clear. No pleural fluid. Airway is unremarkable.  Upper Abdomen: A somewhat poorly defined low-attenuation lesion in the right hepatic lobe measures approximately 5.0 cm (series 2, image 107). Cholecystectomy. Visualized portions of the adrenal glands, kidneys, spleen, pancreas, stomach and bowel are grossly unremarkable. No upper abdominal adenopathy.  Musculoskeletal: No worrisome lytic or sclerotic lesions. A nodule in the left breast measures 2.9 cm.  IMPRESSION: 1. No findings to explain the patient's symptoms. 2. Left breast nodule. Patient recently had a negative screening mammogram on 12/03/2015. Correlation with physical exam and possibly ultrasound are recommended. 3. Somewhat poorly defined low-attenuation lesion in the right hepatic lobe, indeterminate. Further evaluation with MR abdomen without and with contrast is recommended, as clinically indicated. 4. These results will be called to the ordering clinician or representative by the Radiologist Assistant, and communication documented in the PACS or zVision Dashboard.   Electronically Signed  By: Lorin Picket M.D.  On: 12/09/2015 16:22    Dr. Melvyn Novas, please advise in Dr. Ammie Dalton absence.

## 2015-12-15 NOTE — Telephone Encounter (Signed)
Please let the patient know her CT scan shows a nearly 3cm nodule in her left breast as well as a 5cm spot in her liver. Her mammogram from June was negative but this lesion needs to be addressed by her PCP as does her liver lesion. We will review her CT scan formally at her follow-up appointment. Thanks.

## 2015-12-16 NOTE — Telephone Encounter (Signed)
Lmtcb x1

## 2015-12-16 NOTE — Telephone Encounter (Signed)
Called spoke with pt. Reviewed JN's results and recs. She voiced understanding and had no further questions. Nothing further needed at this time.

## 2015-12-17 ENCOUNTER — Telehealth: Payer: Self-pay

## 2015-12-17 DIAGNOSIS — Z0000 Encounter for general adult medical examination without abnormal findings: Secondary | ICD-10-CM

## 2015-12-17 NOTE — Telephone Encounter (Signed)
Labs ordered for next appt and mailed along with appt card

## 2015-12-17 NOTE — Telephone Encounter (Signed)
-----   Message from [place] sent at 12/09/2015  8:44 AM EDT ----- Regarding: RE: pls sched annual cpe end August, needs appt, will also need fasting l;abss befopre visit Loma Sousa I have her scheduled for Aug 29 at 3:00, will you please get lab orders ready and I will mail both appt card and lab orders together, Thanks!!  ----- Message -----    From: Fayrene Helper, MD    Sent: 12/06/2015  12:22 PM      To: Michelene Gardener [place] Subject: pls sched annual cpe end August, needs appt,#

## 2015-12-19 ENCOUNTER — Ambulatory Visit (INDEPENDENT_AMBULATORY_CARE_PROVIDER_SITE_OTHER): Payer: BLUE CROSS/BLUE SHIELD | Admitting: Family Medicine

## 2015-12-19 ENCOUNTER — Encounter: Payer: Self-pay | Admitting: Family Medicine

## 2015-12-19 VITALS — BP 122/80 | HR 63 | Resp 16 | Ht 68.0 in | Wt 327.0 lb

## 2015-12-19 DIAGNOSIS — K7689 Other specified diseases of liver: Secondary | ICD-10-CM | POA: Diagnosis not present

## 2015-12-19 DIAGNOSIS — R05 Cough: Secondary | ICD-10-CM

## 2015-12-19 DIAGNOSIS — D649 Anemia, unspecified: Secondary | ICD-10-CM | POA: Diagnosis not present

## 2015-12-19 DIAGNOSIS — E8881 Metabolic syndrome: Secondary | ICD-10-CM | POA: Diagnosis not present

## 2015-12-19 DIAGNOSIS — N63 Unspecified lump in unspecified breast: Secondary | ICD-10-CM

## 2015-12-19 DIAGNOSIS — G43009 Migraine without aura, not intractable, without status migrainosus: Secondary | ICD-10-CM

## 2015-12-19 DIAGNOSIS — E559 Vitamin D deficiency, unspecified: Secondary | ICD-10-CM

## 2015-12-19 DIAGNOSIS — R059 Cough, unspecified: Secondary | ICD-10-CM

## 2015-12-19 DIAGNOSIS — K769 Liver disease, unspecified: Secondary | ICD-10-CM | POA: Insufficient documentation

## 2015-12-19 NOTE — Assessment & Plan Note (Addendum)
Lesion 5cm moted on recent scan, MRI imaging recommended, will refer to  GI who did recent colonoscopy to manage as deemed necessary, also updated LFT to be obtained within the next 1 week

## 2015-12-19 NOTE — Patient Instructions (Signed)
Annual physical exam as before  You are referred urgently for breast US , we will call with appt info  You are referred to Dr Oneida Alar re liver lesion   Fasting lipid, cmp and eGFr, TSH , anemia panel and vit D as soon as possible please  Pls get TDAP on job   Please work on good  health habits so that your health will improve. 1. Commitment to daily physical activity for 30 to 60  minutes, if you are able to do this.  2. Commitment to wise food choices. Aim for half of your  food intake to be vegetable and fruit, one quarter starchy foods, and one quarter protein. Try to eat on a regular schedule  3 meals per day, snacking between meals should be limited to vegetables or fruits or small portions of nuts. 64 ounces of water per day is generally recommended, unless you have specific health conditions, like heart failure or kidney failure where you will need to limit fluid intake.  3. Commitment to sufficient and a  good quality of physical and mental rest daily, generally between 6 to 8 hours per day.  WITH PERSISTANCE AND PERSEVERANCE, THE IMPOSSIBLE , BECOMES THE NORM! Thank you  for choosing [facility]. We consider it a privelige to serve you.  Delivering excellent health care in a caring and  compassionate way is our goal.  Partnering with you,  so that together we can achieve this goal is our strategy.

## 2015-12-20 NOTE — Assessment & Plan Note (Signed)
Controlled, no change in medication  

## 2015-12-20 NOTE — Assessment & Plan Note (Signed)
Currently being managed and evaluated by pulmonary

## 2015-12-20 NOTE — Progress Notes (Signed)
   Shannon Roth     MRN: [id number]      DOB: Feb 11, 1966   HPI Shannon Roth is here with primary concern regarding abnormal imaging study . She is being evaluated by pulmonary specialist for chronic cough, recent lung scan that was orderd described left breast nodule, though a mammogram that she had that same day, did not report this, also, a 5 cm liver lesion is described. She had a colonoscopy in the past year which was normal, she denies abdominal pain States htat she has a younger sister who reports having abnormal breast cells and has planned mastectomy  ROS Denies recent fever or chills. Denies sinus pressure, nasal congestion, ear pain or sore throat. Denies chest congestion, productive cough or wheezing. Denies chest pains, palpitations and leg swelling Denies abdominal pain, nausea, vomiting,diarrhea or constipation.   Denies dysuria, frequency, hesitancy or incontinence. Denies joint pain, swelling and limitation in mobility. Denies uncontrolled  headaches, seizures, numbness, or tingling. Denies depression, anxiety or insomnia. Denies skin break down or rash.   PE  BP 122/80 mmHg  Pulse 63  Resp 16  Ht 5\' 8"  (1.727 m)  Wt 327 lb (148.326 kg)  BMI 49.73 kg/m2  SpO2 100%  Patient alert and oriented and in no cardiopulmonary distress.  HEENT: No facial asymmetry, EOMI,   oropharynx pink and moist.  Neck supple no JVD, no mass.  Chest: Clear to auscultation bilaterally.  CVS: S1, S2 no murmurs, no S3.Regular rate.  ABD: Soft non tender.   Ext: No edema  MS: Adequate ROM spine, shoulders, hips and knees.  Skin: Intact, no ulcerations or rash noted.  Psych: Good eye contact, normal affect. Memory intact mildly anxious not  depressed appearing.  CNS: CN 2-12 intact, power,  normal throughout.no focal deficits noted.   Assessment & Plan  Liver lesion, right lobe Lesion 5cm moted on recent scan, MRI imaging recommended, will refer to  GI who did recent  colonoscopy to manage as deemed necessary, also updated LFT to be obtained within the next 1 week  Breast nodule Incidental finding on recent lung scan, with normal mammogram reported on the same date. Pt reports f/h of possible breast cancer in a younger sister. Needs ultrasound of affected breast for further evaluatiion  Morbid obesity Unchanged. Patient re-educated about  the importance of commitment to a  minimum of 150 minutes of exercise per week.  The importance of healthy food choices with portion control discussed. Encouraged to start a food diary, count calories and to consider  joining a support group. Sample diet sheets offered. Goals set by the patient for the next several months.   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

## 2015-12-20 NOTE — Assessment & Plan Note (Signed)
Unchanged. Patient re-educated about  the importance of commitment to a  minimum of 150 minutes of exercise per week.  The importance of healthy food choices with portion control discussed. Encouraged to start a food diary, count calories and to consider  joining a support group. Sample diet sheets offered. Goals set by the patient for the next several months.   Weight /BMI 12/19/2015 12/03/2015 06/21/2015  WEIGHT 327 lb 327 lb 330 lb  HEIGHT 5\' 8"  5\' 8"  5\' 8"   BMI 49.73 kg/m2 49.73 kg/m2 50.19 kg/m2

## 2015-12-20 NOTE — Assessment & Plan Note (Signed)
Incidental finding on recent lung scan, with normal mammogram reported on the same date. Pt reports f/h of possible breast cancer in a younger sister. Needs ultrasound of affected breast for further evaluatiion

## 2015-12-23 ENCOUNTER — Telehealth: Payer: Self-pay | Admitting: Gastroenterology

## 2015-12-23 NOTE — Telephone Encounter (Signed)
PT NEEDS OPV @1130  JUL 19 OR JUL 20 DX: 5 CM LIVER LESION.

## 2015-12-23 NOTE — Telephone Encounter (Signed)
PT HAS APPOINTMENT 12/24/15 AT 10:00 WITH LESLIE, IS THIS OK?

## 2015-12-24 ENCOUNTER — Encounter: Payer: Self-pay | Admitting: Gastroenterology

## 2015-12-24 ENCOUNTER — Ambulatory Visit
Admission: RE | Admit: 2015-12-24 | Discharge: 2015-12-24 | Disposition: A | Discharge status: Home / Self Care | Payer: BLUE CROSS/BLUE SHIELD | Source: Ambulatory Visit | Attending: Family Medicine | Admitting: Family Medicine

## 2015-12-24 ENCOUNTER — Ambulatory Visit (INDEPENDENT_AMBULATORY_CARE_PROVIDER_SITE_OTHER): Payer: BLUE CROSS/BLUE SHIELD | Admitting: Gastroenterology

## 2015-12-24 ENCOUNTER — Telehealth: Payer: Self-pay | Admitting: Gastroenterology

## 2015-12-24 VITALS — BP 122/80 | Temp 98.7°F | Ht 68.0 in | Wt 323.2 lb

## 2015-12-24 DIAGNOSIS — K219 Gastro-esophageal reflux disease without esophagitis: Secondary | ICD-10-CM | POA: Diagnosis not present

## 2015-12-24 DIAGNOSIS — D509 Iron deficiency anemia, unspecified: Secondary | ICD-10-CM | POA: Diagnosis not present

## 2015-12-24 DIAGNOSIS — N63 Unspecified lump in unspecified breast: Secondary | ICD-10-CM

## 2015-12-24 DIAGNOSIS — R932 Abnormal findings on diagnostic imaging of liver and biliary tract: Secondary | ICD-10-CM | POA: Diagnosis not present

## 2015-12-24 MED ORDER — OMEPRAZOLE 20 MG PO CPDR: 20.0000 mg | DELAYED_RELEASE_CAPSULE | Freq: Every day | ORAL | Status: DC

## 2015-12-24 MED ORDER — DIAZEPAM 10 MG PO TABS: 10.0000 mg | ORAL_TABLET | Freq: Once | ORAL | Status: DC

## 2015-12-24 NOTE — Assessment & Plan Note (Signed)
Microcytic anemia. She had similar numbers 6 years ago per Epic. She is up-to-date on her colonoscopy. Await pending anemia profile. Further recommendations to follow.

## 2015-12-24 NOTE — Assessment & Plan Note (Signed)
Typical GERD symptoms not well controlled on H2 blocker. Switched to omeprazole 20 mg 30 minutes before breakfast daily. Discussed antireflux measures.

## 2015-12-24 NOTE — Patient Instructions (Signed)
1. I will follow up on your upcoming labs once done. 2. Please have your MRI done. You should take valium one tablet 60 minutes before scheduled MRI. 3. Stop zantac. Start omeprazole once daily before breakfast for acid reflux.

## 2015-12-24 NOTE — Progress Notes (Signed)
Primary Care Physician:  Tula Nakayama, MD  Primary Gastroenterologist:  Barney Drain, MD   Chief Complaint  Patient presents with  . Abnormal CT    HPI:  Shannon Roth is a 50 y.o. female here At the request of Dr. Moshe Cipro for further evaluation of abnormal CT scan. She had a chest CT without contrast by her pulmonologist for further evaluation of dyspnea. This study showed a left breast nodule, patient had a negative screening mammogram a few days prior. She is having an ultrasound later this afternoon for further evaluation. She also had a somewhat poorly defined low attenuation lesion in the right hepatic lobe, indeterminate. MRI abdomen recommended. Patient states that she has no abdominal pain. She occasionally has vomiting related to migraine headaches or deep coughing. She has some heartburn and recently started Zantac by her pulmonologist but really hasn't noted any improvement. Bowel function is normal. No blood in the stool or melena. No menstrual cycle status post hysterectomy. Denies prior EGD.  Labs 3 weeks ago showed a hemoglobin of 10.9, MCV 74.6 low. She has further iron/anemia labs ordered by Dr. Moshe Cipro, plans to go have them done through work.  TCS earlier this year as outlined below.  Current Outpatient Prescriptions  Medication Sig Dispense Refill  . albuterol (VENTOLIN HFA) 108 (90 Base) MCG/ACT inhaler Inhale 2 puffs into the lungs every 6 (six) hours as needed for wheezing or shortness of breath.    . ranitidine (ZANTAC) 150 MG tablet Take 1 tablet (150 mg total) by mouth at bedtime. 30 tablet 3   No current facility-administered medications for this visit.    Allergies as of 12/24/2015 - Review Complete 12/24/2015  Allergen Reaction Noted  . Codeine Nausea And Vomiting 05/10/2008  . Penicillins Rash 09/12/2007    Past Medical History  Diagnosis Date  . Migraine   . Rectal bleeding   . Obesity   . Arthritis   . Cough   . Migraine 2002    Past  Surgical History  Procedure Laterality Date  . Total abdominal hysterectomy w/ bilateral salpingoophorectomy  2003  . Cholecystectomy  2003  . Bilateral biopsy breast  2002    benign  . Bilateral tubal ligation  2001  . Eye surgery      right cataract removal   . Tubal ligation    . Colonoscopy N/A 06/21/2015    SLF: hyperplastic polyps removed. next TCS 10 years    Family History  Problem Relation Age of Onset  . Emphysema Paternal Grandfather   . Emphysema Maternal Grandmother   . Lung cancer Maternal Grandmother   . Asthma Mother   . Diabetes Mother   . Arthritis Mother   . Heart disease Mother   . Hypertension Father   . Breast cancer Sister   . Stroke Sister   . Diabetes Brother   . Heart disease Brother   . Colon cancer Paternal Uncle     57s  . Liver disease Neg Hx     Social History   Social History  . Marital Status: Single    Spouse Name: N/A  . Number of Children: 1  . Years of Education: N/A   Occupational History  . Social Services    Social History Main Topics  . Smoking status: Passive Smoke Exposure - Never Smoker  . Smokeless tobacco: Never Used     Comment: Father & close friend  . Alcohol Use: No  . Drug Use: No  . Sexual Activity: Not  on file   Other Topics Concern  . Not on file   Social History Narrative   Originally from [place]. 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 She reports that she wore a full body suit but questionable respiratory equipment for protection. No bird, mold, or hot tub exposure.       ROS:  General: Negative for anorexia, weight loss, fever, chills, fatigue, weakness. Eyes: Negative for vision changes.  ENT: Negative for hoarseness, difficulty swallowing , nasal congestion. CV: Negative for chest pain, angina, palpitations, dyspnea on exertion, peripheral edema.  Respiratory: Negative for dyspnea at rest, dyspnea on exertion, cough, sputum, wheezing.  GI: See history of present  illness. GU:  Negative for dysuria, hematuria, urinary incontinence, urinary frequency, nocturnal urination.  MS: Negative for joint pain, low back pain.  Derm: Negative for rash or itching.  Neuro: Negative for weakness, abnormal sensation, seizure, frequent headaches, memory loss, confusion.  Psych: Negative for anxiety, depression, suicidal ideation, hallucinations.  Endo: Negative for unusual weight change.  Heme: Negative for bruising or bleeding. Allergy: Negative for rash or hives.    Physical Examination:  BP 122/80 mmHg  Temp(Src) 98.7 F (37.1 C) (Oral)  Ht 5\' 8"  (1.727 m)  Wt 323 lb 3.2 oz (146.603 kg)  BMI 49.15 kg/m2   General: Well-nourished, well-developed in no acute distress.  Head: Normocephalic, atraumatic.   Eyes: Conjunctiva pink, no icterus. Mouth: Oropharyngeal mucosa moist and pink , no lesions erythema or exudate. Neck: Supple without thyromegaly, masses, or lymphadenopathy.  Lungs: Clear to auscultation bilaterally.  Heart: Regular rate and rhythm, no murmurs rubs or gallops.  Abdomen: Bowel sounds are normal, nontender, nondistended, no hepatosplenomegaly or masses, no abdominal bruits or    hernia , no rebound or guarding.   Rectal: not performed Extremities: No lower extremity edema. No clubbing or deformities.  Neuro: Alert and oriented x 4 , grossly normal neurologically.  Skin: Warm and dry, no rash or jaundice.   Psych: Alert and cooperative, normal mood and affect.  Labs: Lab Results  Component Value Date   WBC 7.0 12/03/2015   HGB 10.9* 12/03/2015   HCT 35.8* 12/03/2015   MCV 74.6* 12/03/2015   PLT 307.0 12/03/2015      Imaging Studies: Ct Chest Wo Contrast  12/09/2015  CLINICAL DATA:  Worsening shortness of breath with exertion, cough, asbestos exposure. EXAM: CT CHEST WITHOUT CONTRAST TECHNIQUE: Multidetector CT imaging of the chest was performed following the standard protocol without IV contrast. COMPARISON:  None. FINDINGS:  Cardiovascular: Grossly within normal limits without IV contrast. Heart is at the upper limits of normal in size. No pericardial effusion. Mediastinum/Nodes: No pathologically enlarged mediastinal or axillary lymph nodes. Hilar regions are difficult to definitively evaluate without IV contrast. No internal mammary adenopathy. Lungs/Pleura: Lungs are clear. No pleural fluid. Airway is unremarkable. Upper Abdomen: A somewhat poorly defined low-attenuation lesion in the right hepatic lobe measures approximately 5.0 cm (series 2, image 107). Cholecystectomy. Visualized portions of the adrenal glands, kidneys, spleen, pancreas, stomach and bowel are grossly unremarkable. No upper abdominal adenopathy. Musculoskeletal: No worrisome lytic or sclerotic lesions. A nodule in the left breast measures 2.9 cm. IMPRESSION: 1. No findings to explain the patient's symptoms. 2. Left breast nodule. Patient recently had a negative screening mammogram on 12/03/2015. Correlation with physical exam and possibly ultrasound are recommended. 3. Somewhat poorly defined low-attenuation lesion in the right hepatic lobe, indeterminate. Further evaluation with MR abdomen without and with contrast is recommended, as clinically indicated.  4. These results will be called to the ordering clinician or representative by the Radiologist Assistant, and communication documented in the PACS or zVision Dashboard. Electronically Signed   By: Lorin Picket M.D.   On: 12/09/2015 16:22   Mm Screening Breast Tomo Bilateral  12/06/2015  CLINICAL DATA:  Screening. EXAM: 2D DIGITAL SCREENING BILATERAL MAMMOGRAM WITH CAD AND ADJUNCT TOMO COMPARISON:  Previous exam(s). ACR Breast Density Category b: There are scattered areas of fibroglandular density. FINDINGS: There are no findings suspicious for malignancy. Images were processed with CAD. IMPRESSION: No mammographic evidence of malignancy. A result letter of this screening mammogram will be mailed directly to  the patient. RECOMMENDATION: Screening mammogram in one year. (Code:SM-B-01Y) BI-RADS CATEGORY  1: Negative. Electronically Signed   By: Abelardo Diesel M.D.   On: 12/06/2015 09:35

## 2015-12-24 NOTE — Assessment & Plan Note (Signed)
Poorly defined 5 cm right hepatic lobe lesion on recent noncontrast chest CT. Further evaluation needed. Patient is claustrophobic and weight close to 350 pounds. Attempt for MRI liver at [facility], wide bore machine with oral sedation.

## 2015-12-24 NOTE — Telephone Encounter (Signed)
Opened in error

## 2015-12-24 NOTE — Progress Notes (Signed)
CC'ED TO PCP 

## 2015-12-27 ENCOUNTER — Ambulatory Visit (HOSPITAL_COMMUNITY)
Admission: RE | Admit: 2015-12-27 | Discharge: 2015-12-27 | Disposition: A | Discharge status: Home / Self Care | Payer: BLUE CROSS/BLUE SHIELD | Source: Ambulatory Visit | Attending: Gastroenterology | Admitting: Gastroenterology

## 2015-12-27 DIAGNOSIS — D1803 Hemangioma of intra-abdominal structures: Secondary | ICD-10-CM | POA: Insufficient documentation

## 2015-12-27 DIAGNOSIS — R932 Abnormal findings on diagnostic imaging of liver and biliary tract: Secondary | ICD-10-CM | POA: Diagnosis present

## 2015-12-27 DIAGNOSIS — K760 Fatty (change of) liver, not elsewhere classified: Secondary | ICD-10-CM | POA: Diagnosis not present

## 2015-12-27 DIAGNOSIS — R160 Hepatomegaly, not elsewhere classified: Secondary | ICD-10-CM | POA: Diagnosis not present

## 2015-12-27 LAB — CREATININE, SERUM
Creatinine, Ser: 0.96 mg/dL (ref 0.44–1.00)
GFR calc Af Amer: >60 mL/min (ref >60)
GFR calc non Af Amer: >60 mL/min (ref >60)

## 2015-12-27 MED ORDER — GADOXETATE DISODIUM 0.25 MMOL/ML IV SOLN
9.0000 mL | Freq: Once | INTRAVENOUS | Status: AC | PRN
  Administered 2015-12-27: 9 mL via INTRAVENOUS

## 2015-12-30 NOTE — Progress Notes (Signed)
Pt is aware of results. 

## 2015-12-30 NOTE — Progress Notes (Signed)
Please let patient know that she two hepatic hemangiomas (benign collection of blood vessels in the liver), including a large one that corresponded to the CT abnormality. Her liver is also somewhat enlarged and fatty.  Await upcoming labs.

## 2015-12-30 NOTE — Progress Notes (Signed)
LMOM to call.

## 2016-01-02 ENCOUNTER — Ambulatory Visit (HOSPITAL_COMMUNITY): Payer: BLUE CROSS/BLUE SHIELD

## 2016-02-04 ENCOUNTER — Encounter: Payer: BLUE CROSS/BLUE SHIELD | Admitting: Family Medicine

## 2016-02-19 ENCOUNTER — Ambulatory Visit (INDEPENDENT_AMBULATORY_CARE_PROVIDER_SITE_OTHER): Payer: BLUE CROSS/BLUE SHIELD | Admitting: Pulmonary Disease

## 2016-02-19 ENCOUNTER — Encounter: Payer: Self-pay | Admitting: Pulmonary Disease

## 2016-02-19 VITALS — BP 122/74 | HR 61 | Ht 67.0 in | Wt 328.0 lb

## 2016-02-19 DIAGNOSIS — J984 Other disorders of lung: Secondary | ICD-10-CM

## 2016-02-19 DIAGNOSIS — R0600 Dyspnea, unspecified: Secondary | ICD-10-CM | POA: Diagnosis not present

## 2016-02-19 DIAGNOSIS — R0609 Other forms of dyspnea: Secondary | ICD-10-CM

## 2016-02-19 DIAGNOSIS — K219 Gastro-esophageal reflux disease without esophagitis: Secondary | ICD-10-CM

## 2016-02-19 DIAGNOSIS — Z77090 Contact with and (suspected) exposure to asbestos: Secondary | ICD-10-CM

## 2016-02-19 LAB — PULMONARY FUNCTION TEST
DL/VA % pred: 84 %
DL/VA: 4.43 ml/min/mmHg/L
DLCO unc % pred: 62 %
DLCO unc: 18.54 ml/min/mmHg
FEF 25-75 Post: 3.76 L/sec
FEF 25-75 Pre: 2.32 L/sec
FEF2575-%Change-Post: 62 %
FEF2575-%Pred-Post: 139 %
FEF2575-%Pred-Pre: 85 %
FEV1-%Change-Post: 12 %
FEV1-%Pred-Post: 94 %
FEV1-%Pred-Pre: 84 %
FEV1-Post: 2.53 L
FEV1-Pre: 2.24 L
FEV1FVC-%Change-Post: 3 %
FEV1FVC-%Pred-Pre: 101 %
FEV6-%Change-Post: 9 %
FEV6-%Pred-Post: 91 %
FEV6-%Pred-Pre: 83 %
FEV6-Post: 2.97 L
FEV6-Pre: 2.72 L
FEV6FVC-%Pred-Post: 103 %
FEV6FVC-%Pred-Pre: 103 %
FVC-%Change-Post: 9 %
FVC-%Pred-Post: 89 %
FVC-%Pred-Pre: 81 %
FVC-Post: 2.97 L
FVC-Pre: 2.72 L
Post FEV1/FVC ratio: 85 %
Post FEV6/FVC ratio: 100 %
Pre FEV1/FVC ratio: 82 %
Pre FEV6/FVC Ratio: 100 %
RV % pred: 89 %
RV: 1.79 L
TLC % pred: 80 %
TLC: 4.58 L

## 2016-02-19 NOTE — Progress Notes (Signed)
Subjective:    Patient ID: Shannon Roth, female    DOB: 1966-02-04, 50 y.o.   MRN: [id number]  C.C.:  Follow-up for Dyspnea, Mild Restrictive Lung Disease, H/O Asbestos Exposure, & GERD.  HPI Dyspnea: She reports she still has intermittent dyspnea that fluctuates on a daily basis. She admits that her dyspnea is worse with exertion. She does have intermittent coughing that is nonproductive mostly. She reports she does produce some clear mucus at times. She reports she does have wheezing with significant exertion. She reports she has used her rescue inhaler and it seems to help her breathing but it does also make her cough at times.   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 No suggestion of interstitial lung disease on CT imaging.  H/O Asbestos Exposure: No pleural thickening or suggestion of interstitial on CT imaging.  GERD: Started on Zantac at last appointment. She admits she forgot about it. She was seen by GI and started on Omeprazole. She reports her reflux has improved somewhat but she still has it intermittently.    Review of Systems No chest pain, pressure or tightness. No ever, chills, or sweats. No headaches or near syncope.  Allergies  Allergen Reactions  . Codeine Nausea And Vomiting  . Penicillins Rash    Current Outpatient Prescriptions on File Prior to Visit  Medication Sig Dispense Refill  . albuterol (VENTOLIN HFA) 108 (90 Base) MCG/ACT inhaler Inhale 2 puffs into the lungs every 6 (six) hours as needed for wheezing or shortness of breath.    Marland Kitchen omeprazole (PRILOSEC) 20 MG capsule Take 1 capsule (20 mg total) by mouth daily before breakfast. 90 capsule 3  . ranitidine (ZANTAC) 150 MG tablet Take 1 tablet (150 mg total) by mouth at bedtime. (Patient not taking: Reported on 02/19/2016) 30 tablet 3   No current facility-administered medications on file prior to visit.     Past Medical History:  Diagnosis Date  . Arthritis   . Cough   . Migraine    . Migraine 2002  . Obesity   . Rectal bleeding     Past Surgical History:  Procedure Laterality Date  . bilateral BIOPSY BREAST  2002   benign  . Bilateral tubal ligation  2001  . CHOLECYSTECTOMY  2003  . COLONOSCOPY N/A 06/21/2015   SLF: hyperplastic polyps removed. next TCS 10 years  . EYE SURGERY     right cataract removal   . TOTAL ABDOMINAL HYSTERECTOMY W/ BILATERAL SALPINGOOPHORECTOMY  2003  . TUBAL LIGATION      Family History  Problem Relation Age of Onset  . Emphysema Paternal Grandfather   . Emphysema Maternal Grandmother   . Lung cancer Maternal Grandmother   . Asthma Mother   . Diabetes Mother   . Arthritis Mother   . Heart disease Mother   . Hypertension Father   . Breast cancer Sister   . Stroke Sister   . Diabetes Brother   . Heart disease Brother   . Colon cancer Paternal Uncle     15s  . Liver disease Neg Hx     Social History   Social History  . Marital status: Single    Spouse name: N/A  . Number of children: 1  . Years of education: N/A   Occupational History  . Social Services    Social History Main Topics  . Smoking status: Passive Smoke Exposure - Never Smoker  . Smokeless tobacco: Never Used     Comment: Father &  close friend  . Alcohol use No  . Drug use: No  . Sexual activity: Not Asked   Other Topics Concern  . None   Social History Narrative   Originally from [place]. 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 She reports that she wore a full body suit but questionable respiratory equipment for protection. No bird, mold, or hot tub exposure.       Objective:   Physical Exam BP 122/74 (BP Location: Left Wrist, Cuff Size: Large)   Pulse 61   Ht 5\' 7"  (1.702 m)   Wt (!) 328 lb (148.8 kg)   SpO2 100%   BMI 51.37 kg/m  General:  Awake. Alert. No distress. Morbidly obese. Integument:  Warm & dry. No rash on exposed skin.  Lymphatics:  No appreciated cervical or supraclavicular lymphadenoapthy. HEENT:   Moist mucus membranes. No oral ulcers. No scleral icterus. Mild bilateral nasal turbinate swelling persists. Cardiovascular:  Regular rate. No edema. Normal S1 & S2. Pulmonary:  Normal work of breathing on room air. Speaking in complete sentences. Clear on auscultation. Abdomen: Soft. Normal bowel sounds. Protuberant.   PFT 02/19/16: FVC 2.17 L (81%) FEV1 2.24 L (84%) FEV1/FVC 0.82 FEF 25-75 2.32 L (85%) positive bronchodilator response TLC 4.58 L (80%) RV 89% ERV 50% DLCO uncorrected 62% 04/19/15: FVC 2.41 L (71%) FEV1 1.98 L (73%) FEV1/FVC 0.82 FEF 25-75 2.04 L (74%) negative bronchodilator response TLC 3.79 L (67%) RV 90% DLCO uncorrected 56%  6MWT 02/19/16:  Walked 375 meters / Baseline Sat 98% on RA / Nadir Sat 98% on RA @ rest  IMAGING CT CHEST W/O 12/09/15 (personally reviewed by me): No parenchymal nodule or opacity appreciated. No pleural effusion or thickening. No pericardial effusion. No pathologic mediastinal adenopathy.  CXR PA/LAT 03/21/15 (previously reviewed by me): No parenchymal nodule or opacity. Low lung volumes. Fullness to bilateral hilum. Heart normal in size & mediastinum normal in contour. No pleural effusion.  LABS 12/03/15 CBC:  7.0/10.9/35.8/307    Assessment & Plan:  50 y.o. morbidly obese female presenting with progressively worsening dyspnea on exertion. Suspect the patient has some element of underlying reactive airways disease given the significant bronchodilator response on her spirometry today. Even so, she has no fixed airway obstruction. Additionally, her restriction has improved likely owing to the weight loss she has undergone. Her carbon monoxide diffusion capacity has improved as well. Her reflux seems to be reasonably well-controlled at this time but with ongoing symptoms could benefit from additional gastric acid suppression. I instructed the patient to contact my office if she had any new breathing problems or questions before her next  appointment.  1. Dyspnea: Suspect underlying reactive airways disease. Recommended using her albuterol inhaler prior to exercise. 2. 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 No further testing necessary. 3. H/O Asbestos Exposure: Continuing to monitor with yearly chest x-ray PA/LAT & full pulmonary function testing. 4. GERD: Advised patient to try using Zantac as prescribed. She will continue to use omeprazole. 5. Follow-up: Patient to return to clinic in 6 months or sooner if needed.  Sonia Baller Ashok Cordia, M.D. [facility] Pager:  [phone] After 3pm or if no response, call [phone] 4:38 PM 02/19/16

## 2016-02-19 NOTE — Progress Notes (Signed)
Test reviewed.  

## 2016-02-19 NOTE — Patient Instructions (Signed)
   Try using your albuterol inhaler before you walk or do exercise to see if this helps your cough and shortness of breath.  Try using the Zantac at night. Avoid stopping the Omeprazole abruptly as this can worsen your reflux.  Remember to get your Flu shot by the end of October.  I will see you back in 6 months but call if you have any new or worsening breathing problems because I will be happy to see you sooner.

## 2016-05-03 NOTE — Progress Notes (Signed)
Doris, can you find out if patient ever had those labs in 12/2015 or 01/2016, ?had with her work.

## 2016-05-04 NOTE — Progress Notes (Signed)
PT said she had her labs done at [facility] on 12/27/2015 the day she had her MRI done.

## 2016-06-16 ENCOUNTER — Ambulatory Visit: Payer: BLUE CROSS/BLUE SHIELD | Admitting: Family Medicine

## 2016-06-25 NOTE — Progress Notes (Signed)
All she had then was a creatinine.  She needs CBC, iron/tibc, ferritin, lft. Dx: anemia and fatty liver

## 2016-06-29 ENCOUNTER — Other Ambulatory Visit: Payer: Self-pay

## 2016-06-29 DIAGNOSIS — D649 Anemia, unspecified: Secondary | ICD-10-CM

## 2016-06-29 DIAGNOSIS — K760 Fatty (change of) liver, not elsewhere classified: Secondary | ICD-10-CM

## 2016-06-29 NOTE — Progress Notes (Signed)
LMOM for a return call. ( I have entered new orders for her to go to [facility]).

## 2016-06-29 NOTE — Progress Notes (Signed)
Pt is aware, but might call back for orders to have done at her work.

## 2016-06-30 NOTE — Progress Notes (Signed)
I left message for a return call if she has questions, and that I am mailing the lab orders to her for her to do wherever she chooses.

## 2016-08-12 ENCOUNTER — Other Ambulatory Visit: Payer: Self-pay

## 2016-08-12 ENCOUNTER — Telehealth: Payer: Self-pay

## 2016-08-12 DIAGNOSIS — R7989 Other specified abnormal findings of blood chemistry: Secondary | ICD-10-CM

## 2016-08-12 NOTE — Telephone Encounter (Signed)
Received labs dated March 2018, originally requested her to have labs back in July 2017, patient thought she had it done but the hospital only drew the creatinine for the MRI. Somewhat delayed as patient wanted to have done through her employer.  White blood cell count 8800, hemoglobin 11 (normal 11.1-15.9), hematocrit 37.5 normal, MCV 77 low, platelets 335,000, iron 47, TIBC 251, iron saturations 19%, ferritin 322 high.  Labs of H/H, MCV very stable dating back multiple years. She has no evidence of IDA.   Her ferritin is somewhat elevated, can be seen with inflammation or with hemochromatosis, etoh use as well. Iron and sat are normal. She does not drink etoh.    At this time, I would recommend ifobt. Would repeat ferritin in 2 months. Does she have known active infection right now?

## 2016-08-12 NOTE — Telephone Encounter (Signed)
Noted  

## 2016-08-12 NOTE — Telephone Encounter (Signed)
Labs are back from [facility] and they are in LSL box. 

## 2016-08-12 NOTE — Telephone Encounter (Signed)
Pt is aware. She will come by and pick up the iFOBT to complete and return to our office. Lab orders on file for 10/12/2016.   Pt said she had recently been treated for plantar faciitis, with a steroid shot and prednisone pack.  She is now taking Diclofenac 75 mg bid.

## 2016-08-18 ENCOUNTER — Ambulatory Visit (INDEPENDENT_AMBULATORY_CARE_PROVIDER_SITE_OTHER): Payer: BLUE CROSS/BLUE SHIELD

## 2016-08-18 DIAGNOSIS — R7989 Other specified abnormal findings of blood chemistry: Secondary | ICD-10-CM | POA: Diagnosis not present

## 2016-08-18 LAB — IFOBT (OCCULT BLOOD): IFOBT: NEGATIVE

## 2016-08-26 NOTE — Progress Notes (Signed)
Please let patient know ifobt negative. Ferritin in a couple of months as planned.

## 2016-08-27 IMAGING — DX DG NECK SOFT TISSUE
2 series · 2 of 2 positions shown · non-contrast
Comparison: None.

CLINICAL DATA: Sore throat, hoarseness for 2 days.

EXAM:
NECK SOFT TISSUES - 1+ VIEW

[neck lat]
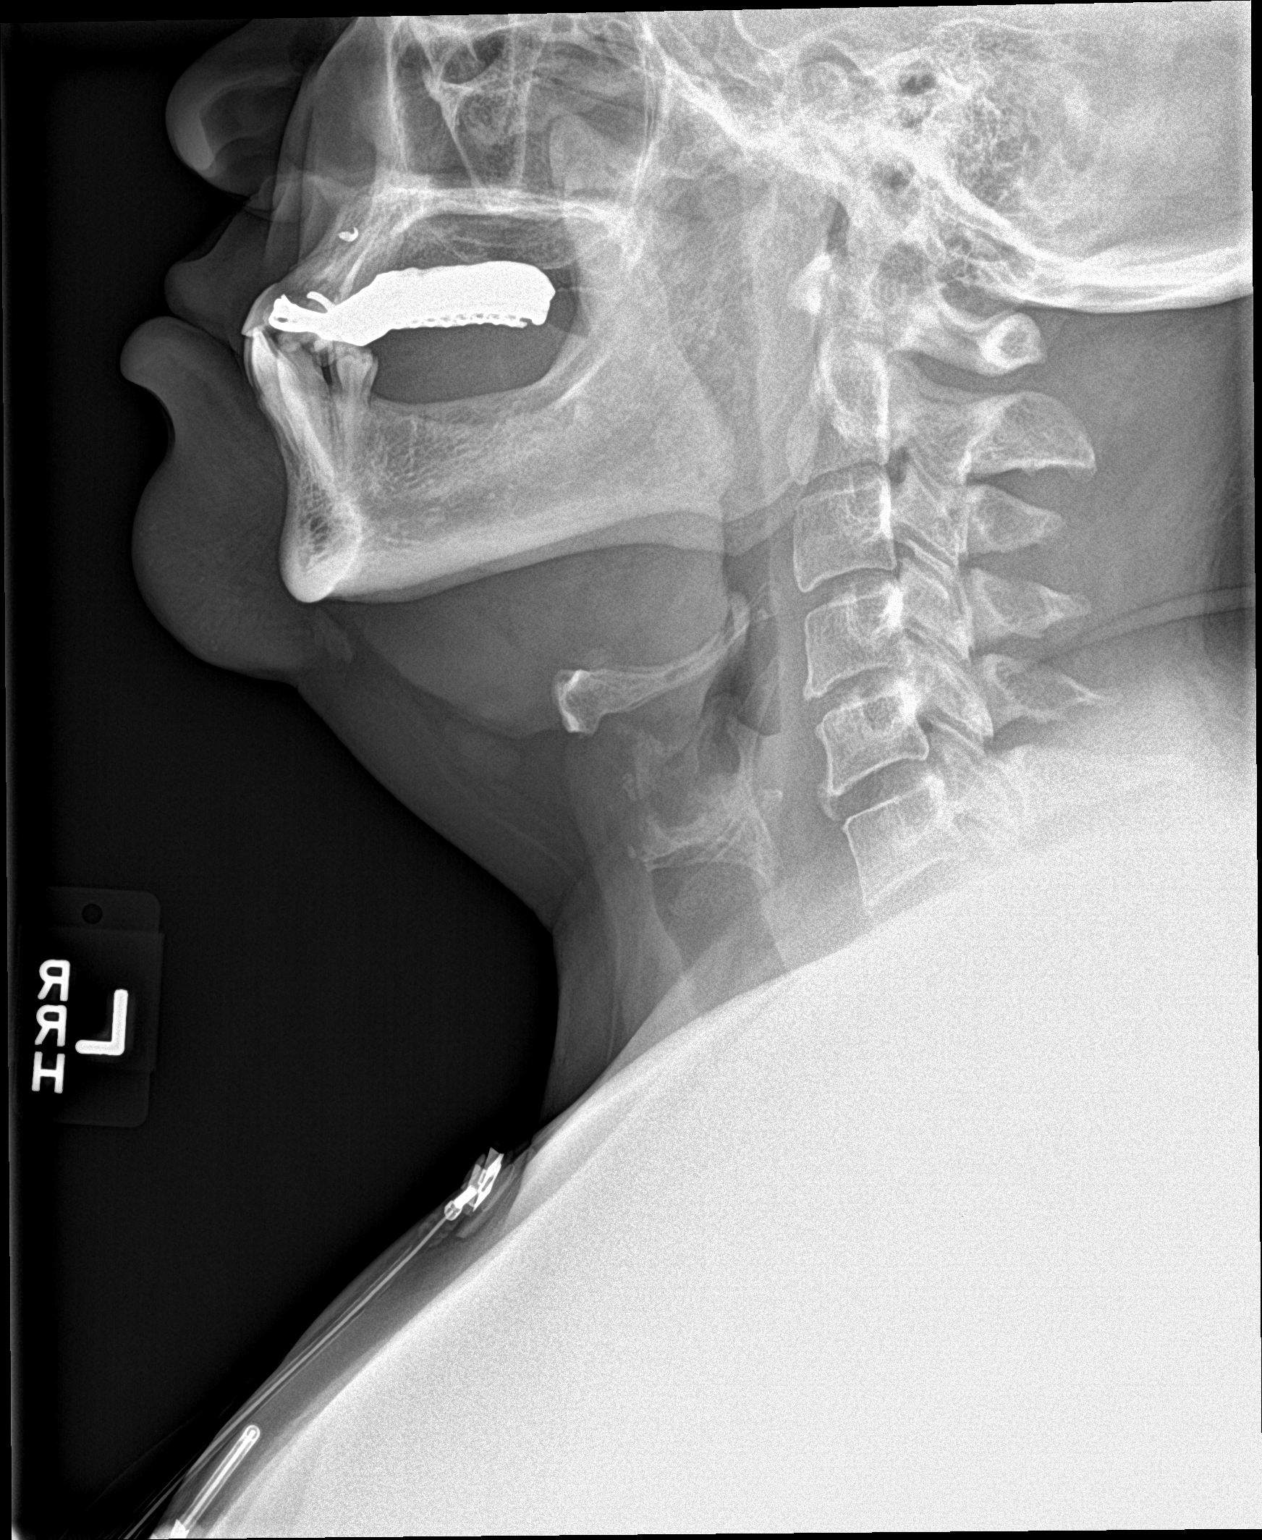

[neck ap]
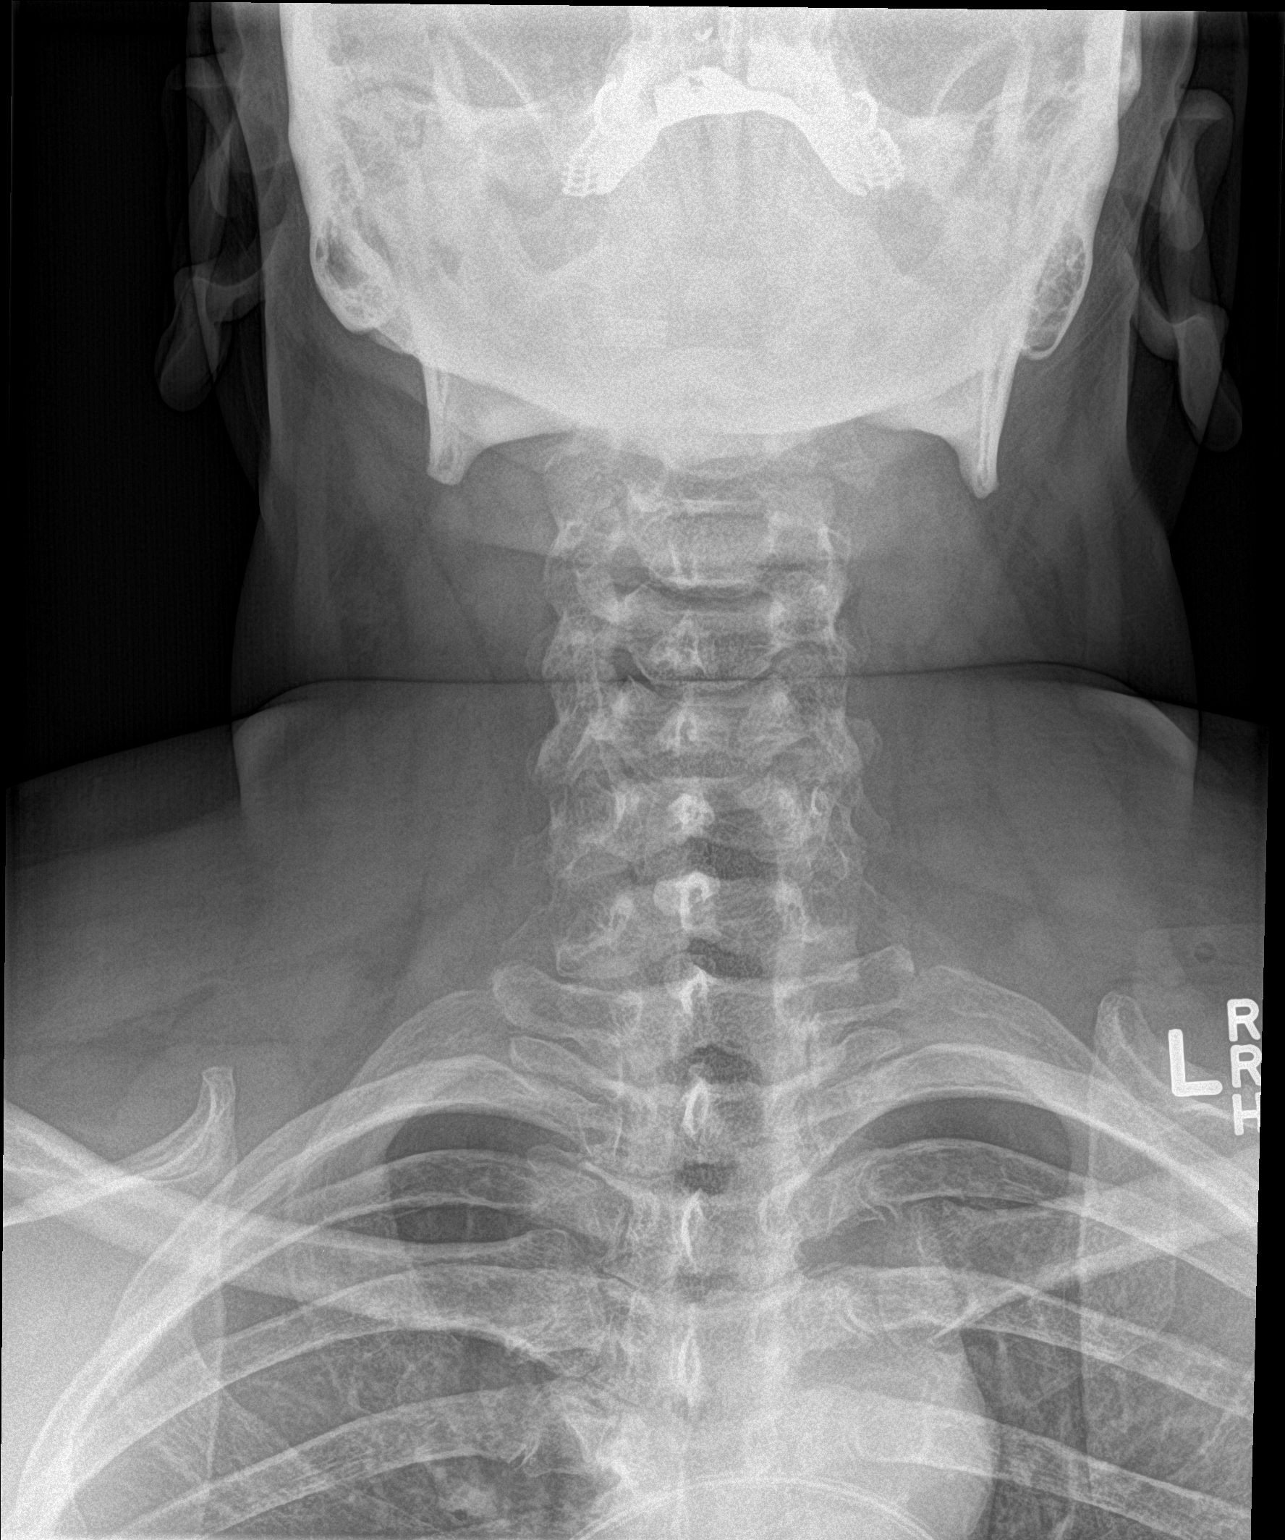

[2 of 2 positions shown; findings below may reference images not displayed]

FINDINGS: There is no evidence of retropharyngeal soft tissue swelling or
epiglottic enlargement. The cervical airway is unremarkable and no
radio-opaque foreign body identified.
IMPRESSION: Negative.

## 2016-08-27 NOTE — Progress Notes (Signed)
Pt is aware.  

## 2016-09-03 ENCOUNTER — Encounter: Payer: Self-pay | Admitting: Family Medicine

## 2016-09-03 ENCOUNTER — Other Ambulatory Visit: Payer: Self-pay

## 2016-09-03 ENCOUNTER — Ambulatory Visit (INDEPENDENT_AMBULATORY_CARE_PROVIDER_SITE_OTHER): Payer: BLUE CROSS/BLUE SHIELD | Admitting: Family Medicine

## 2016-09-03 VITALS — BP 122/80 | HR 60 | Resp 16 | Ht 67.0 in | Wt 321.0 lb

## 2016-09-03 DIAGNOSIS — R519 Headache, unspecified: Secondary | ICD-10-CM | POA: Insufficient documentation

## 2016-09-03 DIAGNOSIS — R7989 Other specified abnormal findings of blood chemistry: Secondary | ICD-10-CM

## 2016-09-03 DIAGNOSIS — M25561 Pain in right knee: Secondary | ICD-10-CM | POA: Diagnosis not present

## 2016-09-03 DIAGNOSIS — G44201 Tension-type headache, unspecified, intractable: Secondary | ICD-10-CM | POA: Diagnosis not present

## 2016-09-03 DIAGNOSIS — G43819 Other migraine, intractable, without status migrainosus: Secondary | ICD-10-CM

## 2016-09-03 DIAGNOSIS — E8881 Metabolic syndrome: Secondary | ICD-10-CM | POA: Diagnosis not present

## 2016-09-03 DIAGNOSIS — M62838 Other muscle spasm: Secondary | ICD-10-CM

## 2016-09-03 DIAGNOSIS — R51 Headache: Secondary | ICD-10-CM

## 2016-09-03 MED ORDER — DIAZEPAM 5 MG PO TABS: ORAL_TABLET | ORAL | 0 refills | Status: DC

## 2016-09-03 MED ORDER — METHYLPREDNISOLONE ACETATE 80 MG/ML IJ SUSP
80.0000 mg | Freq: Once | INTRAMUSCULAR | Status: AC
  Administered 2016-09-03: 80 mg via INTRAMUSCULAR

## 2016-09-03 MED ORDER — RANITIDINE HCL 300 MG PO TABS: 300.0000 mg | ORAL_TABLET | Freq: Every day | ORAL | 0 refills | Status: DC

## 2016-09-03 MED ORDER — PREDNISONE 5 MG (21) PO TBPK: 5.0000 mg | ORAL_TABLET | ORAL | 0 refills | Status: DC

## 2016-09-03 MED ORDER — KETOROLAC TROMETHAMINE 60 MG/2ML IM SOLN
60.0000 mg | Freq: Once | INTRAMUSCULAR | Status: AC
  Administered 2016-09-03: 60 mg via INTRAMUSCULAR

## 2016-09-03 MED ORDER — IBUPROFEN 800 MG PO TABS
800.0000 mg | ORAL_TABLET | Freq: Three times a day (TID) | ORAL | 1 refills | Status: DC | PRN
Start: 2016-09-03 — End: 2018-05-03

## 2016-09-03 NOTE — Patient Instructions (Signed)
Annual physical exam early August, call if you need me sooner  You are being treated for headache and neck spasm also acute right knee pain in arthritic knee Do nOT take diclofenac when you are taking ibuprofen prescribed Ibuprofen and prednisone are prescribed, and a muscle relaxant, also zantac to co/ protect  your stomach on anti inflammatory medication  Fasting labs 1 week before next visit please  Thanks for choosing [facility], we consider it a privelige to serve you.  Please work on good  health habits so that your health will improve. 1. Commitment to daily physical activity for 30 to 60  minutes, if you are able to do this.  2. Commitment to wise food choices. Aim for half of your  food intake to be vegetable and fruit, one quarter starchy foods, and one quarter protein. Try to eat on a regular schedule  3 meals per day, snacking between meals should be limited to vegetables or fruits or small portions of nuts. 64 ounces of water per day is generally recommended, unless you have specific health conditions, like heart failure or kidney failure where you will need to limit fluid intake.  3. Commitment to sufficient and a  good quality of physical and mental rest daily, generally between 6 to 8 hours per day.  WITH PERSISTANCE AND PERSEVERANCE, THE IMPOSSIBLE , BECOMES THE NORM!

## 2016-09-03 NOTE — Assessment & Plan Note (Signed)
Uncontrolled.Toradol and depo medrol administered IM in the office , to be followed by a short course of oral prednisone and NSAIDS.  

## 2016-09-03 NOTE — Progress Notes (Signed)
   Shannon Roth     MRN: [id number]      DOB: 18-Jun-1965   HPI Ms. Bilyk c/o 3 day h/o left sided posterior  Headache triggered by left neck spasm when she acutely turned her neck 3 days ago , rated a 10 Right knee swelling and tenderness rated at an 8 , no recent direct knee trauma, notes , when pointed out , that her knee is deformed and swollen, denies buckling ROS Denies recent fever or chills. Denies sinus pressure, nasal congestion, ear pain or sore throat. Denies chest congestion, productive cough or wheezing. Denies chest pains, palpitations and leg swelling Denies abdominal pain, nausea, vomiting,diarrhea or constipation.   Denies dysuria, frequency, hesitancy or incontinence.  Denies depression, anxiety or insomnia. Denies skin break down or rash.   PE  BP 122/80   Pulse 60   Resp 16   Ht 5\' 7"  (1.702 m)   Wt (!) 321 lb (145.6 kg)   SpO2 100%   BMI 50.28 kg/m   Patient alert and oriented and in no cardiopulmonary distress. Pt in pain HEENT: No facial asymmetry, EOMI,   oropharynx pink and moist.  Neck decreased ROM with left trapezius spasm,no JVD, no mass.  Chest: Clear to auscultation bilaterally.  CVS: S1, S2 no murmurs, no S3.Regular rate.  ABD: Soft non tender.   Ext: No edema  MS: Adequate ROM spine, shoulders, hips and knees.  Skin: Intact, no ulcerations or rash noted.  Psych: Good eye contact, normal affect. Memory intact not anxious or depressed appearing.  CNS: CN 2-12 intact, power,  normal throughout.no focal deficits noted.   Assessment & Plan  Migraine headache Uncontrolled.Toradol and depo medrol administered IM in the office , to be followed by a short course of oral prednisone and NSAIDS.   Headache Severe acute onset headache with left neck spasm,  Uncontrolled.Toradol and depo medrol administered IM in the office , to be followed by a short course of oral prednisone and NSAIDS.   Knee pain, right Acute pain and swelling  parenteral and oral anti inflammatories. Weight ;loss and quad strengthening exercises  Neck muscle spasm Short limited course of valium prescribed  Morbid obesity Improved Patient re-educated about  the importance of commitment to a  minimum of 150 minutes of exercise per week.  The importance of healthy food choices with portion control discussed. Encouraged to start a food diary, count calories and to consider  joining a support group. Sample diet sheets offered. Goals set by the patient for the next several months.   Weight /BMI 09/03/2016 02/19/2016 12/24/2015  WEIGHT 321 lb 328 lb 323 lb 3.2 oz  HEIGHT 5\' 7"  5\' 7"  5\' 8"   BMI 50.28 kg/m2 51.37 kg/m2 49.15 kg/m2

## 2016-09-05 DIAGNOSIS — M62838 Other muscle spasm: Secondary | ICD-10-CM | POA: Insufficient documentation

## 2016-09-05 NOTE — Assessment & Plan Note (Addendum)
Severe acute onset headache with left neck spasm,  Uncontrolled.Toradol and depo medrol administered IM in the office , to be followed by a short course of oral prednisone and NSAIDS.

## 2016-09-05 NOTE — Assessment & Plan Note (Signed)
Short limited course of valium prescribed

## 2016-09-05 NOTE — Assessment & Plan Note (Signed)
Acute pain and swelling parenteral and oral anti inflammatories. Weight ;loss and quad strengthening exercises

## 2016-09-05 NOTE — Assessment & Plan Note (Addendum)
Improved Patient re-educated about  the importance of commitment to a  minimum of 150 minutes of exercise per week.  The importance of healthy food choices with portion control discussed. Encouraged to start a food diary, count calories and to consider  joining a support group. Sample diet sheets offered. Goals set by the patient for the next several months.   Weight /BMI 09/03/2016 02/19/2016 12/24/2015  WEIGHT 321 lb 328 lb 323 lb 3.2 oz  HEIGHT 5\' 7"  5\' 7"  5\' 8"   BMI 50.28 kg/m2 51.37 kg/m2 49.15 kg/m2

## 2016-11-24 DIAGNOSIS — M722 Plantar fascial fibromatosis: Secondary | ICD-10-CM | POA: Insufficient documentation

## 2017-01-25 ENCOUNTER — Encounter: Payer: BLUE CROSS/BLUE SHIELD | Admitting: Family Medicine

## 2017-02-10 ENCOUNTER — Ambulatory Visit (INDEPENDENT_AMBULATORY_CARE_PROVIDER_SITE_OTHER): Payer: Commercial Managed Care - PPO | Admitting: Family Medicine

## 2017-02-10 ENCOUNTER — Telehealth: Payer: Self-pay | Admitting: Family Medicine

## 2017-02-10 ENCOUNTER — Encounter: Payer: Self-pay | Admitting: Family Medicine

## 2017-02-10 VITALS — BP 132/80 | HR 68 | Temp 97.5°F | Resp 24 | Ht 67.0 in | Wt 325.0 lb

## 2017-02-10 DIAGNOSIS — J4521 Mild intermittent asthma with (acute) exacerbation: Secondary | ICD-10-CM

## 2017-02-10 MED ORDER — HYDROCODONE-HOMATROPINE 5-1.5 MG/5ML PO SYRP: 5.0000 mL | ORAL_SOLUTION | Freq: Four times a day (QID) | ORAL | 0 refills | Status: DC | PRN

## 2017-02-10 MED ORDER — PREDNISONE 20 MG PO TABS: 20.0000 mg | ORAL_TABLET | Freq: Two times a day (BID) | ORAL | 0 refills | Status: DC

## 2017-02-10 NOTE — Progress Notes (Signed)
Chief Complaint  Patient presents with  . Cough    since friday  . Shortness of Breath   History of asthma She has dyspnea, wheezing, severe barking cough, white to yellow sputum, fever and chills.  She feels tired.  Cough keeps her awake. She also has runny nose and sinus congestion, headache.  She is worse over several days.  Tried to work today - sent home.    Patient Active Problem List   Diagnosis Date Noted  . Neck muscle spasm 09/05/2016  . Headache 09/03/2016  . Knee pain, right 09/03/2016  . Abnormal CT of liver 12/24/2015  . Microcytic anemia 12/24/2015  . Breast nodule 12/19/2015  . Liver lesion, right lobe 12/19/2015  . H/O asbestos exposure 12/03/2015  . GERD (gastroesophageal reflux disease) 12/03/2015  . Dyspnea 12/03/2015  . 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 Anxiety 02/17/2012  . Metabolic syndrome 31/49/7026  . BACK PAIN 11/29/2009  . Morbid obesity ([place]) 09/13/2007  . Migraine headache 09/13/2007  . RECTAL BLEEDING, HX OF 09/13/2007    Outpatient Encounter Prescriptions as of 02/10/2017  Medication Sig  . albuterol (VENTOLIN HFA) 108 (90 Base) MCG/ACT inhaler Inhale 2 puffs into the lungs every 6 (six) hours as needed for wheezing or shortness of breath.  . diclofenac (VOLTAREN) 75 MG EC tablet Take 75 mg by mouth 2 (two) times daily.  Marland Kitchen ibuprofen (ADVIL,MOTRIN) 800 MG tablet Take 1 tablet (800 mg total) by mouth every 8 (eight) hours as needed.  . ranitidine (ZANTAC) 300 MG tablet Take 1 tablet (300 mg total) by mouth at bedtime.  . [DISCONTINUED] diazepam (VALIUM) 5 MG tablet One tablet at bedtime, as needed, for left neck spasm  . HYDROcodone-homatropine (HYCODAN) 5-1.5 MG/5ML syrup Take 5 mLs by mouth every 6 (six) hours as needed for cough.  . predniSONE (DELTASONE) 20 MG tablet Take 1 tablet (20 mg total) by mouth 2 (two) times daily with a meal.  . [DISCONTINUED] predniSONE (STERAPRED UNI-PAK 21 TAB) 5 MG (21) TBPK  tablet Take 1 tablet (5 mg total) by mouth as directed. Use as directed (Patient not taking: Reported on 02/10/2017)   No facility-administered encounter medications on file as of 02/10/2017.     Allergies  Allergen Reactions  . Codeine Nausea And Vomiting  . Penicillins Rash    Review of Systems  Constitutional: Positive for chills, fatigue and fever.  HENT: Positive for congestion, postnasal drip, rhinorrhea, sinus pain, sinus pressure and voice change. Negative for sore throat.   Eyes: Negative for redness and visual disturbance.  Respiratory: Positive for cough, chest tightness, shortness of breath and wheezing.   Cardiovascular: Negative for chest pain, palpitations and leg swelling.  Gastrointestinal: Negative for diarrhea, nausea and vomiting.  Genitourinary: Negative for difficulty urinating, dysuria and flank pain.  Musculoskeletal: Negative for arthralgias and back pain.  Neurological: Positive for weakness, light-headedness and headaches.  Psychiatric/Behavioral: Positive for sleep disturbance.  All other systems reviewed and are negative.   BP 132/80 (BP Location: Left Arm, Patient Position: Sitting, Cuff Size: Large)   Pulse 68   Temp (!) 97.5 F (36.4 C) (Temporal)   Resp (!) 24   Ht 5\' 7"  (1.702 m)   Wt (!) 325 lb (147.4 kg)   SpO2 100%   BMI 50.90 kg/m   Physical Exam  Constitutional: She is oriented to person, place, and time. She appears well-developed and well-nourished. She appears distressed.  Fatigued.  Hoarse voice.  Dyspneic.  Obese.  HENT:  Head: Normocephalic and atraumatic.  Right Ear: External ear normal.  Left Ear: External ear normal.  Nose: Nose normal.  Mouth/Throat: Oropharynx is clear and moist.  No sinus tenderness  Eyes: Pupils are equal, round, and reactive to light. Conjunctivae are normal.  Neck: Normal range of motion.  Cardiovascular: Normal rate, regular rhythm and normal heart sounds.   Pulmonary/Chest: No respiratory distress.  She has wheezes. She has no rales.  Dyspnea, decreased breath sounds.  Scattered wheeze.  improved after neb Tx.  Abdominal: Soft. Bowel sounds are normal.  Musculoskeletal: Normal range of motion.  No clubbing, cyanosis, or edema  Lymphadenopathy:    She has no cervical adenopathy.  Neurological: She is alert and oriented to person, place, and time.  Psychiatric: She has a normal mood and affect. Her behavior is normal.    ASSESSMENT/PLAN:  1. Asthma in adult, mild intermittent, with acute exacerbation Discussed viral bronchitis / Illness triggering asthma.  No need for antibiotics.  She does need prednisone and albuterol.  Can use strong cough med for night.  Off work for 2 days.   Patient Instructions  Rest Push fluids Use inhaler as needed wheezing Cough syrup as needed - may cause drowsiness Take prednisone 2 times a day Take 2 doses today Call if not improving by Friday    Raylene Everts, MD

## 2017-02-10 NOTE — Telephone Encounter (Signed)
4 day h/oh/o sore throat and cough and congestion   [phone] currently at work

## 2017-02-10 NOTE — Patient Instructions (Signed)
Rest Push fluids Use inhaler as needed wheezing Cough syrup as needed - may cause drowsiness Take prednisone 2 times a day Take 2 doses today Call if not improving by Friday

## 2017-02-12 ENCOUNTER — Telehealth: Payer: Self-pay | Admitting: Family Medicine

## 2017-02-12 ENCOUNTER — Telehealth: Payer: Self-pay

## 2017-02-12 NOTE — Telephone Encounter (Signed)
Note written and faxed 

## 2017-02-12 NOTE — Telephone Encounter (Signed)
She may be out until Monday

## 2017-02-12 NOTE — Telephone Encounter (Signed)
Patient is requesting an extension on her work note for today  Fax: [phone]  Attention: Warrick Parisian.

## 2017-02-12 NOTE — Telephone Encounter (Signed)
pls direct this to Dr Meda Coffee sionce she evaluated the pt in the office

## 2017-02-12 NOTE — Telephone Encounter (Signed)
Done, faxed

## 2017-03-22 ENCOUNTER — Telehealth: Payer: Self-pay | Admitting: *Deleted

## 2017-03-22 NOTE — Telephone Encounter (Signed)
[facility] labs placed on [facility] desk

## 2017-03-29 NOTE — Telephone Encounter (Signed)
Reviewed labs.  Ferritin still elevated at 335.  History of enlarged, fatty liver.  She needs follow-up office visit with [place].  At minimal repeat LFTs, rule out hemochromatosis.

## 2017-04-08 ENCOUNTER — Encounter: Payer: Self-pay | Admitting: Gastroenterology

## 2017-04-08 NOTE — Telephone Encounter (Signed)
APPT MADE

## 2017-04-08 NOTE — Telephone Encounter (Signed)
Pt is aware and OK to schedule OV.

## 2017-05-05 ENCOUNTER — Encounter: Payer: Self-pay | Admitting: Gastroenterology

## 2017-05-05 ENCOUNTER — Ambulatory Visit (INDEPENDENT_AMBULATORY_CARE_PROVIDER_SITE_OTHER): Payer: Commercial Managed Care - PPO | Admitting: Gastroenterology

## 2017-05-05 VITALS — BP 155/84 | HR 56 | Temp 98.1°F | Ht 68.0 in | Wt 331.8 lb

## 2017-05-05 DIAGNOSIS — R7989 Other specified abnormal findings of blood chemistry: Secondary | ICD-10-CM | POA: Insufficient documentation

## 2017-05-05 DIAGNOSIS — K760 Fatty (change of) liver, not elsewhere classified: Secondary | ICD-10-CM

## 2017-05-05 NOTE — Progress Notes (Addendum)
   REVIEWED-NO ADDITIONAL RECOMMENDATIONS.   Primary Care Physician: Fayrene Helper, MD  Primary Gastroenterologist:  Barney Drain, MD   Chief Complaint  Patient presents with  . fatty liver    HPI: Shannon Roth is a 50 y.o. female here for follow-up of fatty liver.  Seen last year for abnormal CT scan showing low-attenuation lesion in the right hepatic lobe which was indeterminate, measuring 5 cm.  MRI liver showed 2 hepatic hemangiomas, one corresponding to the large lesion seen on CT.  She also had somewhat enlarged and fatty liver.  History of elevated ferritin twice.  Clinically she is been having issues with joint pain and is seeing several specialists.  Initially diagnosed with plantar fasciitis the patient states mostly just arthritis at this point.  A GI standpoint she is doing well.  Bowel movements are regular.  No blood in the stool or melena.  No abdominal pain.  Appetite is good.  No upper GI symptoms.  She alternates diclofenac and Advil the pain which when she has at the time.    Current Outpatient Medications  Medication Sig Dispense Refill  . albuterol (VENTOLIN HFA) 108 (90 Base) MCG/ACT inhaler Inhale 2 puffs into the lungs every 6 (six) hours as needed for wheezing or shortness of breath.    . diclofenac (VOLTAREN) 75 MG EC tablet Take 75 mg by mouth 2 (two) times daily.    Marland Kitchen HYDROcodone-homatropine (HYCODAN) 5-1.5 MG/5ML syrup Take 5 mLs by mouth every 6 (six) hours as needed for cough. 180 mL 0  . ibuprofen (ADVIL,MOTRIN) 800 MG tablet Take 1 tablet (800 mg total) by mouth every 8 (eight) hours as needed. 30 tablet 1   No current facility-administered medications for this visit.     Allergies as of 05/05/2017 - Review Complete 05/05/2017  Allergen Reaction Noted  . Codeine Nausea And Vomiting 05/10/2008  . Penicillins Rash 09/12/2007    ROS:  General: Negative for anorexia, weight loss, fever, chills, fatigue, weakness. ENT: Negative for  hoarseness, difficulty swallowing , nasal congestion. CV: Negative for chest pain, angina, palpitations, dyspnea on exertion, peripheral edema.  Respiratory: Negative for dyspnea at rest, dyspnea on exertion, cough, sputum, wheezing.  GI: See history of present illness. GU:  Negative for dysuria, hematuria, urinary incontinence, urinary frequency, nocturnal urination.  Endo: Negative for unusual weight change.    Physical Examination:   BP (!) 155/84   Pulse (!) 56   Temp 98.1 F (36.7 C) (Oral)   Ht 5\' 8"  (1.727 m)   Wt (!) 331 lb 12.8 oz (150.5 kg)   BMI 50.45 kg/m   General: Well-nourished, well-developed in no acute distress.  Eyes: No icterus. Mouth: Oropharyngeal mucosa moist and pink , no lesions erythema or exudate. Lungs: Clear to auscultation bilaterally.  Heart: Regular rate and rhythm, no murmurs rubs or gallops.  Abdomen: Bowel sounds are normal, nontender, nondistended, no hepatosplenomegaly or masses, no abdominal bruits or hernia , no rebound or guarding.   Extremities: No lower extremity edema. No clubbing or deformities. Neuro: Alert and oriented x 4   Skin: Warm and dry, no jaundice.   Psych: Alert and cooperative, normal mood and affect.  Imaging Studies: No results found.

## 2017-05-05 NOTE — Patient Instructions (Signed)
1. Please have your labs done. If you have not heard from our office within 7-10 business days please call our office to make sure we received your results.    Fatty Liver Fatty liver, also called hepatic steatosis or steatohepatitis, is a condition in which too much fat has built up in your liver cells. The liver removes harmful substances from your bloodstream. It produces fluids your body needs. It also helps your body use and store energy from the food you eat. In many cases, fatty liver does not cause symptoms or problems. It is often diagnosed when tests are being done for other reasons. However, over time, fatty liver can cause inflammation that may lead to more serious liver problems, such as scarring of the liver (cirrhosis). What are the causes? Causes of fatty liver may include:  Drinking too much alcohol.  Poor nutrition.  Obesity.  Cushing syndrome.  Diabetes.  Hyperlipidemia.  Pregnancy.  Certain drugs.  Poisons.  Some viral infections.  What increases the risk? You may be more likely to develop fatty liver if you:  Abuse alcohol.  Are pregnant.  Are overweight.  Have diabetes.  Have hepatitis.  Have a high triglyceride level.  What are the signs or symptoms? Fatty liver often does not cause any symptoms. In cases where symptoms develop, they can include:  Fatigue.  Weakness.  Weight loss.  Confusion.  Abdominal pain.  Yellowing of your skin and the white parts of your eyes (jaundice).  Nausea and vomiting.  How is this diagnosed? Fatty liver may be diagnosed by:  Physical exam and medical history.  Blood tests.  Imaging tests, such as an ultrasound, CT scan, or MRI.  Liver biopsy. A small sample of liver tissue is removed using a needle. The sample is then looked at under a microscope.  How is this treated? Fatty liver is often caused by other health conditions. Treatment for fatty liver may involve medicines and lifestyle  changes to manage conditions such as:  Alcoholism.  High cholesterol.  Diabetes.  Being overweight or obese.  Follow these instructions at home:  Eat a healthy diet as directed by your health care provider.  Exercise regularly. This can help you lose weight and control your cholesterol and diabetes. Talk to your health care provider about an exercise plan and which activities are best for you.  Do not drink alcohol.  Take medicines only as directed by your health care provider. Contact a health care provider if: You have difficulty controlling your:  Blood sugar.  Cholesterol.  Alcohol consumption.  Get help right away if:  You have abdominal pain.  You have jaundice.  You have nausea and vomiting. This information is not intended to replace advice given to you by your health care provider. Make sure you discuss any questions you have with your health care provider. Document Released: 07/10/2005 Document Revised: 10/31/2015 Document Reviewed: 10/04/2013 Elsevier Interactive Patient Education  [employer].

## 2017-05-06 NOTE — Assessment & Plan Note (Signed)
51 year-old morbidly obese female with history of fatty liver on imaging and elevated ferritin.  Plan to recheck labs including LFTs, iron, ferritin, hemochromatosis DNA.  Also with history of chronic anemia so follow-up with CBC at this time.  Instructions for fatty liver: Recommend 1-2# weight loss per week until ideal body weight through exercise & diet. Low fat/cholesterol diet.   Avoid sweets, sodas, fruit juices, sweetened beverages like tea, etc. Gradually increase exercise from 15 min daily up to 1 hr per day 5 days/week. Limit alcohol use.

## 2017-05-06 NOTE — Progress Notes (Signed)
cc'd to pcp 

## 2017-08-13 ENCOUNTER — Encounter: Payer: Self-pay | Admitting: Family Medicine

## 2017-08-13 ENCOUNTER — Ambulatory Visit (INDEPENDENT_AMBULATORY_CARE_PROVIDER_SITE_OTHER): Payer: Commercial Managed Care - PPO | Admitting: Family Medicine

## 2017-08-13 VITALS — BP 122/80 | HR 104 | Temp 97.5°F | Ht 68.0 in | Wt 317.5 lb

## 2017-08-13 DIAGNOSIS — J069 Acute upper respiratory infection, unspecified: Secondary | ICD-10-CM

## 2017-08-13 DIAGNOSIS — J4521 Mild intermittent asthma with (acute) exacerbation: Secondary | ICD-10-CM

## 2017-08-13 LAB — POCT INFLUENZA A/B
Influenza A, POC: NEGATIVE
Influenza B, POC: NEGATIVE

## 2017-08-13 LAB — POCT RAPID STREP A (OFFICE): Rapid Strep A Screen: NEGATIVE

## 2017-08-13 MED ORDER — BENZONATATE 200 MG PO CAPS: 200.0000 mg | ORAL_CAPSULE | Freq: Two times a day (BID) | ORAL | 0 refills | Status: DC | PRN

## 2017-08-13 MED ORDER — PREDNISONE 20 MG PO TABS: 20.0000 mg | ORAL_TABLET | Freq: Two times a day (BID) | ORAL | 0 refills | Status: DC

## 2017-08-13 MED ORDER — ONDANSETRON HCL 8 MG PO TABS: 8.0000 mg | ORAL_TABLET | Freq: Three times a day (TID) | ORAL | 0 refills | Status: DC | PRN

## 2017-08-13 NOTE — Patient Instructions (Signed)
You need to rest at home. Take the medicine for nausea and vomiting Allowed it to work for 20-30 minutes After this try some sips of fluid.  If they stay down then you can take medication. Take prednisone twice a day for 5 days Try to take 2 doses today Take Tessalon 2-3 times a day for cough  Work note, stay off until Monday If not improved by Monday come back and see me

## 2017-08-13 NOTE — Progress Notes (Signed)
Chief Complaint  Patient presents with  . Acute Visit    cough, productive yellow mucus, sore throat, achy, denies fever    Patient is here for an acute illness.  Usual primary care physician is Shannon Nakayama MD She is having 2 days of sore throat, harsh cough, mild shortness of breath.  Her voice is very strained.  She feels wheezing mostly when she coughs.  She has had some achiness, but not severe.  No fever or chills.  She is producing scant yellow mucus. No sinus pressure pain, no postnasal drip noted no ear pressure or pain. She has had some nausea and vomiting. No diarrhea or abdominal pain.  Patient Active Problem List   Diagnosis Date Noted  . Fatty liver 05/05/2017  . Elevated ferritin 05/05/2017  . Neck muscle spasm 09/05/2016  . Headache 09/03/2016  . Knee pain, right 09/03/2016  . Abnormal CT of liver 12/24/2015  . Microcytic anemia 12/24/2015  . Breast nodule 12/19/2015  . Liver lesion, right lobe 12/19/2015  . H/O asbestos exposure 12/03/2015  . GERD (gastroesophageal reflux disease) 12/03/2015  . Dyspnea 12/03/2015  . 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 Anxiety 02/17/2012  . Metabolic syndrome 52/77/8242  . BACK PAIN 11/29/2009  . Morbid obesity ([place]) 09/13/2007  . Migraine headache 09/13/2007  . RECTAL BLEEDING, HX OF 09/13/2007    Outpatient Encounter Medications as of 08/13/2017  Medication Sig  . albuterol (VENTOLIN HFA) 108 (90 Base) MCG/ACT inhaler Inhale 2 puffs into the lungs every 6 (six) hours as needed for wheezing or shortness of breath.  . diclofenac (VOLTAREN) 75 MG EC tablet Take 75 mg by mouth 2 (two) times daily.  Marland Kitchen ibuprofen (ADVIL,MOTRIN) 800 MG tablet Take 1 tablet (800 mg total) by mouth every 8 (eight) hours as needed.  . benzonatate (TESSALON) 200 MG capsule Take 1 capsule (200 mg total) by mouth 2 (two) times daily as needed for cough.  Marland Kitchen HYDROcodone-homatropine (HYCODAN) 5-1.5 MG/5ML syrup Take 5  mLs by mouth every 6 (six) hours as needed for cough. (Patient not taking: Reported on 08/13/2017)  . ondansetron (ZOFRAN) 8 MG tablet Take 1 tablet (8 mg total) by mouth every 8 (eight) hours as needed for nausea or vomiting.  . predniSONE (DELTASONE) 20 MG tablet Take 1 tablet (20 mg total) by mouth 2 (two) times daily with a meal.   No facility-administered encounter medications on file as of 08/13/2017.     Allergies  Allergen Reactions  . Codeine Nausea And Vomiting  . Penicillins Rash    Review of Systems  Constitutional: Positive for fatigue. Negative for activity change, appetite change, chills, fever and unexpected weight change.  HENT: Positive for sore throat. Negative for congestion, dental problem, postnasal drip, rhinorrhea and voice change.   Eyes: Negative for redness and visual disturbance.  Respiratory: Positive for cough and wheezing. Negative for shortness of breath.   Cardiovascular: Negative for chest pain, palpitations and leg swelling.  Gastrointestinal: Positive for nausea and vomiting. Negative for abdominal pain, constipation and diarrhea.  Genitourinary: Negative for difficulty urinating and frequency.  Musculoskeletal: Negative for arthralgias and back pain.  Neurological: Positive for headaches. Negative for dizziness.  Psychiatric/Behavioral: Positive for sleep disturbance. Negative for dysphoric mood. The patient is not nervous/anxious.     BP 122/80 (BP Location: Right Arm, Patient Position: Sitting, Cuff Size: Large)   Pulse (!) 104   Temp (!) 97.5 F (36.4 C) (Temporal)  Ht 5\' 8"  (1.727 m)   Wt (!) 317 lb 8 oz (144 kg)   SpO2 100%   BMI 48.28 kg/m   Physical Exam  Constitutional: She is oriented to person, place, and time. She appears well-developed and well-nourished. She appears distressed.  Fatigued.  Whisper voice.  Dyspneic.  Obese.  HENT:  Head: Normocephalic and atraumatic.  Right Ear: External ear normal.  Left Ear: External ear  normal.  Nose: Nose normal.  Mouth/Throat: Oropharynx is clear and moist.  No sinus tenderness.  Tonsils and uvula mildly swollen and erythematous.  No exudate  Eyes: Conjunctivae are normal. Pupils are equal, round, and reactive to light.  Neck: Normal range of motion.  Cardiovascular: Normal rate, regular rhythm and normal heart sounds.  Pulmonary/Chest: No respiratory distress. She has wheezes. She has no rales.  Dyspnea, decreased breath sounds.  Scattered wheeze.,  Few, anterior  Abdominal: Soft. Bowel sounds are normal.  Musculoskeletal: Normal range of motion.  No clubbing, cyanosis, or edema  Lymphadenopathy:    She has cervical adenopathy.  Neurological: She is alert and oriented to person, place, and time.  Psychiatric: She has a normal mood and affect. Her behavior is normal.    ASSESSMENT/PLAN:  1. Asthma in adult, mild intermittent, with acute exacerbation Likely triggered by viral URI  2. Viral upper respiratory tract infection  - POCT rapid strep A= negative - POCT Influenza A/B= negative   Patient Instructions  You need to rest at home. Take the medicine for nausea and vomiting Allowed it to work for 20-30 minutes After this try some sips of fluid.  If they stay down then you can take medication. Take prednisone twice a day for 5 days Try to take 2 doses today Take Tessalon 2-3 times a day for cough  Work note, stay off until Monday If not improved by Monday come back and see me   Raylene Everts, MD

## 2017-09-02 ENCOUNTER — Telehealth: Payer: Self-pay | Admitting: Family Medicine

## 2017-09-02 NOTE — Telephone Encounter (Signed)
Patient called in to request an appt with Dr.Nelson because she didn't feel well. I offered her the available time slot we had for today but she could not make it.  She requested an appt with Dr.Nelson for next week I let her know that Dr.Nelson only see's Dr.Simpson's patients for acute same day appts and that if she needed an appt for next week we could look at Dr.Simpson's schedule.  She said that's ok I will call back next week. I told her if theres something open we need to take advantage because it doesn't last long, she said ok schedule me for a physical, I let her know physical availability is booked until June 2019 as of right now and she said ok I will call back

## 2017-11-09 ENCOUNTER — Other Ambulatory Visit: Payer: Self-pay | Admitting: Family Medicine

## 2017-12-03 ENCOUNTER — Telehealth: Payer: Self-pay

## 2017-12-03 NOTE — Telephone Encounter (Signed)
Pt called earlier and said she went to have her labs done at [facility]. She was advised that the provider and pt would need to sign some paperwork for the Hemochromatosis DNA. I called [facility] and spoke to [facility] and she gave me a number to call for hospital support team. I called [phone] and spoke to Helene Kelp about the test code [phone], and if we need to sign some form.  She checked and also asked in her department and I was told that has not been required in [place] yet. She said she would call Vaughan Basta and tell her how to override it. I have been unable to reach [place] after multiple attempts.  I have informed the pt of what I was told and told her I would leave her another Vm if anything different. I spoke to [place] and everything is OK. She apologized for all of this, but when pt came in it was before the customer service opened. I have not called and informed pt, the lab will be processed and she does not have to sign any paperwork.

## 2017-12-03 NOTE — Telephone Encounter (Signed)
Noted  

## 2017-12-07 LAB — CBC WITH DIFFERENTIAL/PLATELET
Basophils Absolute: 30 cells/uL (ref 0–200)
Basophils Relative: 0.4 %
Eosinophils Absolute: 81 cells/uL (ref 15–500)
Eosinophils Relative: 1.1 %
HCT: 35.2 % (ref 35.0–45.0)
Hemoglobin: 10.8 g/dL — ABNORMAL LOW (ref 11.7–15.5)
Lymphs Abs: 2938 cells/uL (ref 850–3900)
MCH: 22.9 pg — ABNORMAL LOW (ref 27.0–33.0)
MCHC: 30.7 g/dL — ABNORMAL LOW (ref 32.0–36.0)
MCV: 74.7 fL — ABNORMAL LOW (ref 80.0–100.0)
MPV: 11 fL (ref 7.5–12.5)
Monocytes Relative: 5.9 %
Neutro Abs: 3915 cells/uL (ref 1500–7800)
Neutrophils Relative %: 52.9 %
Platelets: 334 10*3/uL (ref 140–400)
RBC: 4.71 10*6/uL (ref 3.80–5.10)
RDW: 13.3 % (ref 11.0–15.0)
Total Lymphocyte: 39.7 %
WBC mixed population: 437 cells/uL (ref 200–950)
WBC: 7.4 10*3/uL (ref 3.8–10.8)

## 2017-12-07 LAB — IRON,TIBC AND FERRITIN PANEL
%SAT: 30 % (calc) (ref 16–45)
Ferritin: 293 ng/mL — ABNORMAL HIGH (ref 16–232)
Iron: 74 ug/dL (ref 45–160)
TIBC: 244 mcg/dL (calc) — ABNORMAL LOW (ref 250–450)

## 2017-12-07 LAB — HEPATIC FUNCTION PANEL
AG Ratio: 1.3 (calc) (ref 1.0–2.5)
ALT: 18 U/L (ref 6–29)
AST: 17 U/L (ref 10–35)
Albumin: 3.9 g/dL (ref 3.6–5.1)
Alkaline phosphatase (APISO): 88 U/L (ref 33–130)
Bilirubin, Direct: 0.1 mg/dL (ref 0.0–0.2)
Globulin: 3 g/dL (calc) (ref 1.9–3.7)
Indirect Bilirubin: 0.4 mg/dL (calc) (ref 0.2–1.2)
Total Bilirubin: 0.5 mg/dL (ref 0.2–1.2)
Total Protein: 6.9 g/dL (ref 6.1–8.1)

## 2017-12-07 LAB — HEMOCHROMATOSIS DNA-PCR(C282Y,H63D)

## 2017-12-08 ENCOUNTER — Encounter: Payer: Commercial Managed Care - PPO | Admitting: Family Medicine

## 2017-12-14 ENCOUNTER — Encounter: Payer: Self-pay | Admitting: Gastroenterology

## 2017-12-14 NOTE — Progress Notes (Signed)
Pt is aware. iFOBT at front for pt to pick up.  Forwarding to [place] to schedule the OV appt.

## 2017-12-14 NOTE — Progress Notes (Signed)
PATIENT SCHEDULED AND LETTER SENT  °

## 2018-01-03 ENCOUNTER — Ambulatory Visit (INDEPENDENT_AMBULATORY_CARE_PROVIDER_SITE_OTHER): Payer: Commercial Managed Care - PPO | Admitting: Gastroenterology

## 2018-01-03 DIAGNOSIS — D649 Anemia, unspecified: Secondary | ICD-10-CM

## 2018-01-04 ENCOUNTER — Encounter: Payer: Self-pay | Admitting: Gastroenterology

## 2018-01-04 LAB — IFOBT (OCCULT BLOOD): IFOBT: NEGATIVE

## 2018-01-05 ENCOUNTER — Ambulatory Visit (INDEPENDENT_AMBULATORY_CARE_PROVIDER_SITE_OTHER): Payer: Commercial Managed Care - PPO | Admitting: Family Medicine

## 2018-01-05 ENCOUNTER — Encounter: Payer: Self-pay | Admitting: Family Medicine

## 2018-01-05 ENCOUNTER — Other Ambulatory Visit (HOSPITAL_COMMUNITY)
Admission: RE | Admit: 2018-01-05 | Discharge: 2018-01-05 | Disposition: A | Discharge status: Home / Self Care | Payer: Commercial Managed Care - PPO | Source: Ambulatory Visit | Attending: Family Medicine | Admitting: Family Medicine

## 2018-01-05 ENCOUNTER — Other Ambulatory Visit: Payer: Self-pay

## 2018-01-05 VITALS — BP 148/70 | HR 72 | Resp 12 | Ht 68.0 in | Wt 313.1 lb

## 2018-01-05 DIAGNOSIS — Z0000 Encounter for general adult medical examination without abnormal findings: Secondary | ICD-10-CM | POA: Insufficient documentation

## 2018-01-05 DIAGNOSIS — J0100 Acute maxillary sinusitis, unspecified: Secondary | ICD-10-CM | POA: Diagnosis not present

## 2018-01-05 DIAGNOSIS — N760 Acute vaginitis: Secondary | ICD-10-CM | POA: Insufficient documentation

## 2018-01-05 DIAGNOSIS — J019 Acute sinusitis, unspecified: Secondary | ICD-10-CM | POA: Insufficient documentation

## 2018-01-05 DIAGNOSIS — I10 Essential (primary) hypertension: Secondary | ICD-10-CM | POA: Insufficient documentation

## 2018-01-05 DIAGNOSIS — Z23 Encounter for immunization: Secondary | ICD-10-CM | POA: Insufficient documentation

## 2018-01-05 DIAGNOSIS — Z1231 Encounter for screening mammogram for malignant neoplasm of breast: Secondary | ICD-10-CM

## 2018-01-05 DIAGNOSIS — J209 Acute bronchitis, unspecified: Secondary | ICD-10-CM | POA: Insufficient documentation

## 2018-01-05 MED ORDER — FLUCONAZOLE 150 MG PO TABS: ORAL_TABLET | ORAL | 0 refills | Status: DC

## 2018-01-05 MED ORDER — AZITHROMYCIN 250 MG PO TABS: ORAL_TABLET | ORAL | 0 refills | Status: DC

## 2018-01-05 MED ORDER — BENZONATATE 100 MG PO CAPS: 100.0000 mg | ORAL_CAPSULE | Freq: Two times a day (BID) | ORAL | 0 refills | Status: DC | PRN

## 2018-01-05 MED ORDER — PROMETHAZINE-DM 6.25-15 MG/5ML PO SYRP: ORAL_SOLUTION | ORAL | 0 refills | Status: DC

## 2018-01-05 NOTE — Assessment & Plan Note (Addendum)
Improved. Patient re-educated about  the importance of commitment to a  minimum of 150 minutes of exercise per week.  The importance of healthy food choices with portion control discussed. Encouraged to start a food diary, count calories and to consider  joining a support group. Sample diet sheets offered. Goals set by the patient for the next several months.   Weight /BMI 01/05/2018 08/13/2017 05/05/2017  WEIGHT 313 lb 1.3 oz 317 lb 8 oz 331 lb 12.8 oz  HEIGHT 5\' 8"  5\' 8"  5\' 8"   BMI 47.6 kg/m2 48.28 kg/m2 50.45 kg/m2

## 2018-01-05 NOTE — Assessment & Plan Note (Signed)
C/o malodorous discharge, specimens sent for testing for yeast, BV and trich

## 2018-01-05 NOTE — Assessment & Plan Note (Signed)
Antibiotic , decongestant and cough suppressant prescribed 

## 2018-01-05 NOTE — Assessment & Plan Note (Signed)
F/u in 2 monhts DASH diet and commitment to daily physical activity for a minimum of 30 minutes discussed and encouraged, as a part of hypertension management. The importance of attaining a healthy weight is also discussed.  BP/Weight 01/05/2018 08/13/2017 05/05/2017 02/10/2017 09/03/2016 02/19/2016 2/97/9892  Systolic BP 119 417 408 144 818 563 149  Diastolic BP 70 80 84 80 80 74 80  Wt. (Lbs) 313.08 317.5 331.8 325 321 328 323.2  BMI 47.6 48.28 50.45 50.9 50.28 51.37 49.15

## 2018-01-05 NOTE — Progress Notes (Signed)
Shannon Roth     MRN: [id number]      DOB: 10/05/1965  HPI: Patient is in for annual physical exam. 1 week h/o worsening head and chest congestion, associated with fever and chills intermittently. Nasal drainage has thickened , and is yellowish green, and at times bloody. Sputum is thick and yellow.  Increasing fatigue , poor appetitie and sleep disturbed by cough. C/o malodorous vaginal discharge wants this cheked though not active in over 20 years No improvement with OTC medication. Immunization is reviewed , and  updated.   PE: BP (!) 148/70 (BP Location: Right Arm, Patient Position: Sitting) Comment (Cuff Size): thigh  Pulse 72   Resp 12   Ht 5\' 8"  (1.727 m)   Wt (!) 313 lb 1.3 oz (142 kg)   SpO2 97%   BMI 47.60 kg/m   Pleasant  female, alert and oriented x 3, in no cardio-pulmonary distress. Afebrile. HEENT No facial trauma or asymetry.Maxillary  Sinus  tender.  Extra occullar muscles intact,  External ears normal, tympanic membranes clear. Oropharynx moist, no exudate. Neck: supple, no adenopathy,JVD or thyromegaly.No bruits.  Chest: Decreased though adequate air entry throughout scattered crackles no  wheezes. Non tender to palpation  Breast: No asymetry,no masses or lumps. No tenderness. No nipple discharge or inversion. No axillary or supraclavicular adenopathy  Cardiovascular system; Heart sounds normal,  S1 and  S2 ,no S3.  No murmur, or thrill. Apical beat not displaced Peripheral pulses normal.  Abdomen: Soft, non tender, no organomegaly or masses. No bruits. Bowel sounds normal. No guarding, tenderness or rebound.  Rectal:  Deferred currently being evaluated by GI and returning stool cards  GU: External genitalia normal female genitalia , normal female distribution of hair. No lesions. Urethral meatus normal in size, no  Prolapse, no lesions visibly  Present. Bladder non tender. Vagina pink and moist , with no visible lesions ,white   discharge present .Fishy odour Adequate pelvic support no  cystocele or rectocele noted lesions or ulcerations noted,  discharge noted f Uterus absent , no adnexal masses, no  adnexal tenderness.   Musculoskeletal exam: Decreased ROM of spine, hips , shoulders and knees. No deformity ,swelling or crepitus noted. No muscle wasting or atrophy.   Neurologic: Cranial nerves 2 to 12 intact. Power, tone ,sensation and reflexes normal throughout. No disturbance in gait. No tremor.  Skin: Intact, no ulceration, erythema , scaling or rash noted. Pigmentation normal throughout  Psych; Normal mood and affect. Judgement and concentration normal   Assessment & Plan:  Annual physical exam Annual exam as documented. Counseling done  re healthy lifestyle involving commitment to 150 minutes exercise per week, heart healthy diet, and attaining healthy weight.The importance of adequate sleep also discussed. Regular seat belt use and home safety, is also discussed. Changes in health habits are decided on by the patient with goals and time frames  set for achieving them. Immunization and cancer screening needs are specifically addressed at this visit.   Acute sinusitis antibiotic and fluconazole prescribed  Acute bronchitis Antibiotic, decongestant and cough suppressant prescribed  Morbid obesity Improved. Patient re-educated about  the importance of commitment to a  minimum of 150 minutes of exercise per week.  The importance of healthy food choices with portion control discussed. Encouraged to start a food diary, count calories and to consider  joining a support group. Sample diet sheets offered. Goals set by the patient for the next several months.   Weight /BMI 01/05/2018 08/13/2017 05/05/2017  WEIGHT 313 lb 1.3 oz 317 lb 8 oz 331 lb 12.8 oz  HEIGHT 5\' 8"  5\' 8"  5\' 8"   BMI 47.6 kg/m2 48.28 kg/m2 50.45 kg/m2      Vulvovaginitis C/o malodorous discharge, specimens sent for testing  for yeast, BV and trich  Need for diphtheria-tetanus-pertussis (Tdap) vaccine After obtaining informed consent, the vaccine is  administered by CMA   Elevated blood pressure reading in office with diagnosis of hypertension F/u in 2 monhts DASH diet and commitment to daily physical activity for a minimum of 30 minutes discussed and encouraged, as a part of hypertension management. The importance of attaining a healthy weight is also discussed.  BP/Weight 01/05/2018 08/13/2017 05/05/2017 02/10/2017 09/03/2016 02/19/2016 0/35/0093  Systolic BP 818 299 371 696 [phone]  Diastolic BP 70 80 84 80 80 74 80  Wt. (Lbs) 313.08 317.5 331.8 325 321 328 323.2  BMI 47.6 48.28 50.45 50.9 50.28 51.37 49.15       \

## 2018-01-05 NOTE — Assessment & Plan Note (Signed)

## 2018-01-05 NOTE — Assessment & Plan Note (Signed)
After obtaining informed consent, the vaccine is  administered by CMA

## 2018-01-05 NOTE — Patient Instructions (Signed)
F/U in  2 months, call if you need me before  Please schedule mammogram at checkout ( OK to call pt with appt)  TdAP today  Blood pressure elevated today  You have been treated for sinuitis and bronchitis , [facility]  Pap and wet  prep sent , we will contact you with results  Please increase fruit and vegetable and reduce sweets, bread and pasta, only water, weight loss goal of 60 to 8 pouinds  It is important that you exercise regularly at least 30 minutes 5 times a week. If you develop chest pain, have severe difficulty breathing, or feel very tired, stop exercising immediately and seek medical attention     Please get fasting, lipid, cmp and EGFr, hBA1c, TSH and vit D 1 as soon as possible

## 2018-01-05 NOTE — Assessment & Plan Note (Signed)
antibiotic and fluconazole prescribed

## 2018-01-06 ENCOUNTER — Other Ambulatory Visit: Payer: Self-pay | Admitting: Family Medicine

## 2018-01-06 ENCOUNTER — Other Ambulatory Visit: Payer: Self-pay

## 2018-01-06 DIAGNOSIS — J209 Acute bronchitis, unspecified: Secondary | ICD-10-CM

## 2018-01-06 MED ORDER — PROMETHAZINE-DM 6.25-15 MG/5ML PO SYRP: ORAL_SOLUTION | ORAL | 0 refills | Status: DC

## 2018-01-06 NOTE — Progress Notes (Signed)
Pt is aware.  

## 2018-01-12 LAB — CYTOLOGY - PAP
Adequacy: ABSENT
Bacterial vaginitis: NEGATIVE
Candida vaginitis: NEGATIVE
Chlamydia: NEGATIVE
Diagnosis: NEGATIVE
HPV: NOT DETECTED
Neisseria Gonorrhea: NEGATIVE
Trichomonas: NEGATIVE

## 2018-03-16 ENCOUNTER — Ambulatory Visit (INDEPENDENT_AMBULATORY_CARE_PROVIDER_SITE_OTHER): Payer: Commercial Managed Care - PPO | Admitting: Gastroenterology

## 2018-03-16 ENCOUNTER — Encounter: Payer: Self-pay | Admitting: Gastroenterology

## 2018-03-16 DIAGNOSIS — D509 Iron deficiency anemia, unspecified: Secondary | ICD-10-CM

## 2018-03-16 MED ORDER — NAPROXEN SODIUM 550 MG PO TABS: 550.0000 mg | ORAL_TABLET | Freq: Two times a day (BID) | ORAL | 0 refills | Status: DC

## 2018-03-16 NOTE — Assessment & Plan Note (Signed)
ON NSAIDS/NO PPI.  CONTINUE YOUR WEIGHT LOSS EFFORTS. Take ibuprofen with food or milk. CONSIDER PPI DAILY. COMPLETE LABS WITHIN THE NEXT 7 DAYS. MAY NEED AN UPPER ENDOSCOPY OR GIVENS CAPSULE STUDY(THE CAMERA PILL) BIOPSY RESULTS WILL BE BACK IN 5 BUSINESS DAYS AFTER THEY ARE DRAWN. FOLLOW UP IN 6 MOS.

## 2018-03-16 NOTE — Patient Instructions (Signed)
CONTINUE YOUR WEIGHT LOSS EFFORTS.  Take ibuprofen with food or milk.   COMPLETE LABS WITHIN THE NEXT 7 DAYS. YOU MAY NEED AN UPPER ENDOSCOPY OR GIVENS CAPSULE STUDY(THE CAMERA PILL)  YOUR BIOPSY RESULTS WILL BE BACK IN 5 BUSINESS DAYS AFTER THEY ARE DRAWN.  FOLLOW UP IN 6 MOS.

## 2018-03-16 NOTE — Progress Notes (Signed)
   Subjective:    Patient ID: Shannon Roth, female    DOB: 07/03/1965, 52 y.o.   MRN: [id number]  HPI    Review of Systems     Objective:   Physical Exam        Assessment & Plan:

## 2018-03-16 NOTE — Progress Notes (Signed)
   Subjective:    Patient ID: Shannon Roth, female    DOB: 09/16/1965, 52 y.o.   MRN: [id number] Fayrene Helper, MD   HPI PT CONCERNED ABOUT HER BP. ABOUT TO HAVE 2ND GRANDKID. BOWELS WILL GET LOOSE/NL STOOL IF SHE EATS FRESH FRUITS, SALAD, AND JELLO WITH FRUIT. IF TAKES IBUPROFEN WILL GET NAUSEA/EPIGASTRIC PAIN. USING IBUPROFEN: FREQUENTLY.  NEVER TRIED NAPROXEN KEEPS PEPTO FOR  AROUND FOR INDIGESTION. TRYING TO LOSE WEIGHT 330 LBS TO 310 LBS. HAS A TWIN BROTHER WEIGHS ~500 LBS. NO ASPIRIN, OR BC/GOODY POWDERS.     PT DENIES FEVER, CHILLS, HEMATOCHEZIA, HEMATEMESIS, vomiting, melena, CHEST PAIN, SHORTNESS OF BREATH,  CHANGE IN BOWEL IN HABITS, constipation, problems swallowing, problems with sedation, OR heartburn or indigestion.  Past Medical History:  Diagnosis Date  . Arthritis   . Cough   . Migraine   . Migraine 2002  . Obesity   . Rectal bleeding    Past Surgical History:  Procedure Laterality Date  . bilateral BIOPSY BREAST  2002   benign  . Bilateral tubal ligation  2001  . CHOLECYSTECTOMY  2003  . COLONOSCOPY N/A 06/21/2015   SLF: hyperplastic polyps removed. next TCS 10 years  . EYE SURGERY     right cataract removal   . TOTAL ABDOMINAL HYSTERECTOMY W/ BILATERAL SALPINGOOPHORECTOMY  2003  . TUBAL LIGATION     Allergies  Allergen Reactions  . Codeine Nausea And Vomiting  . Penicillins Rash   Current Outpatient Medications  Medication Sig    . albuterol (VENTOLIN HFA) 108 (90 Base) MCG/ACT inhaler Inhale 2 puffs into the lungs every 6 (six) hours as needed for wheezing or shortness of breath.    . benzonatate (TESSALON) 100 MG capsule Take 1 capsule (100 mg total) by mouth 2 (two) times daily as needed for cough.    . diclofenac sodium (VOLTAREN) 1 % GEL Apply topically daily.    . fluconazole (DIFLUCAN) 150 MG tablet One tablet  Once daily  As neede  For vaginal itch    . ibuprofen (ADVIL,MOTRIN) 800 MG tablet Take 1 tablet (800 mg total) by mouth every 8  (eight) hours as needed.    . ondansetron (ZOFRAN) 8 MG tablet Take 1 tablet (8 mg total) by mouth every 8 (eight) hours as needed for nausea or vomiting.    .      .      .       Review of Systems PER HPI OTHERWISE ALL SYSTEMS ARE NEGATIVE.    Objective:   Physical Exam  Constitutional: She is oriented to person, place, and time. She appears well-developed and well-nourished. No distress.  HENT:  Head: Normocephalic and atraumatic.  Mouth/Throat: Oropharynx is clear and moist. No oropharyngeal exudate.  Eyes: Pupils are equal, round, and reactive to light. No scleral icterus.  Neck: Normal range of motion. Neck supple.  Cardiovascular: Normal rate, regular rhythm and normal heart sounds.  Pulmonary/Chest: Effort normal and breath sounds normal. No respiratory distress.  Abdominal: Soft. Bowel sounds are normal. She exhibits no distension. There is no tenderness.  Musculoskeletal: She exhibits no edema.  Lymphadenopathy:    She has no cervical adenopathy.  Neurological: She is alert and oriented to person, place, and time.  Psychiatric: She has a normal mood and affect.  Vitals reviewed.     Assessment & Plan:

## 2018-03-16 NOTE — Progress Notes (Signed)
ON RECALL  °

## 2018-03-16 NOTE — Progress Notes (Signed)
CC'ED TO PCP 

## 2018-03-17 ENCOUNTER — Telehealth: Payer: Self-pay | Admitting: *Deleted

## 2018-03-17 ENCOUNTER — Encounter: Payer: Self-pay | Admitting: *Deleted

## 2018-03-17 ENCOUNTER — Ambulatory Visit: Payer: Commercial Managed Care - PPO | Admitting: Family Medicine

## 2018-03-17 ENCOUNTER — Other Ambulatory Visit: Payer: Self-pay | Admitting: *Deleted

## 2018-03-17 DIAGNOSIS — D509 Iron deficiency anemia, unspecified: Secondary | ICD-10-CM

## 2018-03-17 LAB — CBC WITH DIFFERENTIAL/PLATELET
Basophils Absolute: 20 cells/uL (ref 0–200)
Basophils Relative: 0.3 %
Eosinophils Absolute: 98 cells/uL (ref 15–500)
Eosinophils Relative: 1.5 %
HCT: 37.8 % (ref 35.0–45.0)
Hemoglobin: 11.3 g/dL — ABNORMAL LOW (ref 11.7–15.5)
Lymphs Abs: 2269 cells/uL (ref 850–3900)
MCH: 22.6 pg — ABNORMAL LOW (ref 27.0–33.0)
MCHC: 29.9 g/dL — ABNORMAL LOW (ref 32.0–36.0)
MCV: 75.6 fL — ABNORMAL LOW (ref 80.0–100.0)
MPV: 11.4 fL (ref 7.5–12.5)
Monocytes Relative: 8.3 %
Neutro Abs: 3575 cells/uL (ref 1500–7800)
Neutrophils Relative %: 55 %
Platelets: 342 10*3/uL (ref 140–400)
RBC: 5 10*6/uL (ref 3.80–5.10)
RDW: 13.4 % (ref 11.0–15.0)
Total Lymphocyte: 34.9 %
WBC mixed population: 540 cells/uL (ref 200–950)
WBC: 6.5 10*3/uL (ref 3.8–10.8)

## 2018-03-17 LAB — FERRITIN: Ferritin: 274 ng/mL — ABNORMAL HIGH (ref 16–232)

## 2018-03-17 NOTE — Telephone Encounter (Signed)
Pre-op scheduled for 05/11/18 at 9:00am. Letter mailed. LMOVM.

## 2018-03-17 NOTE — Telephone Encounter (Signed)
PA submitted to [facility] for egd. +/-givens.

## 2018-03-28 NOTE — Telephone Encounter (Signed)
Received fax that procedures were deemed medically necessary. Reference # [id number] date 05/17/18

## 2018-05-03 NOTE — Patient Instructions (Signed)
Shannon Roth  05/03/2018     @PREFPERIOPPHARMACY @   Your procedure is scheduled on  05/17/2018.  Report to [facility] at  1215   P.M.  Call this number if you have problems the morning of surgery:  [phone]   Remember:  Follow the diet instructions given to you by Dr Nona Dell office.                  Take these medicines the morning of surgery with A SIP OF WATER  Zofran ( if needed). Use your inhaler before you come.    Do not wear jewelry, make-up or nail polish.  Do not wear lotions, powders, or perfumes, or deodorant.  Do not shave 48 hours prior to surgery.  Men may shave face and neck.  Do not bring valuables to the hospital.  [facility] is not responsible for any belongings or valuables.  Contacts, dentures or bridgework may not be worn into surgery.  Leave your suitcase in the car.  After surgery it may be brought to your room.  For patients admitted to the hospital, discharge time will be determined by your treatment team.  Patients discharged the day of surgery will not be allowed to drive home.   Name and phone number of your driver:   family Special instructions:  None  Please read over the following fact sheets that you were given. Anesthesia Post-op Instructions and Care and Recovery After Surgery       Esophagogastroduodenoscopy Esophagogastroduodenoscopy (EGD) is a procedure to examine the lining of the esophagus, stomach, and first part of the small intestine (duodenum). This procedure is done to check for problems such as inflammation, bleeding, ulcers, or growths. During this procedure, a long, flexible, lighted tube with a camera attached (endoscope) is inserted down the throat. Tell a health care provider about:  Any allergies you have.  All medicines you are taking, including vitamins, herbs, eye drops, creams, and over-the-counter medicines.  Any problems you or family members have had with anesthetic medicines.  Any  blood disorders you have.  Any surgeries you have had.  Any medical conditions you have.  Whether you are pregnant or may be pregnant. What are the risks? Generally, this is a safe procedure. However, problems may occur, including:  Infection.  Bleeding.  A tear (perforation) in the esophagus, stomach, or duodenum.  Trouble breathing.  Excessive sweating.  Spasms of the larynx.  A slowed heartbeat.  Low blood pressure.  What happens before the procedure?  Follow instructions from your health care provider about eating or drinking restrictions.  Ask your health care provider about: ? Changing or stopping your regular medicines. This is especially important if you are taking diabetes medicines or blood thinners. ? Taking medicines such as aspirin and ibuprofen. These medicines can thin your blood. Do not take these medicines before your procedure if your health care provider instructs you not to.  Plan to have someone take you home after the procedure.  If you wear dentures, be ready to remove them before the procedure. What happens during the procedure?  To reduce your risk of infection, your health care team will wash or sanitize their hands.  An IV tube will be put in a vein in your hand or arm. You will get medicines and fluids through this tube.  You will be given one or more of the following: ? A medicine to help you relax (  sedative). ? A medicine to numb the area (local anesthetic). This medicine may be sprayed into your throat. It will make you feel more comfortable and keep you from gagging or coughing during the procedure. ? A medicine for pain.  A mouth guard may be placed in your mouth to protect your teeth and to keep you from biting on the endoscope.  You will be asked to lie on your left side.  The endoscope will be lowered down your throat into your esophagus, stomach, and duodenum.  Air will be put into the endoscope. This will help your health  care provider see better.  The lining of your esophagus, stomach, and duodenum will be examined.  Your health care provider may: ? Take a tissue sample so it can be looked at in a lab (biopsy). ? Remove growths. ? Remove objects (foreign bodies) that are stuck. ? Treat any bleeding with medicines or other devices that stop tissue from bleeding. ? Widen (dilate) or stretch narrowed areas of your esophagus and stomach.  The endoscope will be taken out. The procedure may vary among health care providers and hospitals. What happens after the procedure?  Your blood pressure, heart rate, breathing rate, and blood oxygen level will be monitored often until the medicines you were given have worn off.  Do not eat or drink anything until the numbing medicine has worn off and your gag reflex has returned. This information is not intended to replace advice given to you by your health care provider. Make sure you discuss any questions you have with your health care provider. Document Released: 09/25/2004 Document Revised: 10/31/2015 Document Reviewed: 04/18/2015 Elsevier Interactive Patient Education  2018 [employer]. Esophagogastroduodenoscopy, Care After Refer to this sheet in the next few weeks. These instructions provide you with information about caring for yourself after your procedure. Your health care provider may also give you more specific instructions. Your treatment has been planned according to current medical practices, but problems sometimes occur. Call your health care provider if you have any problems or questions after your procedure. What can I expect after the procedure? After the procedure, it is common to have:  A sore throat.  Nausea.  Bloating.  Dizziness.  Fatigue.  Follow these instructions at home:  Do not eat or drink anything until the numbing medicine (local anesthetic) has worn off and your gag reflex has returned. You will know that the local anesthetic has  worn off when you can swallow comfortably.  Do not drive for 24 hours if you received a medicine to help you relax (sedative).  If your health care provider took a tissue sample for testing during the procedure, make sure to get your test results. This is your responsibility. Ask your health care provider or the department performing the test when your results will be ready.  Keep all follow-up visits as told by your health care provider. This is important. Contact a health care provider if:  You cannot stop coughing.  You are not urinating.  You are urinating less than usual. Get help right away if:  You have trouble swallowing.  You cannot eat or drink.  You have throat or chest pain that gets worse.  You are dizzy or light-headed.  You faint.  You have nausea or vomiting.  You have chills.  You have a fever.  You have severe abdominal pain.  You have black, tarry, or bloody stools. This information is not intended to replace advice given to you by  your health care provider. Make sure you discuss any questions you have with your health care provider. Document Released: 05/11/2012 Document Revised: 10/31/2015 Document Reviewed: 04/18/2015 Elsevier Interactive Patient Education  2018 [place] Anesthesia is a term that refers to techniques, procedures, and medicines that help a person stay safe and comfortable during a medical procedure. Monitored anesthesia care, or sedation, is one type of anesthesia. Your anesthesia specialist may recommend sedation if you will be having a procedure that does not require you to be unconscious, such as:  Cataract surgery.  A dental procedure.  A biopsy.  A colonoscopy.  During the procedure, you may receive a medicine to help you relax (sedative). There are three levels of sedation:  Mild sedation. At this level, you may feel awake and relaxed. You will be able to follow directions.  Moderate  sedation. At this level, you will be sleepy. You may not remember the procedure.  Deep sedation. At this level, you will be asleep. You will not remember the procedure.  The more medicine you are given, the deeper your level of sedation will be. Depending on how you respond to the procedure, the anesthesia specialist may change your level of sedation or the type of anesthesia to fit your needs. An anesthesia specialist will monitor you closely during the procedure. Let your health care provider know about:  Any allergies you have.  All medicines you are taking, including vitamins, herbs, eye drops, creams, and over-the-counter medicines.  Any use of steroids (by mouth or as a cream).  Any problems you or family members have had with sedatives and anesthetic medicines.  Any blood disorders you have.  Any surgeries you have had.  Any medical conditions you have, such as sleep apnea.  Whether you are pregnant or may be pregnant.  Any use of cigarettes, alcohol, or street drugs. What are the risks? Generally, this is a safe procedure. However, problems may occur, including:  Getting too much medicine (oversedation).  Nausea.  Allergic reaction to medicines.  Trouble breathing. If this happens, a breathing tube may be used to help with breathing. It will be removed when you are awake and breathing on your own.  Heart trouble.  Lung trouble.  Before the procedure Staying hydrated Follow instructions from your health care provider about hydration, which may include:  Up to 2 hours before the procedure - you may continue to drink clear liquids, such as water, clear fruit juice, black coffee, and plain tea.  Eating and drinking restrictions Follow instructions from your health care provider about eating and drinking, which may include:  8 hours before the procedure - stop eating heavy meals or foods such as meat, fried foods, or fatty foods.  6 hours before the procedure -  stop eating light meals or foods, such as toast or cereal.  6 hours before the procedure - stop drinking milk or drinks that contain milk.  2 hours before the procedure - stop drinking clear liquids.  Medicines Ask your health care provider about:  Changing or stopping your regular medicines. This is especially important if you are taking diabetes medicines or blood thinners.  Taking medicines such as aspirin and ibuprofen. These medicines can thin your blood. Do not take these medicines before your procedure if your health care provider instructs you not to.  Tests and exams  You will have a physical exam.  You may have blood tests done to show: ? How well your kidneys and  liver are working. ? How well your blood can clot.  General instructions  Plan to have someone take you home from the hospital or clinic.  If you will be going home right after the procedure, plan to have someone with you for 24 hours.  What happens during the procedure?  Your blood pressure, heart rate, breathing, level of pain and overall condition will be monitored.  An IV tube will be inserted into one of your veins.  Your anesthesia specialist will give you medicines as needed to keep you comfortable during the procedure. This may mean changing the level of sedation.  The procedure will be performed. After the procedure  Your blood pressure, heart rate, breathing rate, and blood oxygen level will be monitored until the medicines you were given have worn off.  Do not drive for 24 hours if you received a sedative.  You may: ? Feel sleepy, clumsy, or nauseous. ? Feel forgetful about what happened after the procedure. ? Have a sore throat if you had a breathing tube during the procedure. ? Vomit. This information is not intended to replace advice given to you by your health care provider. Make sure you discuss any questions you have with your health care provider. Document Released: 02/18/2005  Document Revised: 11/01/2015 Document Reviewed: 09/15/2015 Elsevier Interactive Patient Education  2018 [place], Care After These instructions provide you with information about caring for yourself after your procedure. Your health care provider may also give you more specific instructions. Your treatment has been planned according to current medical practices, but problems sometimes occur. Call your health care provider if you have any problems or questions after your procedure. What can I expect after the procedure? After your procedure, it is common to:  Feel sleepy for several hours.  Feel clumsy and have poor balance for several hours.  Feel forgetful about what happened after the procedure.  Have poor judgment for several hours.  Feel nauseous or vomit.  Have a sore throat if you had a breathing tube during the procedure.  Follow these instructions at home: For at least 24 hours after the procedure:   Do not: ? Participate in activities in which you could fall or become injured. ? Drive. ? Use heavy machinery. ? Drink alcohol. ? Take sleeping pills or medicines that cause drowsiness. ? Make important decisions or sign legal documents. ? Take care of children on your own.  Rest. Eating and drinking  Follow the diet that is recommended by your health care provider.  If you vomit, drink water, juice, or soup when you can drink without vomiting.  Make sure you have little or no nausea before eating solid foods. General instructions  Have a responsible adult stay with you until you are awake and alert.  Take over-the-counter and prescription medicines only as told by your health care provider.  If you smoke, do not smoke without supervision.  Keep all follow-up visits as told by your health care provider. This is important. Contact a health care provider if:  You keep feeling nauseous or you keep vomiting.  You feel  light-headed.  You develop a rash.  You have a fever. Get help right away if:  You have trouble breathing. This information is not intended to replace advice given to you by your health care provider. Make sure you discuss any questions you have with your health care provider. Document Released: 09/15/2015 Document Revised: 01/15/2016 Document Reviewed: 09/15/2015 Elsevier Interactive Patient Education  2018 [employer]  

## 2018-05-11 ENCOUNTER — Other Ambulatory Visit: Payer: Self-pay

## 2018-05-11 ENCOUNTER — Encounter (HOSPITAL_COMMUNITY)
Admission: RE | Admit: 2018-05-11 | Discharge: 2018-05-11 | Disposition: A | Discharge status: Home / Self Care | Payer: Commercial Managed Care - PPO | Source: Ambulatory Visit | Attending: Gastroenterology | Admitting: Gastroenterology

## 2018-05-11 DIAGNOSIS — Z01812 Encounter for preprocedural laboratory examination: Secondary | ICD-10-CM | POA: Insufficient documentation

## 2018-05-11 LAB — CBC WITH DIFFERENTIAL/PLATELET
Abs Immature Granulocytes: 0.03 10*3/uL (ref 0.00–0.07)
Basophils Absolute: 0 10*3/uL (ref 0.0–0.1)
Basophils Relative: 1 %
Eosinophils Absolute: 0.1 10*3/uL (ref 0.0–0.5)
Eosinophils Relative: 2 %
HCT: 39.9 % (ref 36.0–46.0)
Hemoglobin: 11.4 g/dL — ABNORMAL LOW (ref 12.0–15.0)
Immature Granulocytes: 1 %
Lymphocytes Relative: 39 %
Lymphs Abs: 2.5 10*3/uL (ref 0.7–4.0)
MCH: 22.5 pg — ABNORMAL LOW (ref 26.0–34.0)
MCHC: 28.6 g/dL — ABNORMAL LOW (ref 30.0–36.0)
MCV: 78.7 fL — ABNORMAL LOW (ref 80.0–100.0)
Monocytes Absolute: 0.6 10*3/uL (ref 0.1–1.0)
Monocytes Relative: 9 %
Neutro Abs: 3.3 10*3/uL (ref 1.7–7.7)
Neutrophils Relative %: 48 %
Platelets: 317 10*3/uL (ref 150–400)
RBC: 5.07 MIL/uL (ref 3.87–5.11)
RDW: 13.9 % (ref 11.5–15.5)
WBC: 6.6 10*3/uL (ref 4.0–10.5)
nRBC: 0 % (ref 0.0–0.2)

## 2018-05-12 NOTE — Progress Notes (Signed)
PT is aware.

## 2018-05-17 ENCOUNTER — Ambulatory Visit (HOSPITAL_COMMUNITY)
Admission: RE | Admit: 2018-05-17 | Discharge: 2018-05-17 | Disposition: A | Discharge status: Home / Self Care | Payer: Commercial Managed Care - PPO | Source: Ambulatory Visit | Attending: Gastroenterology | Admitting: Gastroenterology

## 2018-05-17 ENCOUNTER — Ambulatory Visit (HOSPITAL_COMMUNITY): Payer: Commercial Managed Care - PPO | Admitting: Anesthesiology

## 2018-05-17 ENCOUNTER — Encounter (HOSPITAL_COMMUNITY)
Admission: RE | Disposition: A | Discharge status: Home / Self Care | Payer: Self-pay | Source: Ambulatory Visit | Attending: Gastroenterology

## 2018-05-17 ENCOUNTER — Encounter (HOSPITAL_COMMUNITY): Payer: Self-pay

## 2018-05-17 DIAGNOSIS — K319 Disease of stomach and duodenum, unspecified: Secondary | ICD-10-CM | POA: Diagnosis not present

## 2018-05-17 DIAGNOSIS — K2980 Duodenitis without bleeding: Secondary | ICD-10-CM | POA: Insufficient documentation

## 2018-05-17 DIAGNOSIS — M1990 Unspecified osteoarthritis, unspecified site: Secondary | ICD-10-CM | POA: Diagnosis not present

## 2018-05-17 DIAGNOSIS — Z885 Allergy status to narcotic agent status: Secondary | ICD-10-CM | POA: Diagnosis not present

## 2018-05-17 DIAGNOSIS — Z7722 Contact with and (suspected) exposure to environmental tobacco smoke (acute) (chronic): Secondary | ICD-10-CM | POA: Diagnosis not present

## 2018-05-17 DIAGNOSIS — Z8249 Family history of ischemic heart disease and other diseases of the circulatory system: Secondary | ICD-10-CM | POA: Insufficient documentation

## 2018-05-17 DIAGNOSIS — D649 Anemia, unspecified: Secondary | ICD-10-CM

## 2018-05-17 DIAGNOSIS — D509 Iron deficiency anemia, unspecified: Secondary | ICD-10-CM | POA: Diagnosis not present

## 2018-05-17 DIAGNOSIS — Z791 Long term (current) use of non-steroidal anti-inflammatories (NSAID): Secondary | ICD-10-CM | POA: Diagnosis not present

## 2018-05-17 DIAGNOSIS — K2970 Gastritis, unspecified, without bleeding: Secondary | ICD-10-CM

## 2018-05-17 DIAGNOSIS — Z880 Allergy status to penicillin: Secondary | ICD-10-CM | POA: Diagnosis not present

## 2018-05-17 HISTORY — PX: GIVENS CAPSULE STUDY: SHX5432

## 2018-05-17 HISTORY — PX: ESOPHAGOGASTRODUODENOSCOPY (EGD) WITH PROPOFOL: SHX5813

## 2018-05-17 HISTORY — PX: BIOPSY: SHX5522

## 2018-05-17 SURGERY — ESOPHAGOGASTRODUODENOSCOPY (EGD) WITH PROPOFOL
Anesthesia: General

## 2018-05-17 MED ORDER — LIDOCAINE VISCOUS HCL 2 % MT SOLN: 10.0000 mL | Freq: Once | OROMUCOSAL | Status: DC

## 2018-05-17 MED ORDER — CHLORHEXIDINE GLUCONATE CLOTH 2 % EX PADS: 6.0000 | MEDICATED_PAD | Freq: Once | CUTANEOUS | Status: DC

## 2018-05-17 MED ORDER — LACTATED RINGERS IV SOLN
INTRAVENOUS | Status: DC | PRN
  Administered 2018-05-17: 13:00:00 via INTRAVENOUS

## 2018-05-17 MED ORDER — PROPOFOL 10 MG/ML IV BOLUS: INTRAVENOUS | Status: DC | PRN

## 2018-05-17 MED ORDER — STERILE WATER FOR IRRIGATION IR SOLN
Status: DC | PRN
  Administered 2018-05-17: 100 mL

## 2018-05-17 MED ORDER — MIDAZOLAM HCL 5 MG/5ML IJ SOLN
INTRAMUSCULAR | Status: DC | PRN
  Administered 2018-05-17 (×2): 1 mg via INTRAVENOUS

## 2018-05-17 MED ORDER — MIDAZOLAM HCL 2 MG/2ML IJ SOLN
INTRAMUSCULAR | Status: AC
  Filled 2018-05-17: qty 2

## 2018-05-17 MED ORDER — LIDOCAINE VISCOUS HCL 2 % MT SOLN
OROMUCOSAL | Status: AC
  Filled 2018-05-17: qty 15

## 2018-05-17 MED ORDER — LIDOCAINE VISCOUS HCL 2 % MT SOLN
OROMUCOSAL | Status: DC | PRN
  Administered 2018-05-17: 4 mL via OROMUCOSAL

## 2018-05-17 MED ORDER — PROPOFOL 500 MG/50ML IV EMUL
INTRAVENOUS | Status: DC | PRN
  Administered 2018-05-17: 125 ug/kg/min via INTRAVENOUS
  Administered 2018-05-17: 13:00:00 via INTRAVENOUS

## 2018-05-17 MED ORDER — LACTATED RINGERS IV SOLN
INTRAVENOUS | Status: DC
  Administered 2018-05-17: 13:00:00 via INTRAVENOUS

## 2018-05-17 MED ORDER — MIDAZOLAM HCL 2 MG/2ML IJ SOLN: 0.5000 mg | Freq: Once | INTRAMUSCULAR | Status: DC | PRN

## 2018-05-17 MED ORDER — OMEPRAZOLE 20 MG PO CPDR: DELAYED_RELEASE_CAPSULE | ORAL | 3 refills | Status: DC

## 2018-05-17 MED ORDER — PROMETHAZINE HCL 25 MG/ML IJ SOLN: 6.2500 mg | INTRAMUSCULAR | Status: DC | PRN

## 2018-05-17 NOTE — H&P (Signed)
Primary Care Physician:  Fayrene Helper, MD Primary Gastroenterologist:  Dr. Oneida Alar  Pre-Procedure History & Physical: HPI:  Shannon Roth is a 52 y.o. female here for Anemia-microcytic on naproxen/ibuprofen.  Past Medical History:  Diagnosis Date  . Arthritis   . Cough   . Migraine   . Migraine 2002  . Obesity   . Rectal bleeding     Past Surgical History:  Procedure Laterality Date  . bilateral BIOPSY BREAST  2002   benign  . Bilateral tubal ligation  2001  . CHOLECYSTECTOMY  2003  . COLONOSCOPY N/A 06/21/2015   SLF: hyperplastic polyps removed. next TCS 10 years  . EYE SURGERY     right cataract removal   . TOTAL ABDOMINAL HYSTERECTOMY W/ BILATERAL SALPINGOOPHORECTOMY  2003  . TUBAL LIGATION      Prior to Admission medications   Medication Sig Start Date End Date Taking? Authorizing Provider  Biotin 10 MG CAPS Take 10 mg by mouth daily.   Yes [provider]  diclofenac sodium (VOLTAREN) 1 % GEL Apply 1 application topically daily.    Yes [provider]  naproxen (NAPROSYN) 500 MG tablet Take 500 mg by mouth daily as needed for moderate pain.   Yes [provider]    Allergies as of 03/17/2018 - Review Complete 03/16/2018  Allergen Reaction Noted  . Codeine Nausea And Vomiting 05/10/2008  . Penicillins Rash 09/12/2007    Family History  Problem Relation Age of Onset  . Asthma Mother   . Diabetes Mother   . Arthritis Mother   . Heart disease Mother   . Hypertension Father   . Emphysema Paternal Grandfather   . Emphysema Maternal Grandmother   . Lung cancer Maternal Grandmother   . Breast cancer Sister   . Stroke Sister   . Diabetes Brother   . Heart disease Brother   . Colon cancer Paternal Uncle        55s  . Liver disease Neg Hx     Social History   Socioeconomic History  . Marital status: Single    Spouse name: Not on file  . Number of children: 1  . Years of education: Not on file  . Highest education  level: Not on file  Occupational History  . Occupation: Orthoptist  Social Needs  . Financial resource strain: Not on file  . Food insecurity:    Worry: Not on file    Inability: Not on file  . Transportation needs:    Medical: Not on file    Non-medical: Not on file  Tobacco Use  . Smoking status: Passive Smoke Exposure - Never Smoker  . Smokeless tobacco: Never Used  . Tobacco comment: Father & close friend  Substance and Sexual Activity  . Alcohol use: No    Alcohol/week: 0.0 standard drinks  . Drug use: No  . Sexual activity: Not on file  Lifestyle  . Physical activity:    Days per week: Not on file    Minutes per session: Not on file  . Stress: Not on file  Relationships  . Social connections:    Talks on phone: Not on file    Gets together: Not on file    Attends religious service: Not on file    Active member of club or organization: Not on file    Attends meetings of clubs or organizations: Not on file    Relationship status: Not on file  . Intimate partner violence:  Fear of current or ex partner: Not on file    Emotionally abused: Not on file    Physically abused: Not on file    Forced sexual activity: Not on file  Other Topics Concern  . Not on file  Social History Narrative   Originally from [place]. 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 She reports that she wore a full body suit but questionable respiratory equipment for protection. No bird, mold, or hot tub exposure.     Review of Systems: See HPI, otherwise negative ROS   Physical Exam: BP 138/70   Pulse (!) 53   Temp 98.3 F (36.8 C) (Oral)   Resp 15   SpO2 98%  General:   Alert,  pleasant and cooperative in NAD Head:  Normocephalic and atraumatic. Neck:  Supple; Lungs:  Clear throughout to auscultation.    Heart:  Regular rate and rhythm. Abdomen:  Soft, nontender and nondistended. Normal bowel sounds, without guarding, and without rebound.   Neurologic:  Alert  and  oriented x4;  grossly normal neurologically.  Impression/Plan:     Anemia-microcytic on naproxen/ibuprofen  PLAN:  1. EGD/? Givens capsule placement TODAY. DISCUSSED PROCEDURE, BENEFITS, & RISKS: < 1% chance of medication reaction, bleeding, OR perforation.

## 2018-05-17 NOTE — Op Note (Signed)
[facility] Patient Name: Shannon Roth Procedure Date: 05/17/2018 1:05 PM MRN: [id number] Date of Birth: [birth year] Attending MD: Barney Drain MD, MD CSN: [id number] Age: 52 Admit Type: Outpatient Procedure:                Upper GI endoscopy WITH COLD FORCEPS BIOPSY Indications:              Anemia Providers:                Barney Drain MD, MD, Rosina Lowenstein, RN, Gerome Sam, RN, Nelma Rothman, Technician Referring MD:             Norwood Levo. Simpson MD, MD Medicines:                Propofol per Anesthesia Complications:            No immediate complications. Estimated Blood Loss:     Estimated blood loss was minimal. Procedure:                Pre-Anesthesia Assessment:                           - Prior to the procedure, a History and Physical                            was performed, and patient medications and                            allergies were reviewed. The patient's tolerance of                            previous anesthesia was also reviewed. The risks                            and benefits of the procedure and the sedation                            options and risks were discussed with the patient.                            All questions were answered, and informed consent                            was obtained. Prior Anticoagulants: The patient has                            taken naproxen, last dose was 7 days prior to                            procedure. ASA Grade Assessment: II - A patient                            with mild systemic disease. After reviewing the  risks and benefits, the patient was deemed in                            satisfactory condition to undergo the procedure.                            After obtaining informed consent, the endoscope was                            passed under direct vision. Throughout the                            procedure, the patient's blood pressure, pulse,  and                            oxygen saturations were monitored continuously. The                            [id number]-[id number] ([id number]) scope was introduced through the                            mouth, and advanced to the second part of duodenum.                            The upper GI endoscopy was accomplished without                            difficulty. The patient tolerated the procedure                            well. Scope In: 1:26:43 PM Scope Out: 1:35:56 PM Total Procedure Duration: 0 hours 9 minutes 13 seconds  Findings:      The examined esophagus was normal.      Patchy mild inflammation characterized by congestion (edema) and       erythema was found in the gastric body and in the gastric antrum.       Biopsies were taken with a cold forceps for Helicobacter pylori testing.      The duodenal bulb was normal. Biopsies for histology were taken with a       cold forceps for evaluation of celiac disease.      The second portion of the duodenum was normal. Biopsies for histology       were taken with a cold forceps for evaluation of celiac disease. Impression:               - MILD Gastritis/DUODENITIS. Biopsied. Moderate Sedation:      Per Anesthesia Care Recommendation:           - Patient has a contact number available for                            emergencies. The signs and symptoms of potential                            delayed complications were discussed with the  patient. Return to normal activities tomorrow.                            Written discharge instructions were provided to the                            patient.                           - High fiber diet.                           - Await pathology results.                           - Return to my office in 4 months.                           - Continue present medications. ADD OMEPRAZOLE                            DAILY. Procedure Code(s):        --- Professional ---                            [phone], Esophagogastroduodenoscopy, flexible,                            transoral; with biopsy, single or multiple Diagnosis Code(s):        --- Professional ---                           K29.70, Gastritis, unspecified, without bleeding                           D64.9, Anemia, unspecified CPT copyright 2018 American Medical Association. All rights reserved. The codes documented in this report are preliminary and upon coder review may  be revised to meet current compliance requirements. Barney Drain, MD Barney Drain MD, MD 05/17/2018 1:46:11 PM This report has been signed electronically. Number of Addenda: 0

## 2018-05-17 NOTE — Anesthesia Preprocedure Evaluation (Signed)
Anesthesia Evaluation  Patient identified by MRN, date of birth, ID band Patient awake    Reviewed: Allergy & Precautions, NPO status , Patient's Chart, lab work & pertinent test results  Airway Mallampati: II  TM Distance: >3 FB Neck ROM: Full    Dental no notable dental hx. (+) Partial Upper   Pulmonary shortness of breath,  States uses inhalers occ - last ~ 6 months prior    Pulmonary exam normal breath sounds clear to auscultation       Cardiovascular Exercise Tolerance: Good hypertension, Pt. on medications Normal cardiovascular examI Rhythm:Regular Rate:Normal     Neuro/Psych  Headaches, Anxiety negative psych ROS   GI/Hepatic Neg liver ROS, GERD  Medicated and Controlled,  Endo/Other  Morbid obesity  Renal/GU negative Renal ROS  negative genitourinary   Musculoskeletal  (+) Arthritis , Osteoarthritis,    Abdominal   Peds negative pediatric ROS (+)  Hematology negative hematology ROS (+) anemia ,   Anesthesia Other Findings   Reproductive/Obstetrics negative OB ROS                             Anesthesia Physical Anesthesia Plan  ASA: III  Anesthesia Plan: General   Post-op Pain Management:    Induction: Intravenous  PONV Risk Score and Plan:   Airway Management Planned: Nasal Cannula and Simple Face Mask  Additional Equipment:   Intra-op Plan:   Post-operative Plan:   Informed Consent: I have reviewed the patients History and Physical, chart, labs and discussed the procedure including the risks, benefits and alternatives for the proposed anesthesia with the patient or authorized representative who has indicated his/her understanding and acceptance.   Dental advisory given  Plan Discussed with: CRNA  Anesthesia Plan Comments:         Anesthesia Quick Evaluation

## 2018-05-17 NOTE — Transfer of Care (Signed)
Immediate Anesthesia Transfer of Care Note  Patient: Shannon Roth  Procedure(s) Performed: ESOPHAGOGASTRODUODENOSCOPY (EGD) WITH PROPOFOL (N/A ) GIVENS CAPSULE STUDY (N/A ) BIOPSY  Patient Location: PACU  Anesthesia Type:General  Level of Consciousness: awake and patient cooperative  Airway & Oxygen Therapy: Patient Spontanous Breathing  Post-op Assessment: Report given to RN and Post -op Vital signs reviewed and stable  Post vital signs: Reviewed and stable  Last Vitals:  Vitals Value Taken Time  BP    Temp    Pulse 59 05/17/2018  1:45 PM  Resp 23 05/17/2018  1:45 PM  SpO2 97 % 05/17/2018  1:45 PM  Vitals shown include unvalidated device data.  Last Pain:  Vitals:   05/17/18 1337  TempSrc:   PainSc: 0-No pain         Complications: No apparent anesthesia complications

## 2018-05-17 NOTE — Discharge Instructions (Signed)
You have gastritis & DUODENITIS DUE TO YOUR USING naproxen. I biopsied your stomach & DUODENUM.   DRINK WATER TO KEEP YOUR URINE LIGHT YELLOW.  FOLLOW A LOW FAT DIET. MEATS SHOULD BE BAKED, BROILED, OR BOILED. Avoid fried foods. SEE INFO BELOW.  To treat gastritis/duodenitis, START OMEPRAZOLE.  TAKE 30 MINUTES PRIOR TO YOUR FIRST MEAL.   YOUR BIOPSY RESULTS WILL BE BACK IN 5 BUSINESS DAYS.  FOLLOW UP in APR 2020.  UPPER ENDOSCOPY AFTER CARE Read the instructions outlined below and refer to this sheet in the next week. These discharge instructions provide you with general information on caring for yourself after you leave the hospital. While your treatment has been planned according to the most current medical practices available, unavoidable complications occasionally occur. If you have any problems or questions after discharge, call DR. Argusta Mcgann, [phone].  ACTIVITY  You may resume your regular activity, but move at a slower pace for the next 24 hours.   Take frequent rest periods for the next 24 hours.   Walking will help get rid of the air and reduce the bloated feeling in your belly (abdomen).   No driving for 24 hours (because of the medicine (anesthesia) used during the test).   You may shower.   Do not sign any important legal documents or operate any machinery for 24 hours (because of the anesthesia used during the test).    NUTRITION  Drink plenty of fluids.   You may resume your normal diet as instructed by your doctor.   Begin with a light meal and progress to your normal diet. Heavy or fried foods are harder to digest and may make you feel sick to your stomach (nauseated).   Avoid alcoholic beverages for 24 hours or as instructed.    MEDICATIONS  You may resume your normal medications.   WHAT YOU CAN EXPECT TODAY  Some feelings of bloating in the abdomen.   Passage of more gas than usual.    IF YOU HAD A BIOPSY TAKEN DURING THE UPPER  ENDOSCOPY:  Eat a soft diet IF YOU HAVE NAUSEA, BLOATING, ABDOMINAL PAIN, OR VOMITING.    FINDING OUT THE RESULTS OF YOUR TEST Not all test results are available during your visit. DR. Oneida Alar WILL CALL YOU WITHIN 7 DAYS OF YOUR PROCEDUE WITH YOUR RESULTS. Do not assume everything is normal if you have not heard from DR. Mazikeen Hehn IN ONE WEEK, CALL HER OFFICE AT [phone].  SEEK IMMEDIATE MEDICAL ATTENTION AND CALL THE OFFICE: [phone] IF:  You have more than a spotting of blood in your stool.   Your belly is swollen (abdominal distention).   You are nauseated or vomiting.   You have a temperature over 101F.   You have abdominal pain or discomfort that is severe or gets worse throughout the day.   Gastritis/DUODENITIS  Gastritis is an inflammation (the body's way of reacting to injury and/or infection) of the stomach. DUODENITIS is an inflammation (the body's way of reacting to injury and/or infection) of the FIRST PART OF THE SMALL INTESTINES. It is often caused by bacterial (germ) infections. It can also be caused BY ASPIRIN, BC/GOODY POWDER'S, (IBUPROFEN) MOTRIN, OR ALEVE (NAPROXEN), chemicals (including alcohol), SPICY FOODS, and medications. This illness may be associated with generalized malaise (feeling tired, not well), UPPER ABDOMINAL STOMACH cramps, and fever. One common bacterial cause of gastritis is an organism known as H. Pylori. This can be treated with antibiotics.       Low-Fat Diet BREADS,  CEREALS, PASTA, RICE, DRIED PEAS, AND BEANS These products are high in carbohydrates and most are low in fat. Therefore, they can be increased in the diet as substitutes for fatty foods. They too, however, contain calories and should not be eaten in excess. Cereals can be eaten for snacks as well as for breakfast.  Include foods that contain fiber (fruits, vegetables, whole grains, and legumes). Research shows that fiber may lower blood cholesterol levels, especially the  water-soluble fiber found in fruits, vegetables, oat products, and legumes. FRUITS AND VEGETABLES It is good to eat fruits and vegetables. Besides being sources of fiber, both are rich in vitamins and some minerals. They help you get the daily allowances of these nutrients. Fruits and vegetables can be used for snacks and desserts. MEATS Limit lean meat, chicken, [place], and fish to no more than 6 ounces per day. Beef, Pork, and Lamb Use lean cuts of beef, pork, and lamb. Lean cuts include:  Extra-lean ground beef.  Arm roast.  Sirloin tip.  Center-cut ham.  Round steak.  Loin chops.  Rump roast.  Tenderloin.  Trim all fat off the outside of meats before cooking. It is not necessary to severely decrease the intake of red meat, but lean choices should be made. Lean meat is rich in protein and contains a highly absorbable form of iron. Premenopausal women, in particular, should avoid reducing lean red meat because this could increase the risk for low red blood cells (iron-deficiency anemia).  Chicken and [place] These are good sources of protein. The fat of poultry can be reduced by removing the skin and underlying fat layers before cooking. Chicken and [place] can be substituted for lean red meat in the diet. Poultry should not be fried or covered with high-fat sauces. Fish and Shellfish Fish is a good source of protein. Shellfish contain cholesterol, but they usually are low in saturated fatty acids. The preparation of fish is important. Like chicken and [place], they should not be fried or covered with high-fat sauces. EGGS Egg whites contain no fat or cholesterol. They can be eaten often. Try 1 to 2 egg whites instead of whole eggs in recipes or use egg substitutes that do not contain yolk.  MILK AND DAIRY PRODUCTS Use skim or 1% milk instead of 2% or whole milk. Decrease whole milk, natural, and processed cheeses. Use nonfat or low-fat (2%) cottage cheese or low-fat cheeses made from  vegetable oils. Choose nonfat or low-fat (1 to 2%) yogurt. Experiment with evaporated skim milk in recipes that call for heavy cream. Substitute low-fat yogurt or low-fat cottage cheese for sour cream in dips and salad dressings. Have at least 2 servings of low-fat dairy products, such as 2 glasses of skim (or 1%) milk each day to help get your daily calcium intake.  FATS AND OILS Butterfat, lard, and beef fats are high in saturated fat and cholesterol. These should be avoided.Vegetable fats do not contain cholesterol. AVOID coconut oil, palm oil, and palm kernel oil, WHICH are very high in saturated fats. These should be limited. These fats are often used in bakery goods, processed foods, popcorn, oils, and nondairy creamers. Vegetable shortenings and some peanut butters contain hydrogenated oils, which are also saturated fats. Read the labels on these foods and check for saturated vegetable oils.  Desirable liquid vegetable oils are corn oil, cottonseed oil, olive oil, canola oil, safflower oil, soybean oil, and sunflower oil. Peanut oil is not as good, but small amounts are acceptable. Buy a  heart-healthy tub margarine that has no partially hydrogenated oils in the ingredients. AVOID Mayonnaise and salad dressings often are made from unsaturated fats.  OTHER EATING TIPS Snacks  Most sweets should be limited as snacks. They tend to be rich in calories and fats, and their caloric content outweighs their nutritional value. Some good choices in snacks are graham crackers, melba toast, soda crackers, bagels (no egg), English muffins, fruits, and vegetables. These snacks are preferable to snack crackers, [place] fries, and chips. Popcorn should be air-popped or cooked in small amounts of liquid vegetable oil.  Desserts Eat fruit, low-fat yogurt, and fruit ices instead of pastries, cake, and cookies. Sherbet, angel food cake, gelatin dessert, frozen low-fat yogurt, or other frozen products that do not contain  saturated fat (pure fruit juice bars, frozen ice pops) are also acceptable.    COOKING METHODS Choose those methods that use little or no fat. They include: Poaching.  Braising.  Steaming.  Grilling.  Baking.  Stir-frying.  Broiling.  Microwaving.  Foods can be cooked in a nonstick pan without added fat, or use a nonfat cooking spray in regular cookware. Limit fried foods and avoid frying in saturated fat. Add moisture to lean meats by using water, broth, cooking wines, and other nonfat or low-fat sauces along with the cooking methods mentioned above. Soups and stews should be chilled after cooking. The fat that forms on top after a few hours in the refrigerator should be skimmed off. When preparing meals, avoid using excess salt. Salt can contribute to raising blood pressure in some people.  EATING AWAY FROM HOME Order entres, potatoes, and vegetables without sauces or butter. When meat exceeds the size of a deck of cards (3 to 4 ounces), the rest can be taken home for another meal. Choose vegetable or fruit salads and ask for low-calorie salad dressings to be served on the side. Use dressings sparingly. Limit high-fat toppings, such as bacon, crumbled eggs, cheese, sunflower seeds, and olives. Ask for heart-healthy tub margarine instead of butter.

## 2018-05-17 NOTE — Anesthesia Postprocedure Evaluation (Signed)
Anesthesia Post Note  Patient: Shannon Roth  Procedure(s) Performed: ESOPHAGOGASTRODUODENOSCOPY (EGD) WITH PROPOFOL (N/A ) GIVENS CAPSULE STUDY (N/A ) BIOPSY  Patient location during evaluation: PACU Anesthesia Type: General Level of consciousness: awake and patient cooperative Pain management: pain level controlled Vital Signs Assessment: post-procedure vital signs reviewed and stable Respiratory status: spontaneous breathing, nonlabored ventilation and respiratory function stable Cardiovascular status: blood pressure returned to baseline Postop Assessment: no apparent nausea or vomiting Anesthetic complications: no     Last Vitals:  Vitals:   05/17/18 1243 05/17/18 1345  BP: 138/70 (!) 113/58  Pulse: (!) 53 (!) 59  Resp: 15 (!) 23  Temp: 36.8 C (P) 36.8 C  SpO2: 98% 97%    Last Pain:  Vitals:   05/17/18 1337  TempSrc:   PainSc: 0-No pain                 Shabree Tebbetts J

## 2018-05-23 ENCOUNTER — Other Ambulatory Visit: Payer: Self-pay

## 2018-05-23 ENCOUNTER — Encounter (HOSPITAL_COMMUNITY): Payer: Self-pay | Admitting: Gastroenterology

## 2018-05-23 ENCOUNTER — Telehealth: Payer: Self-pay | Admitting: Gastroenterology

## 2018-05-23 DIAGNOSIS — R7989 Other specified abnormal findings of blood chemistry: Secondary | ICD-10-CM

## 2018-05-23 NOTE — Telephone Encounter (Signed)
ON RECALL  °

## 2018-05-23 NOTE — Telephone Encounter (Signed)
Please call pt. HER stomach and small bowel biopsies shows gastritis/duodenitis due to  naproxen.    DRINK WATER TO KEEP YOUR URINE LIGHT YELLOW.  FOLLOW A LOW FAT DIET. MEATS SHOULD BE BAKED, BROILED, OR BOILED. Avoid fried foods.   To treat gastritis/duodenitis, START OMEPRAZOLE.  TAKE 30 MINUTES PRIOR TO YOUR FIRST MEAL.  RECHECK CBC/FERRITIN IN [facility] 2020.  FOLLOW UP in APR 2020.

## 2018-05-23 NOTE — Telephone Encounter (Signed)
Pt is aware and lab orders on file for 08/2018.

## 2018-06-28 ENCOUNTER — Telehealth: Payer: Self-pay | Admitting: Gastroenterology

## 2018-06-28 NOTE — Telephone Encounter (Signed)
Recall for cbc, ferritin

## 2018-06-29 ENCOUNTER — Other Ambulatory Visit: Payer: Self-pay

## 2018-06-29 DIAGNOSIS — R7989 Other specified abnormal findings of blood chemistry: Secondary | ICD-10-CM

## 2018-06-29 NOTE — Telephone Encounter (Signed)
Lab orders on file to be mailed for March 2020.

## 2018-07-14 ENCOUNTER — Encounter: Payer: Self-pay | Admitting: *Deleted

## 2018-07-21 ENCOUNTER — Telehealth: Payer: Self-pay | Admitting: Family Medicine

## 2018-07-21 NOTE — Telephone Encounter (Signed)
Spoke with patient, she is aware that she needs to come in and see Dr Moshe Cipro with labs to be completed, however she advised she needed to check on her schedule. She also advised that she would call for her mammogram. Pt states she will call us back.

## 2018-07-21 NOTE — Telephone Encounter (Signed)
-----   Message from Fayrene Helper, MD sent at 07/20/2018  7:00 PM EST ----- Regarding: pls call pt, no mammogram since 2017!!!, needs one pls Also, encourage/ needs appt in office , with fasting labs , last seen in 12/2017

## 2018-08-17 ENCOUNTER — Encounter: Payer: Self-pay | Admitting: Gastroenterology

## 2018-08-19 ENCOUNTER — Telehealth: Payer: Self-pay | Admitting: *Deleted

## 2018-08-19 NOTE — Telephone Encounter (Signed)
Pt called in stating the psychologist wanted her to check with her pcp about some medication for anxiety. Told her I could send a message as we close at 12 she stated nevermind she would call the psychologist back I offered to send a message to let Dr. Moshe Cipro know she said no thank you. Just FYI

## 2018-11-09 ENCOUNTER — Encounter: Payer: Self-pay | Admitting: Gastroenterology

## 2018-11-09 ENCOUNTER — Ambulatory Visit (INDEPENDENT_AMBULATORY_CARE_PROVIDER_SITE_OTHER): Payer: Commercial Managed Care - PPO | Admitting: Gastroenterology

## 2018-11-09 ENCOUNTER — Other Ambulatory Visit: Payer: Self-pay

## 2018-11-09 DIAGNOSIS — D509 Iron deficiency anemia, unspecified: Secondary | ICD-10-CM

## 2018-11-09 NOTE — Assessment & Plan Note (Signed)
NO BRBPR OR MELENA. LAST CBC MAR 2020.  SEND COPY OF LABS FROM [facility] AND I WILL REVIEW. CHECK CBC IN JUL 2020. FOLLOW UP IN 6 MOS.  CALL WITH QUESTIONS OR CONCERNS.

## 2018-11-09 NOTE — Progress Notes (Signed)
   Subjective:    Patient ID: Shannon Roth, female    DOB: [birth year], 53 y.o.   MRN: [id number]   Primary Care Physician:  Fayrene Helper, MD  Primary GI:  Barney Drain, MD   Patient Location: home   Provider Location: [facility] office   Reason for Visit: ANEMIA   Persons present on the virtual encounter, with roles: patient, myself (provider), MARTINA BOOTH CMA (update meds/allergies)   Total time (minutes) spent on medical discussion: 15 MINUTES   Due to COVID-19, visit was VIA TELEPHONE VISIT DUE TO COVID 19. VISIT IS CONDUCTED VIRTUALLY AND WAS REQUESTED BY PATIENT.   Virtual Visit via TELEPHONE   I connected with Shannon Roth and verified that I am speaking with the correct person using two identifiers.   I discussed the limitations, risks, security and privacy concerns of performing an evaluation and management service by telephone/video and the availability of in person appointments. I also discussed with the patient that there may be a patient responsible charge related to this service. The patient expressed understanding and agreed to proceed.  HPI No questions or concerns. Had blood drawn [facility] MAR 19. DIDN'T SAY ANYTHING ABOUT BLOOD COUNT BEING LOW. APPETITE: GOOD. BOWELS MOVING: 2-3X/DAY-NL.   PT DENIES FEVER, CHILLS, HEMATOCHEZIA, HEMATEMESIS, nausea, vomiting, melena, diarrhea, CHEST PAIN, SHORTNESS OF BREATH,  CHANGE IN BOWEL IN HABITS, constipation, abdominal pain, problems swallowing, OR heartburn or indigestion.  Past Medical History:  Diagnosis Date  . Arthritis   . Cough   . Migraine   . Migraine 2002  . Obesity   . Rectal bleeding    Past Surgical History:  Procedure Laterality Date  . bilateral BIOPSY BREAST  2002   benign  . Bilateral tubal ligation  2001  . BIOPSY  05/17/2018   Procedure: BIOPSY;  Surgeon: Danie Binder, MD;  [facility]: [facility];  Service: Endoscopy;;  duodenum and gastric  . CHOLECYSTECTOMY  2003  .  COLONOSCOPY N/A 06/21/2015   SLF: hyperplastic polyps removed. next TCS 10 years  . ESOPHAGOGASTRODUODENOSCOPY (EGD) WITH PROPOFOL N/A 05/17/2018   Procedure: ESOPHAGOGASTRODUODENOSCOPY (EGD) WITH PROPOFOL;  Surgeon: Danie Binder, MD;  [facility]: [facility];  Service: Endoscopy;  Laterality: N/A;  1:45pm  . EYE SURGERY     right cataract removal   . GIVENS CAPSULE STUDY N/A 05/17/2018   Procedure: GIVENS CAPSULE STUDY;  Surgeon: Danie Binder, MD;  [facility]: [facility];  Service: Endoscopy;  Laterality: N/A;  . TOTAL ABDOMINAL HYSTERECTOMY W/ BILATERAL SALPINGOOPHORECTOMY  2003  . TUBAL LIGATION     Allergies  Allergen Reactions  . Codeine Nausea And Vomiting  . Penicillins Rash   Current Outpatient Medications  Medication Sig    . Biotin 10 MG CAPS Take 10 mg by mouth daily.    . diclofenac sodium (VOLTAREN) 1 % GEL Apply 1 application topically daily.     Marland Kitchen ibuprofen (ADVIL) 800 MG tablet Take 800 mg by mouth every 8 (eight) hours as needed.    Marland Kitchen omeprazole (PRILOSEC) 20 MG capsule 1 PO 30 MINS PRIOR TO BREAKFAST.    . naproxen (NAPROSYN) 500 MG tablet Take 500 mg by mouth daily as needed for moderate pain.     Review of Systems PER HPI OTHERWISE ALL SYSTEMS ARE NEGATIVE.    Objective:   Physical Exam TELEPHONE VISIT DUE TO COVID 19, VISIT IS CONDUCTED VIRTUALLY AND WAS REQUESTED BY PATIENT.    Assessment & Plan:

## 2018-11-10 NOTE — Progress Notes (Signed)
ON RECALL  °

## 2018-11-10 NOTE — Progress Notes (Signed)
cc'ed to pcp °

## 2018-11-15 ENCOUNTER — Other Ambulatory Visit: Payer: Self-pay | Admitting: *Deleted

## 2018-11-15 ENCOUNTER — Encounter: Payer: Self-pay | Admitting: *Deleted

## 2018-11-15 DIAGNOSIS — D509 Iron deficiency anemia, unspecified: Secondary | ICD-10-CM

## 2019-01-05 ENCOUNTER — Telehealth: Payer: Self-pay | Admitting: Family Medicine

## 2019-01-05 NOTE — Telephone Encounter (Signed)
Shannon Roth called at 4:23 stating that she needed to come in tomorrow.  I told her that Dr. Moshe Cipro was not in the office tomorrow.  She said she has been sick for the last couple of days.  She said she is throwing up and having diarrhea.  I told her that I would have to send a message to the doctor and the clinical staff to see how she needs to proceed.  Can you call this patient and advise her what to do?    Thanks

## 2019-01-06 ENCOUNTER — Other Ambulatory Visit: Payer: Self-pay

## 2019-01-06 ENCOUNTER — Other Ambulatory Visit: Payer: Commercial Managed Care - PPO

## 2019-01-06 DIAGNOSIS — Z20822 Contact with and (suspected) exposure to covid-19: Secondary | ICD-10-CM

## 2019-01-08 LAB — NOVEL CORONAVIRUS, NAA: SARS-CoV-2, NAA: NOT DETECTED

## 2019-01-09 ENCOUNTER — Telehealth: Payer: Self-pay | Admitting: Family Medicine

## 2019-01-09 NOTE — Telephone Encounter (Signed)
Called pt 4 days later, left work sick las week Thursday with vomitingand diarrhea, always, cold, no fever. Was indirectly exposed to covid 19 on the job. Took covid test last week and has to remain out of work until she gets a negative result. Does not need a note to return to work, and again stated that she does not need anything from the office now, as when she needed [place] no one called her. I did let her know that the office was closed this past Friday I again apologized and also let her  know that I would ask my manager to speak with her

## 2019-01-10 NOTE — Telephone Encounter (Signed)
I spoke with patient on 01/09/2019

## 2019-04-17 ENCOUNTER — Encounter: Payer: Self-pay | Admitting: Gastroenterology

## 2019-04-27 ENCOUNTER — Ambulatory Visit: Payer: Commercial Managed Care - PPO | Admitting: Family Medicine

## 2019-05-24 ENCOUNTER — Encounter: Payer: Self-pay | Admitting: Gastroenterology

## 2019-05-24 ENCOUNTER — Other Ambulatory Visit: Payer: Self-pay

## 2019-05-24 ENCOUNTER — Ambulatory Visit (INDEPENDENT_AMBULATORY_CARE_PROVIDER_SITE_OTHER): Payer: Commercial Managed Care - PPO | Admitting: Gastroenterology

## 2019-05-24 DIAGNOSIS — K219 Gastro-esophageal reflux disease without esophagitis: Secondary | ICD-10-CM | POA: Diagnosis not present

## 2019-05-24 DIAGNOSIS — K760 Fatty (change of) liver, not elsewhere classified: Secondary | ICD-10-CM | POA: Diagnosis not present

## 2019-05-24 DIAGNOSIS — D509 Iron deficiency anemia, unspecified: Secondary | ICD-10-CM

## 2019-05-24 NOTE — Assessment & Plan Note (Signed)
SYMPTOMS CONTROLLED/RESOLVED.  CONTINUE TO MONITOR SYMPTOMS. FOLLOW UP IN 1 YEAR.  

## 2019-05-24 NOTE — Assessment & Plan Note (Addendum)
WEIGHT STABLE.  CHECK LIVER PANEL ONCE A YEAR. SEE [facility]. EAT TO LIVE AND THINK OF FOOD AS MEDICINE. 75% OF YOUR PLATE SHOULD BE FRUITS/VEGGIES.  To have more energy, and to lose weight:      1. CONTINUE YOUR WEIGHT LOSS EFFORTS. I RECOMMEND YOU READ AND FOLLOW RECOMMENDATIONS BY DR. MARK HYMAN, "10-DAY DETOX DIET".    2. If you must eat bread, EAT EZEKIEL BREAD. IT IS IN THE FROZEN SECTION OF THE GROCERY STORE.    3. DRINK WATER WITH FRUIT OR CUCUMBER ADDED. YOUR URINE SHOULD BE LIGHT YELLOW. AVOID SODA, GATORADE, ENERGY DRINKS, OR DIET SODA.     4. AVOID HIGH FRUCTOSE CORN SYRUP AND CAFFEINE.     5. DO NOT chew SUGAR FREE GUM OR USE ARTIFICIAL SWEETENERS. IF NEEDED USE STEVIA AS A SWEETENER.    6. DO NOT EAT ENRICHED WHEAT FLOUR, PASTA, RICE, OR CEREAL.    7. ONLY EAT WILD CAUGHT SEAFOOD, GRASS FED BEEF OR CHICKEN, PORK FROM PASTURE RAISE PIGS, OR EGGS FROM PASTURE RAISED CHICKENS.    8. PRACTICE CHAIR YOGA FOR 15-30 MINS 3 OR 4 TIMES A WEEK AND PROGRESS TO HATHA YOGA OVER NEXT 6 MOS.    9. START TAKING A MULTIVITAMIN, AND VITAMIN B12.   10. CONTINUE VITAMIN D3 DAILY.   11. ADD ZINC DAILY TO BOOST YOUR IMMUNE SYSTEM.    ADDITIONAL SUPPLEMENTS TO DECREASE CRAVING AND SUPPRESS YOUR APPETITE:    1. CINNAMON 500 MG EVERY AM PRIOR TO FIRST MEAL.   **STABILIZES BLOOD GLUCOSE/REDUCES CRAVINGS**    2. CHROMIUM 400-600 MG WITH MEALS TWICE DAILY.    **FAT BURNER**    3. GREEN TEA EXTRACT ONE DAILY.   **FAT BURNER/SUPPRESSES YOUR APPETITE**    4. ALPHA LIPOIC ACID 250 MG TWICE DAILY.   **NATURAL ANTI-INFLAMMATORY SUPPLEMENT THAT IS AN ALTERNATIVE TO IBUPROFEN OR NAPROXEN**

## 2019-05-24 NOTE — Progress Notes (Signed)
Cc'ed to pcp °

## 2019-05-24 NOTE — Patient Instructions (Addendum)
CALL DR.CORUM FOR A NEW PATIENT APPT. HER ADDRESS IS [address] [place], [place] [address], Phone: [phone].  COMPLETE BLOOD DRAW WITHIN 7 DAYS.  DO NOT TAKE MOBIC, NAPROXEN, AND IBU[PROFEN IN SAME DAY. IT CAN CAUSE GI BLEEDING AND KIDNEY FAILURE.  FOLLOW UP IN 1 YEAR.    EAT TO LIVE AND THINK OF FOOD AS MEDICINE. 75% OF YOUR PLATE SHOULD BE FRUITS/VEGGIES.  To have more energy, and to lose weight:      1. CONTINUE YOUR WEIGHT LOSS EFFORTS. I RECOMMEND YOU READ AND FOLLOW RECOMMENDATIONS BY DR. MARK HYMAN, "10-DAY DETOX DIET".    2. If you must eat bread, EAT EZEKIEL BREAD. IT IS IN THE FROZEN SECTION OF THE GROCERY STORE.    3. DRINK WATER WITH FRUIT OR CUCUMBER ADDED. YOUR URINE SHOULD BE LIGHT YELLOW. AVOID SODA, GATORADE, ENERGY DRINKS, OR DIET SODA.     4. AVOID HIGH FRUCTOSE CORN SYRUP AND CAFFEINE.     5. DO NOT chew SUGAR FREE GUM OR USE ARTIFICIAL SWEETENERS. IF NEEDED USE STEVIA AS A SWEETENER.    6. DO NOT EAT ENRICHED WHEAT FLOUR, PASTA, RICE, OR CEREAL.    7. ONLY EAT WILD CAUGHT SEAFOOD, GRASS FED BEEF OR CHICKEN, PORK FROM PASTURE RAISE PIGS, OR EGGS FROM PASTURE RAISED CHICKENS.    8. PRACTICE CHAIR YOGA FOR 15-30 MINS 3 OR 4 TIMES A WEEK AND PROGRESS TO HATHA YOGA OVER NEXT 6 MOS.    9. START TAKING A MULTIVITAMIN, AND VITAMIN B12.   10. CONTINUE VITAMIN D3 DAILY.   11. ADD ZINC DAILY TO BOOST YOUR IMMUNE SYSTEM.    ADDITIONAL SUPPLEMENTS TO DECREASE CRAVING AND SUPPRESS YOUR APPETITE:    1. CINNAMON 500 MG EVERY AM PRIOR TO FIRST MEAL.   **STABILIZES BLOOD GLUCOSE/REDUCES CRAVINGS**    2. CHROMIUM 400-600 MG WITH MEALS TWICE DAILY.    **FAT BURNER**    3. GREEN TEA EXTRACT ONE DAILY.   **FAT BURNER/SUPPRESSES YOUR APPETITE**    4. ALPHA LIPOIC ACID 250 MG TWICE DAILY.   **NATURAL ANTI-INFLAMMATORY SUPPLEMENT THAT IS AN ALTERNATIVE TO IBUPROFEN OR NAPROXEN**

## 2019-05-24 NOTE — Progress Notes (Signed)
ON RECALL  °

## 2019-05-24 NOTE — Assessment & Plan Note (Signed)
NO BRBPR OR MELENA.  CHECK CBC/FERRITIN. FOLLOW UP IN 1 YEAR.

## 2019-05-24 NOTE — Progress Notes (Signed)
Subjective:    Patient ID: Shannon Roth, female    DOB: 28-Nov-1965, 53 y.o.   MRN: [id number]   HPI  LAST BLOOD DRAW MAR 2020. UPSET BECAUSE DR. Moshe Cipro SAID SHE HAD AIDS. BMs: DRINKS SMOOTHIES AND EATS FRUIT. HAS BEEN WORKING ON WEIGHT LOSS. 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  PT DENIES FEVER, CHILLS, HEMATOCHEZIA, HEMATEMESIS, nausea, vomiting, melena, diarrhea, CHEST PAIN, SHORTNESS OF BREATH,  CHANGE IN BOWEL IN HABITS, constipation, abdominal pain, problems swallowing, problems with sedation, heartburn or indigestion.  Past Medical History:  Diagnosis Date  . Arthritis   . Cough   . Migraine   . Migraine 2002  . Obesity   . Rectal bleeding    Past Surgical History:  Procedure Laterality Date  . bilateral BIOPSY BREAST  2002   benign  . Bilateral tubal ligation  2001  . BIOPSY  05/17/2018   Procedure: BIOPSY;  Surgeon: Danie Binder, MD;  [facility];  Service: Endoscopy;;  duodenum and gastric  . CHOLECYSTECTOMY  2003  . COLONOSCOPY N/A 06/21/2015   SLF: hyperplastic polyps removed. next TCS 10 years  . ESOPHAGOGASTRODUODENOSCOPY (EGD) WITH PROPOFOL N/A 05/17/2018   Procedure: ESOPHAGOGASTRODUODENOSCOPY (EGD) WITH PROPOFOL;  Surgeon: Danie Binder, MD;  [facility];  Service: Endoscopy;  Laterality: N/A;  1:45pm  . EYE SURGERY     right cataract removal   . GIVENS CAPSULE STUDY N/A 05/17/2018   Procedure: GIVENS CAPSULE STUDY;  Surgeon: Danie Binder, MD;  [facility];  Service: Endoscopy;  Laterality: N/A;  . TOTAL ABDOMINAL HYSTERECTOMY W/ BILATERAL SALPINGOOPHORECTOMY  2003  . TUBAL LIGATION     Allergies  Allergen Reactions  . Codeine Nausea And Vomiting  . Penicillins Rash   Current Outpatient Medications  Medication Sig    . Biotin 10 MG CAPS Take 10 mg by mouth daily.    Marland Kitchen BLACK CURRANT SEED OIL PO Take by mouth daily.    . Cholecalciferol (VITAMIN D) 125 MCG (5000 UT) CAPS Take by mouth daily.    .  COLLAGEN PO Take by mouth daily.    . diclofenac sodium (VOLTAREN) 1 % GEL Apply 1 application topically as needed.     . Flaxseed, Linseed, (FLAX SEEDS) POWD Take by mouth daily.    Marland Kitchen ibuprofen (ADVIL) 800 MG tablet Take 800 mg by mouth every 8 (eight) hours as needed.    . meloxicam (MOBIC) 7.5 MG tablet Take 7.5 mg by mouth as needed.    Marland Kitchen omeprazole (PRILOSEC) 20 MG capsule 1 PO 30 MINS PRIOR TO BREAKFAST.    Marland Kitchen OVER THE COUNTER MEDICATION CBD oil if needed    . naproxen (NAPROSYN) 500 MG tablet Take 500 mg by mouth daily as needed for moderate pain.     Review of Systems PER HPI OTHERWISE ALL SYSTEMS ARE NEGATIVE.    Objective:   Physical Exam Constitutional:      General: She is not in acute distress.    Appearance: Normal appearance.  HENT:     Mouth/Throat:     Comments: MASK IN PLACE Eyes:     General: No scleral icterus.    Pupils: Pupils are equal, round, and reactive to light.  Cardiovascular:     Rate and Rhythm: Normal rate and regular rhythm.     Pulses: Normal pulses.     Heart sounds: Normal heart sounds.  Pulmonary:     Effort: Pulmonary effort is normal.  Breath sounds: Normal breath sounds.  Abdominal:     General: Bowel sounds are normal.     Palpations: Abdomen is soft.     Tenderness: There is no abdominal tenderness.  Musculoskeletal:     Cervical back: Normal range of motion.     Right lower leg: No edema.     Left lower leg: No edema.  Lymphadenopathy:     Cervical: No cervical adenopathy.  Skin:    General: Skin is warm and dry.  Neurological:     Mental Status: She is alert and oriented to person, place, and time.     Comments: NO  NEW FOCAL DEFICITS  Psychiatric:        Mood and Affect: Mood normal.     Comments: NORMAL AFFECT       Assessment & Plan:

## 2019-05-29 ENCOUNTER — Telehealth: Payer: Self-pay | Admitting: *Deleted

## 2019-05-29 NOTE — Telephone Encounter (Signed)
Lab results received and placed in [facility] office for review

## 2019-06-06 NOTE — Telephone Encounter (Signed)
Results faxed to endo. FYI to Dr. Oneida Alar

## 2019-06-06 NOTE — Telephone Encounter (Signed)
REVIEWED-FAX LABS TO ENDO.

## 2019-06-06 NOTE — Telephone Encounter (Signed)
PLEASE CALL PT. HER BLOOD COUNT AND IRON STORES ARE NORMAL. HER LIVER PANEL IS NORMAL.

## 2019-06-07 NOTE — Telephone Encounter (Signed)
Spoke with pt. Pt notified of results.  

## 2019-06-20 ENCOUNTER — Other Ambulatory Visit (HOSPITAL_COMMUNITY)
Admission: RE | Admit: 2019-06-20 | Discharge: 2019-06-20 | Disposition: A | Discharge status: Home / Self Care | Payer: Commercial Managed Care - PPO | Source: Other Acute Inpatient Hospital | Attending: Family Medicine | Admitting: Family Medicine

## 2019-06-20 ENCOUNTER — Other Ambulatory Visit: Payer: Self-pay

## 2019-06-20 ENCOUNTER — Ambulatory Visit (INDEPENDENT_AMBULATORY_CARE_PROVIDER_SITE_OTHER): Payer: Commercial Managed Care - PPO | Admitting: Family Medicine

## 2019-06-20 VITALS — BP 130/82 | HR 72 | Temp 98.3°F | Resp 15 | Ht 68.0 in | Wt 335.0 lb

## 2019-06-20 DIAGNOSIS — M5410 Radiculopathy, site unspecified: Secondary | ICD-10-CM | POA: Diagnosis not present

## 2019-06-20 DIAGNOSIS — Z1231 Encounter for screening mammogram for malignant neoplasm of breast: Secondary | ICD-10-CM

## 2019-06-20 DIAGNOSIS — N3001 Acute cystitis with hematuria: Secondary | ICD-10-CM

## 2019-06-20 LAB — POCT URINALYSIS DIP (CLINITEK)
Bilirubin, UA: NEGATIVE
Glucose, UA: NEGATIVE mg/dL
Ketones, POC UA: NEGATIVE mg/dL
Leukocytes, UA: NEGATIVE
Nitrite, UA: NEGATIVE
POC PROTEIN,UA: NEGATIVE
Spec Grav, UA: 1.025 (ref 1.010–1.025)
Urobilinogen, UA: 0.2 E.U./dL
pH, UA: 7 (ref 5.0–8.0)

## 2019-06-20 MED ORDER — OMEPRAZOLE 20 MG PO CPDR: 20.0000 mg | DELAYED_RELEASE_CAPSULE | Freq: Every day | ORAL | 0 refills | Status: DC

## 2019-06-20 MED ORDER — METHYLPREDNISOLONE ACETATE 80 MG/ML IJ SUSP
120.0000 mg | Freq: Once | INTRAMUSCULAR | Status: AC
  Administered 2019-06-20: 12:00:00 120 mg via INTRAMUSCULAR

## 2019-06-20 MED ORDER — PHENTERMINE HCL 37.5 MG PO TABS: ORAL_TABLET | ORAL | 2 refills | Status: DC

## 2019-06-20 MED ORDER — PREDNISONE 10 MG PO TABS: 10.0000 mg | ORAL_TABLET | Freq: Two times a day (BID) | ORAL | 0 refills | Status: DC

## 2019-06-20 MED ORDER — IBUPROFEN 800 MG PO TABS: 800.0000 mg | ORAL_TABLET | Freq: Three times a day (TID) | ORAL | 0 refills | Status: DC

## 2019-06-20 MED ORDER — KETOROLAC TROMETHAMINE 60 MG/2ML IM SOLN
60.0000 mg | Freq: Once | INTRAMUSCULAR | Status: AC
  Administered 2019-06-20: 60 mg via INTRAMUSCULAR

## 2019-06-20 MED ORDER — METHYLPREDNISOLONE ACETATE 80 MG/ML IJ SUSP: 80.0000 mg | Freq: Once | INTRAMUSCULAR | Status: DC

## 2019-06-20 NOTE — Assessment & Plan Note (Signed)
Uncontrolled.Toradol and depo medrol administered IM in the office , to be followed by a short course of oral prednisone and NSAIDS.  

## 2019-06-20 NOTE — Patient Instructions (Addendum)
F/u in office with MD in 8 weeks , call if you need me before  Please schedule mammogram at breast center You are treated for acute sciatic pain, ibuprofen, prednisone and omeprazole are prescribed short term  You will get 2000 cal diet and info re bariatric surgery and numbner to call for appt  Please send in most recent labs  New is half phentermine daily to help with weight loss  Thanks for choosing [facility], we consider it a privelige to serve you.

## 2019-06-21 ENCOUNTER — Encounter: Payer: Self-pay | Admitting: Family Medicine

## 2019-06-21 ENCOUNTER — Other Ambulatory Visit (HOSPITAL_COMMUNITY)
Admission: RE | Admit: 2019-06-21 | Discharge: 2019-06-21 | Disposition: A | Discharge status: Home / Self Care | Payer: Commercial Managed Care - PPO | Source: Other Acute Inpatient Hospital | Attending: Family Medicine | Admitting: Family Medicine

## 2019-06-21 DIAGNOSIS — N3001 Acute cystitis with hematuria: Secondary | ICD-10-CM | POA: Insufficient documentation

## 2019-06-21 LAB — URINALYSIS, ROUTINE W REFLEX MICROSCOPIC
Bilirubin Urine: NEGATIVE
Glucose, UA: NEGATIVE mg/dL
Ketones, ur: NEGATIVE mg/dL
Leukocytes,Ua: NEGATIVE
Nitrite: NEGATIVE
Protein, ur: NEGATIVE mg/dL
Specific Gravity, Urine: 1.016 (ref 1.005–1.030)
pH: 5 (ref 5.0–8.0)

## 2019-06-21 LAB — URINE CULTURE: Culture: <10000 — AB

## 2019-06-22 ENCOUNTER — Encounter: Payer: Self-pay | Admitting: Family Medicine

## 2019-06-23 ENCOUNTER — Encounter: Payer: Self-pay | Admitting: Family Medicine

## 2019-06-24 ENCOUNTER — Encounter: Payer: Self-pay | Admitting: Family Medicine

## 2019-06-24 DIAGNOSIS — N3001 Acute cystitis with hematuria: Secondary | ICD-10-CM | POA: Insufficient documentation

## 2019-06-24 NOTE — Progress Notes (Signed)
   Shannon Roth     MRN: [id number]      DOB: 02-22-1966   HPI Shannon Roth is here for follow up and re-evaluation of chronic medical conditions, medication management and review of any available recent lab and radiology data.  Preventive health is updated, specifically  Cancer screening and Immunization.   2 week h/o back pain radiating down left leg, denies weakness, numbness or new incontinence Trying to lose weight , and interested in help  ROS Denies recent fever or chills. Denies sinus pressure, nasal congestion, ear pain or sore throat. Denies chest congestion, productive cough or wheezing. Denies chest pains, palpitations and leg swelling Denies abdominal pain, nausea, vomiting,diarrhea or constipation.   Denies dysuria, mild frequency.  Denies headaches, seizures, numbness, or tingling. Denies depression, anxiety or insomnia. Denies skin break down or rash.   PE  BP 130/82   Pulse 72   Temp 98.3 F (36.8 C) (Temporal)   Resp 15   Ht 5\' 8"  (1.727 m)   Wt (!) 335 lb (152 kg) Comment: unable to weigh  SpO2 99%   BMI 50.94 kg/m    Patient alert and oriented and in no cardiopulmonary distress.  HEENT: No facial asymmetry, EOMI,     Neck supple .  Chest: Clear to auscultation bilaterally.  CVS: S1, S2 no murmurs, no S3.Regular rate.  ABD: Soft non tender.   Ext: No edema  MS: Adequate ROM spine, shoulders, hips and knees.  Skin: Intact, no ulcerations or rash noted.  Psych: Good eye contact, normal affect. Memory intact not anxious or depressed appearing.  CNS: CN 2-12 intact, power,  normal throughout.no focal deficits noted.   Assessment & Plan  Back pain with left-sided radiculopathy Uncontrolled.Toradol and depo medrol administered IM in the office , to be followed by a short course of oral prednisone and NSAIDS.   Morbid obesity  Patient re-educated about  the importance of commitment to a  minimum of 150 minutes of exercise per week as  able.  The importance of healthy food choices with portion control discussed, as well as eating regularly and within a 12 hour window most days. The need to choose "clean , green" food 50 to 75% of the time is discussed, as well as to make water the primary drink and set a goal of 64 ounces water daily.    Weight /BMI 06/20/2019 05/24/2019 03/16/2018  WEIGHT - 335 lb 6.4 oz 312 lb 6.4 oz  HEIGHT - 5\' 8"  5\' 8"   BMI - 51 kg/m2 47.5 kg/m2    Start half phentermine daily  Acute cystitis with hematuria Abnormal UA in office with mild symptoms, send for additional testing

## 2019-06-24 NOTE — Assessment & Plan Note (Signed)
  Patient re-educated about  the importance of commitment to a  minimum of 150 minutes of exercise per week as able.  The importance of healthy food choices with portion control discussed, as well as eating regularly and within a 12 hour window most days. The need to choose "clean , green" food 50 to 75% of the time is discussed, as well as to make water the primary drink and set a goal of 64 ounces water daily.    Weight /BMI 06/20/2019 05/24/2019 03/16/2018  WEIGHT - 335 lb 6.4 oz 312 lb 6.4 oz  HEIGHT - 5\' 8"  5\' 8"   BMI - 51 kg/m2 47.5 kg/m2    Start half phentermine daily

## 2019-06-24 NOTE — Assessment & Plan Note (Signed)
Abnormal UA in office with mild symptoms, send for additional testing

## 2019-06-30 ENCOUNTER — Ambulatory Visit (HOSPITAL_COMMUNITY): Payer: Commercial Managed Care - PPO

## 2019-07-31 ENCOUNTER — Telehealth: Payer: Self-pay

## 2019-07-31 NOTE — Telephone Encounter (Signed)
Lymphedema has to do with swelling , c/o burning, sounds like may be from piched nerve in back, pls ask if she does have a dx of that, I see no record of images in her chart but think she had that checjed elsewhere. Pls let me know response so I can move forward

## 2019-07-31 NOTE — Telephone Encounter (Signed)
Was here in Jan. Does she need a new appt to evaluate?

## 2019-07-31 NOTE — Telephone Encounter (Signed)
Inside of thighs are burning, wants a referral to a [facility] Dr

## 2019-08-01 ENCOUNTER — Other Ambulatory Visit: Payer: Self-pay | Admitting: Family Medicine

## 2019-08-01 DIAGNOSIS — R1030 Lower abdominal pain, unspecified: Secondary | ICD-10-CM

## 2019-08-01 DIAGNOSIS — M5440 Lumbago with sciatica, unspecified side: Secondary | ICD-10-CM

## 2019-08-01 DIAGNOSIS — G8929 Other chronic pain: Secondary | ICD-10-CM

## 2019-08-01 NOTE — Telephone Encounter (Signed)
Referral id s entered , pls provide referral staff with mRI report to go ahead and get appt scheduled and let pt know, thanks

## 2019-08-01 NOTE — Telephone Encounter (Signed)
At recent visit got injections for the back pain, her symptoms of back problems persist, I recommend obtaining report of her MRI lumbar spine if in past 2 to 3 years, and refer either for epidural or for spine surgery evaluation, pls let me know what she wants

## 2019-08-01 NOTE — Telephone Encounter (Signed)
Called patient and left message for them to return call at the office   

## 2019-08-01 NOTE — Telephone Encounter (Signed)
Her last MRI is in care everywhere at [facility] in 2020. Doesn't want epidural. Wants referral to spine dr of your choice.

## 2019-08-01 NOTE — Telephone Encounter (Signed)
States she has been diagnosed in the past with back issues and the stingingburning pain is bilateral on the inner thighs

## 2019-08-02 NOTE — Telephone Encounter (Signed)
If they need the reports I will send them, I have sent the referral off

## 2019-08-02 NOTE — Telephone Encounter (Signed)
Her MRI report is in care everywhere. I can't print it off but can show you how to locate it if you need it for referral

## 2019-08-09 ENCOUNTER — Other Ambulatory Visit: Payer: Self-pay

## 2019-08-09 ENCOUNTER — Ambulatory Visit (INDEPENDENT_AMBULATORY_CARE_PROVIDER_SITE_OTHER): Payer: Commercial Managed Care - PPO | Admitting: Family Medicine

## 2019-08-09 VITALS — BP 114/78 | HR 63 | Temp 97.4°F | Resp 15 | Ht 68.0 in | Wt 341.0 lb

## 2019-08-09 DIAGNOSIS — M5410 Radiculopathy, site unspecified: Secondary | ICD-10-CM

## 2019-08-09 MED ORDER — CYCLOBENZAPRINE HCL 10 MG PO TABS: ORAL_TABLET | ORAL | 3 refills | Status: DC

## 2019-08-09 NOTE — Patient Instructions (Signed)
F/U in 4 months, call if you need me before  You will be referred to Dr Ace Gins re chronic pain management  New at bedtime for pain and spasm is flexeril   Think about what you will eat, plan ahead. Choose " clean, green, fresh or frozen" over canned, processed or packaged foods which are more sugary, salty and fatty. 70 to 75% of food eaten should be vegetables and fruit. Three meals at set times with snacks allowed between meals, but they must be fruit or vegetables. Aim to eat over a 12 hour period , example 7 am to 7 pm, and STOP after  your last meal of the day. Drink water,generally about 64 ounces per day, no other drink is as healthy. Fruit juice is best enjoyed in a healthy way, by EATING the fruit.   Looking for your blood work soon!

## 2019-08-12 ENCOUNTER — Encounter: Payer: Self-pay | Admitting: Family Medicine

## 2019-08-12 NOTE — Progress Notes (Signed)
   NOELENE RINES     MRN: [id number]      DOB: 1965-10-28   HPI Ms. Shannon Roth is here for follow up and re-evaluation of chronic medical conditions, medication management and review of any available recent lab and radiology data.  Pt was seen yesterday by Neurosurgery, again surgery not recommended, she is discouraged and again has questions re lymphedema being a cause of inner thighs rubbing together.  Also c/o dragging and weakness in leg She as not had requested labs as yet Sytates has not started phentermine   ROS Denies recent fever or chills. Denies sinus pressure, nasal congestion, ear pain or sore throat. Denies chest congestion, productive cough or wheezing. Denies chest pains, palpitations and leg swelling Denies abdominal pain, nausea, vomiting,diarrhea or constipation.   Denies dysuria, frequency, hesitancy or incontinence. . Denies depression, anxiety c/o insomnia.due to pain and spasm of back and legs Denies skin break down or rash.   PE  BP 114/78   Pulse 63   Temp (!) 97.4 F (36.3 C) (Temporal)   Resp 15   Ht 5\' 8"  (1.727 m)   Wt (!) 341 lb (154.7 kg)   SpO2 98%   BMI 51.85 kg/m   Patient alert and oriented and in no cardiopulmonary distress.  HEENT: No facial asymmetry, EOMI,     Neck supple .  Chest: Clear to auscultation bilaterally.  CVS: S1, S2 no murmurs, no S3.Regular rate.  .   Ext: No edema  MS: decreased ROM spine, , hips and knees.  Skin: Intact, no ulcerations or rash noted.Excess adipose tissue on inner thighs , in areas of concern re lymphedema  Psych: Good eye contact, normal affect. Memory intact not anxious or depressed appearing.  CNS: CN 2-12 intact, decreased LLE power, grade 4, sensation normal throughout  Assessment & Plan  Back pain with left-sided radiculopathy Worsening , chronic low back pain , has had 2 Neurosurgical consults and surgery not recommended. Refer to pain management , review of possible med options at  visit yields no option that I can recommend Start bedtime flexeril for spasm , as needed  Morbid obesity  Patient re-educated about  the importance of commitment to a  minimum of 150 minutes of exercise per week as able.  The importance of healthy food choices with portion control discussed, as well as eating regularly and within a 12 hour window most days. The need to choose "clean , green" food 50 to 75% of the time is discussed, as well as to make water the primary drink and set a goal of 64 ounces water daily.    Weight /BMI 08/09/2019 06/20/2019 05/24/2019  WEIGHT 341 lb 335 lb 335 lb 6.4 oz  HEIGHT 5\' 8"  5\' 8"  5\' 8"   BMI 51.85 kg/m2 50.94 kg/m2 51 kg/m2  pt to start phentermine and lifestyle change

## 2019-08-12 NOTE — Assessment & Plan Note (Signed)
Worsening , chronic low back pain , has had 2 Neurosurgical consults and surgery not recommended. Refer to pain management , review of possible med options at visit yields no option that I can recommend Start bedtime flexeril for spasm , as needed

## 2019-08-12 NOTE — Assessment & Plan Note (Signed)
  Patient re-educated about  the importance of commitment to a  minimum of 150 minutes of exercise per week as able.  The importance of healthy food choices with portion control discussed, as well as eating regularly and within a 12 hour window most days. The need to choose "clean , green" food 50 to 75% of the time is discussed, as well as to make water the primary drink and set a goal of 64 ounces water daily.    Weight /BMI 08/09/2019 06/20/2019 05/24/2019  WEIGHT 341 lb 335 lb 335 lb 6.4 oz  HEIGHT 5\' 8"  5\' 8"  5\' 8"   BMI 51.85 kg/m2 50.94 kg/m2 51 kg/m2  pt to start phentermine and lifestyle change

## 2019-08-16 ENCOUNTER — Ambulatory Visit: Payer: Commercial Managed Care - PPO | Admitting: Family Medicine

## 2019-09-04 ENCOUNTER — Ambulatory Visit: Payer: Commercial Managed Care - PPO

## 2019-09-06 ENCOUNTER — Other Ambulatory Visit: Payer: Self-pay

## 2019-09-06 ENCOUNTER — Encounter: Payer: Self-pay | Admitting: Gastroenterology

## 2019-09-06 ENCOUNTER — Ambulatory Visit (INDEPENDENT_AMBULATORY_CARE_PROVIDER_SITE_OTHER): Payer: Commercial Managed Care - PPO | Admitting: Gastroenterology

## 2019-09-06 DIAGNOSIS — K219 Gastro-esophageal reflux disease without esophagitis: Secondary | ICD-10-CM | POA: Diagnosis not present

## 2019-09-06 DIAGNOSIS — K589 Irritable bowel syndrome without diarrhea: Secondary | ICD-10-CM | POA: Insufficient documentation

## 2019-09-06 MED ORDER — OMEPRAZOLE 20 MG PO CPDR: 20.0000 mg | DELAYED_RELEASE_CAPSULE | Freq: Every day | ORAL | 3 refills | Status: DC

## 2019-09-06 MED ORDER — DICYCLOMINE HCL 10 MG PO CAPS: ORAL_CAPSULE | ORAL | 11 refills | Status: DC

## 2019-09-06 NOTE — Assessment & Plan Note (Signed)
SYMPTOMS FAIRLY WELL CONTROLLED. DOESN'T LIKE HAVING BMs AT WORK.  DRINK WATER TO KEEP YOUR URINE LIGHT YELLOW. Eat fiber. USE BENTYL WHEN NEEDED TO SLOW DOWN BOWELS FOR WORK. IT MAY CAUSE DROWSINESS, DRY EYES/MOUTH, BLURRY VISION, OR DIFFICULTY URINATING. FOLLOW UP IN 1 YEAR.

## 2019-09-06 NOTE — Progress Notes (Signed)
Subjective:    Patient ID: Shannon Roth, female    DOB: 09/01/1965, 54 y.o.   MRN: [id number]  Fayrene Helper, MD  HPI HAS IBS. IF SHE EATS SHE'S GOT TO GO. DOESN'T LIKE TO GO THE BATHROOM AT WORK [facility]. HAVING BSC #3-4.MAY EAT MAC N CHEESE FOR BREAKFAST. TAKES VITAMINS IN THE MAC N CHEESE OR OATMEAL. TRIED TO TAKE IMODIUM TO STOP IT. HAS ALWAYS BE OPPOSED TO HAVING A BM IN PUBLIC. TRIED IMODIUM AND IT DIDN'T STOP THE BMs. COVID SHOTS x2. IF EATS REAL HOT(TEMP), & FRUIT AT NIGHT. CAN HAVE ABDOMINAL IF YOUR STOMACH IS FEELING UPSET.   PT DENIES FEVER, CHILLS, HEMATOCHEZIA, HEMATEMESIS, nausea, vomiting, melena, diarrhea, CHEST PAIN, SHORTNESS OF BREATH, CHANGE IN BOWEL IN HABITS, constipation,  problems swallowing, OR heartburn or indigestion.  Past Medical History:  Diagnosis Date  . Arthritis   . Cough   . Migraine   . Migraine 2002  . Obesity   . Rectal bleeding    Past Surgical History:  Procedure Laterality Date  . bilateral BIOPSY BREAST  2002   benign  . Bilateral tubal ligation  2001  . BIOPSY  05/17/2018   Procedure: BIOPSY;  Surgeon: Danie Binder, MD;  [facility];  Service: Endoscopy;;  duodenum and gastric  . CHOLECYSTECTOMY  2003  . COLONOSCOPY N/A 06/21/2015   SLF: hyperplastic polyps removed. next TCS 10 years  . ESOPHAGOGASTRODUODENOSCOPY (EGD) WITH PROPOFOL N/A 05/17/2018   Procedure: ESOPHAGOGASTRODUODENOSCOPY (EGD) WITH PROPOFOL;  Surgeon: Danie Binder, MD;  [facility];  Service: Endoscopy;  Laterality: N/A;  1:45pm  . EYE SURGERY     right cataract removal   . GIVENS CAPSULE STUDY N/A 05/17/2018   Procedure: GIVENS CAPSULE STUDY;  Surgeon: Danie Binder, MD;  [facility];  Service: Endoscopy;  Laterality: N/A;  . TOTAL ABDOMINAL HYSTERECTOMY W/ BILATERAL SALPINGOOPHORECTOMY  2003  . TUBAL LIGATION     Allergies  Allergen Reactions  . Gabapentin   . Tramadol   . Codeine Nausea And Vomiting  .  Penicillins Rash   Current Outpatient Medications  Medication Sig    . Biotin 10 MG CAPS Take 10 mg by mouth daily.    Marland Kitchen BLACK CURRANT SEED OIL PO Take by mouth daily.    . Cholecalciferol (VITAMIN D) 125 MCG (5000 UT) CAPS Take by mouth daily.    . COLLAGEN PO Take by mouth daily.    . cyclobenzaprine (FLEXERIL) 10 MG tablet Take one tablet at bedtime for back pain and spasm    . diclofenac sodium (VOLTAREN) 1 % GEL Apply 1 application topically as needed.     . Flaxseed, Linseed, (FLAX SEEDS) POWD Take by mouth daily.    Marland Kitchen omeprazole (PRILOSEC) 20 MG capsule Take 1 capsule (20 mg total) by mouth daily.    Marland Kitchen OVER THE COUNTER MEDICATION CBD oil if needed    .       Review of Systems PER HPI OTHERWISE ALL SYSTEMS ARE NEGATIVE.    Objective:   Physical Exam Constitutional:      General: She is not in acute distress.    Appearance: Normal appearance.  HENT:     Mouth/Throat:     Comments: MASK IN PLACE Eyes:     General: No scleral icterus.    Pupils: Pupils are equal, round, and reactive to light.  Cardiovascular:     Rate and Rhythm: Normal rate and regular rhythm.  Pulses: Normal pulses.     Heart sounds: Normal heart sounds.  Pulmonary:     Effort: Pulmonary effort is normal.     Breath sounds: Normal breath sounds.  Abdominal:     General: Bowel sounds are normal.     Palpations: Abdomen is soft.     Tenderness: There is no abdominal tenderness.  Musculoskeletal:     Cervical back: Normal range of motion.     Right lower leg: No edema.     Left lower leg: No edema.  Lymphadenopathy:     Cervical: No cervical adenopathy.  Skin:    General: Skin is warm and dry.  Neurological:     Mental Status: She is alert and oriented to person, place, and time.     Comments: NO  NEW FOCAL DEFICITS  Psychiatric:        Mood and Affect: Mood normal.     Comments: NORMAL AFFECT       Assessment & Plan:

## 2019-09-06 NOTE — Assessment & Plan Note (Signed)
SYMPTOMS CONTROLLED/RESOLVED.  CONTINUE OMEPRAZOLE.  TAKE 30 MINUTES PRIOR TO YOUR FIRST MEAL. FOLLOW UP IN 1 YEAR.

## 2019-09-06 NOTE — Patient Instructions (Signed)
DRINK WATER TO KEEP YOUR URINE LIGHT YELLOW.  Eat fiber.  CONTINUE OMEPRAZOLE.  TAKE 30 MINUTES PRIOR TO YOUR FIRST MEAL.   USE BENTYL WHEN NEEDED TO SLOW DOWN BOWELS FOR WORK. IT MAY CAUSE DROWSINESS, DRY EYES/MOUTH, BLURRY VISION, OR DIFFICULTY URINATING.  FOLLOW UP IN 1 YEAR.

## 2019-09-07 NOTE — Progress Notes (Signed)
Cc'ed to pcp °

## 2019-11-02 ENCOUNTER — Telehealth: Payer: Self-pay | Admitting: Emergency Medicine

## 2019-11-02 MED ORDER — RIFAXIMIN 550 MG PO TABS: 550.0000 mg | ORAL_TABLET | Freq: Three times a day (TID) | ORAL | 0 refills | Status: AC

## 2019-11-02 NOTE — Telephone Encounter (Signed)
Pt called verified name and dob pt stated she d/c dicyclomine about 7 days ago because its not stopping her diarrhea and it is giving her dry mouth. pt stated she is having about 4 bm's per day and has not been on antibiotics lately, shes been taking imodium.. states she has diarrhea no matter what she eats

## 2019-11-02 NOTE — Addendum Note (Signed)
Addended by: Mahala Menghini on: 11/02/2019 08:49 AM   Modules accepted: Orders

## 2019-11-02 NOTE — Telephone Encounter (Signed)
Called pt and notified her of recommendations and rx was sent into pt pharmacy

## 2019-11-02 NOTE — Telephone Encounter (Signed)
Reviewed last OV note by SLF. H/O IBS. OK to stop dicyclomine.  After review of possible options based on insurance coverage, I have sent Xifaxan 550mg  TID for 14 days for IBS-D.  She should call at end of treatment and let us know her response to the medication.

## 2019-11-14 NOTE — Telephone Encounter (Signed)
recevied a PA approval fro Xifaxan 550 mg x 2 weeks. Approval is good through 11/23/2019. Approval letter will be scanned in chart.

## 2019-11-23 NOTE — Telephone Encounter (Signed)
Pt called. Pt keeps calling the pharmacy and it says the medication needs authorization. Called [facility] , pharmacist ran medication through with a $0 copay. Pt is aware and will pick medication up.

## 2019-12-13 ENCOUNTER — Ambulatory Visit: Payer: Commercial Managed Care - PPO | Admitting: Family Medicine

## 2019-12-18 ENCOUNTER — Ambulatory Visit (INDEPENDENT_AMBULATORY_CARE_PROVIDER_SITE_OTHER): Payer: Commercial Managed Care - PPO | Admitting: Family Medicine

## 2019-12-18 ENCOUNTER — Encounter: Payer: Self-pay | Admitting: Family Medicine

## 2019-12-18 ENCOUNTER — Other Ambulatory Visit: Payer: Self-pay

## 2019-12-18 ENCOUNTER — Ambulatory Visit (HOSPITAL_COMMUNITY)
Admission: RE | Admit: 2019-12-18 | Discharge: 2019-12-18 | Disposition: A | Discharge status: Home / Self Care | Payer: Commercial Managed Care - PPO | Source: Ambulatory Visit | Attending: Family Medicine | Admitting: Family Medicine

## 2019-12-18 VITALS — BP 157/86 | HR 60 | Resp 15 | Ht 68.0 in | Wt 330.0 lb

## 2019-12-18 DIAGNOSIS — Z708 Other sex counseling: Secondary | ICD-10-CM

## 2019-12-18 DIAGNOSIS — F419 Anxiety disorder, unspecified: Secondary | ICD-10-CM

## 2019-12-18 DIAGNOSIS — M79672 Pain in left foot: Secondary | ICD-10-CM

## 2019-12-18 DIAGNOSIS — M79671 Pain in right foot: Secondary | ICD-10-CM | POA: Diagnosis present

## 2019-12-18 DIAGNOSIS — M25561 Pain in right knee: Secondary | ICD-10-CM | POA: Diagnosis present

## 2019-12-18 DIAGNOSIS — Z1159 Encounter for screening for other viral diseases: Secondary | ICD-10-CM

## 2019-12-18 DIAGNOSIS — I10 Essential (primary) hypertension: Secondary | ICD-10-CM

## 2019-12-18 DIAGNOSIS — E8881 Metabolic syndrome: Secondary | ICD-10-CM

## 2019-12-18 DIAGNOSIS — E559 Vitamin D deficiency, unspecified: Secondary | ICD-10-CM

## 2019-12-18 DIAGNOSIS — F321 Major depressive disorder, single episode, moderate: Secondary | ICD-10-CM

## 2019-12-18 MED ORDER — FLUOXETINE HCL 20 MG PO TABS: 20.0000 mg | ORAL_TABLET | Freq: Every day | ORAL | 3 refills | Status: DC

## 2019-12-18 MED ORDER — TRIAMTERENE-HCTZ 37.5-25 MG PO TABS: 1.0000 | ORAL_TABLET | Freq: Every day | ORAL | 3 refills | Status: DC

## 2019-12-18 NOTE — Patient Instructions (Addendum)
Follow-up as before call if you need me sooner.  New medication to be started for depression and anxiety fluoxetine 20 mg 1 daily.  You are also referred to therapy as well as work note from today to return next week Monday, July 19.  New for blood pressure is Maxide 25 mg 1 daily.  Please increase fruit and vegetable and reduce processed foods and salt.  Fasting labs from your job within the next 1 week please.  CBC lipid CMP and EGFR TSH vitamin D hemoglobin A1c, Hepatitis c screen and hIV screen  Mammogram to bescheduled at checkout, at [facility], none in over 5 years!!!  X-ray today because of severe bilateral foot pain right worse than left as well as arthritis in the right knee.  We will request records from your podiatrist Dr. Gershon Mussel as well as orthopedic surgeon in [place] who diagnosed him with arthritis of the knee for you  Records  Think about what you will eat, plan ahead. Choose " clean, green, fresh or frozen" over canned, processed or packaged foods which are more sugary, salty and fatty. 70 to 75% of food eaten should be vegetables and fruit. Three meals at set times with snacks allowed between meals, but they must be fruit or vegetables. Aim to eat over a 12 hour period , example 7 am to 7 pm, and STOP after  your last meal of the day. Drink water,generally about 64 ounces per day, no other drink is as healthy. Fruit juice is best enjoyed in a healthy way, by EATING the fruit. Thanks for choosing [facility], we consider it a privelige to serve you.

## 2019-12-18 NOTE — Progress Notes (Signed)
Shannon Roth     MRN: [id number]      DOB: 1965-10-09   HPI Shannon Roth is here for follow up and re-evaluation of chronic medical conditions, medication management and review of any available recent lab and radiology data.  Preventive health is updated, specifically  Cancer screening and Immunization.   Questions or concerns regarding consultations or procedures which the PT has had in the interim are  addressed. The PT denies any adverse reactions to current medications since the last visit.  C/o increased and uncontrolled x 2 years , stress on the job,and uncontrolled anxiety and depression, at home responsible financially for son and grandson, and feels work environment to be very stressfull ROS Denies recent fever or chills. Denies sinus pressure, nasal congestion, ear pain or sore throat. Denies chest congestion, productive cough or wheezing. Denies chest pains, palpitations and leg swelling Denies abdominal pain, nausea, vomiting,diarrhea or constipation.   Denies dysuria, frequency, hesitancy or incontinence. C/o bilateral foot pain, has been seeing Podiatry, also severe right knee pain limiting mobility, requests handicap sticker renewal.  Denies skin break down or rash. Denies uncontrolled headaches PE  BP (!) 157/86   Pulse 60   Resp 15   Ht 5\' 8"  (1.727 m)   Wt (!) 330 lb (149.7 kg)   SpO2 99%   BMI 50.18 kg/m   Patient alert and oriented and in no cardiopulmonary distress.  HEENT: No facial asymmetry, EOMI,     Neck supple .  Chest: Clear to auscultation bilaterally.  CVS: S1, S2 no murmurs, no S3.Regular rate.  ABD: Soft non tender.   Ext: No edema  MS: decreased ROM lumbarspine and right knee with crepitus and deformity. Skin: Intact, no ulcerations or rash noted.  Psych: Good eye contact, normal affect. Memory intact not anxious or depressed appearing.  CNS: CN 2-12 intact, power,  normal throughout.no focal deficits noted.   Assessment &  Plan  Anxiety Uncontrolled , work excuse x 1 week and referral, start fluoxetine, needs Psych management  Depression, major, single episode, moderate ([place]) Start fluoxetine and refer to psych work excuse x 1 week  Morbid obesity  Patient re-educated about  the importance of commitment to a  minimum of 150 minutes of exercise per week as able.  The importance of healthy food choices with portion control discussed, as well as eating regularly and within a 12 hour window most days. The need to choose "clean , green" food 50 to 75% of the time is discussed, as well as to make water the primary drink and set a goal of 64 ounces water daily.    Weight /BMI 12/18/2019 09/06/2019 08/09/2019  WEIGHT 330 lb 337 lb 12.8 oz 341 lb  HEIGHT 5\' 8"  - 5\' 8"   BMI 50.18 kg/m2 51.36 kg/m2 51.85 kg/m2    Improved, which is good  Essential hypertension Start daily maxzide and re eval in 5 6 to 8 weeks DASH diet and commitment to daily physical activity for a minimum of 30 minutes discussed and encouraged, as a part of hypertension management. The importance of attaining a healthy weight is also discussed.  BP/Weight 12/18/2019 09/06/2019 08/09/2019 06/20/2019 05/24/2019 05/17/2018 62/07/6331  Systolic BP 545 625 [phone] 811  Diastolic BP 86 76 78 82 85 64 83  Wt. (Lbs) 330 337.8 341 335 335.4 - 312.4  BMI 50.18 51.36 51.85 50.94 51 - 47.5       Knee pain, right X ray demonstartes severe arthritis,  has difficulty ambulating, handicap sticker x 6 months  Foot pain, bilateral boilaeral foot pain, x ray shows arthritis,  Has been treated  Up to 3 monhts ago for possible plantar fascitis, requests handicap sticker for foot pain

## 2019-12-18 NOTE — Assessment & Plan Note (Addendum)
Uncontrolled , work excuse x 1 week and referral, start fluoxetine, needs Psych management

## 2019-12-19 ENCOUNTER — Encounter: Payer: Self-pay | Admitting: Family Medicine

## 2019-12-19 ENCOUNTER — Telehealth: Payer: Self-pay | Admitting: Family Medicine

## 2019-12-19 DIAGNOSIS — M79672 Pain in left foot: Secondary | ICD-10-CM | POA: Insufficient documentation

## 2019-12-19 DIAGNOSIS — M79671 Pain in right foot: Secondary | ICD-10-CM | POA: Insufficient documentation

## 2019-12-19 DIAGNOSIS — I10 Essential (primary) hypertension: Secondary | ICD-10-CM | POA: Insufficient documentation

## 2019-12-19 DIAGNOSIS — F321 Major depressive disorder, single episode, moderate: Secondary | ICD-10-CM | POA: Insufficient documentation

## 2019-12-19 NOTE — Assessment & Plan Note (Signed)
Start daily maxzide and re eval in 5 6 to 8 weeks DASH diet and commitment to daily physical activity for a minimum of 30 minutes discussed and encouraged, as a part of hypertension management. The importance of attaining a healthy weight is also discussed.  BP/Weight 12/18/2019 09/06/2019 08/09/2019 06/20/2019 05/24/2019 05/17/2018 51/01/8415  Systolic BP 606 301 601 093 235 573 220  Diastolic BP 86 76 78 82 85 64 83  Wt. (Lbs) 330 337.8 341 335 335.4 - 312.4  BMI 50.18 51.36 51.85 50.94 51 - 47.5

## 2019-12-19 NOTE — Assessment & Plan Note (Signed)
Start fluoxetine and refer to psych work excuse x 1 week

## 2019-12-19 NOTE — Assessment & Plan Note (Signed)
  Patient re-educated about  the importance of commitment to a  minimum of 150 minutes of exercise per week as able.  The importance of healthy food choices with portion control discussed, as well as eating regularly and within a 12 hour window most days. The need to choose "clean , green" food 50 to 75% of the time is discussed, as well as to make water the primary drink and set a goal of 64 ounces water daily.    Weight /BMI 12/18/2019 09/06/2019 08/09/2019  WEIGHT 330 lb 337 lb 12.8 oz 341 lb  HEIGHT 5\' 8"  - 5\' 8"   BMI 50.18 kg/m2 51.36 kg/m2 51.85 kg/m2    Improved, which is good

## 2019-12-19 NOTE — Telephone Encounter (Signed)
Patient called and would like to speak to Dr. Moshe Cipro ONLY. When asked, patient said Dr. Moshe Cipro and her had a conversation last time she was here and would only like to speak to her before 1:00 if possible [phone]

## 2019-12-19 NOTE — Assessment & Plan Note (Signed)
X ray demonstartes severe arthritis, has difficulty ambulating, handicap sticker x 6 months

## 2019-12-19 NOTE — Assessment & Plan Note (Signed)
boilaeral foot pain, x ray shows arthritis,  Has been treated  Up to 3 monhts ago for possible plantar fascitis, requests handicap sticker for foot pain

## 2019-12-20 NOTE — Telephone Encounter (Signed)
Pt is scheduled with schooler 12-21-19 and ok with Dr Moshe Cipro

## 2019-12-20 NOTE — Telephone Encounter (Signed)
Pt needs Psychiatry appt asap, hopefully within 1 week, as she needs to bhe out of work on an extended period , I had originally provided 1 week, this call is letting me know that she needs longer. Her management with psychology is also very important ,  thank you for your help. Pls let her know appt info also , thanks

## 2019-12-21 ENCOUNTER — Telehealth: Payer: Self-pay

## 2019-12-21 ENCOUNTER — Ambulatory Visit (INDEPENDENT_AMBULATORY_CARE_PROVIDER_SITE_OTHER): Payer: Commercial Managed Care - PPO | Admitting: Psychologist

## 2019-12-21 DIAGNOSIS — F411 Generalized anxiety disorder: Secondary | ICD-10-CM

## 2019-12-21 DIAGNOSIS — F321 Major depressive disorder, single episode, moderate: Secondary | ICD-10-CM | POA: Diagnosis not present

## 2019-12-21 NOTE — Telephone Encounter (Signed)
I spoke with pt personally, states she " went off" on everyone today except her grandsomn. 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 I will write out of work until 07/29 for mental heALTH [place] TO SEE IF pSYCHIATRY CAN BE INVOLVED SOONER

## 2019-12-21 NOTE — Telephone Encounter (Signed)
Pt called this AM, and the note was not passed, however I spoke with the pt and told her that I would send a msg and place a note on your desk.  Pt talked to the Dr that you referred her to. She feels like no one is listening to her, and she feels like she is about to lose it...  She NEEDS HELP.Marland Kitchen She wants Dr Moshe Cipro to please call her

## 2019-12-21 NOTE — Telephone Encounter (Signed)
Called patient and left message for them to return call at the office   

## 2019-12-25 ENCOUNTER — Encounter: Payer: Self-pay | Admitting: Family Medicine

## 2019-12-25 ENCOUNTER — Telehealth: Payer: Self-pay

## 2019-12-25 NOTE — Telephone Encounter (Signed)
FMLA Noted Copied sleeved

## 2019-12-26 DIAGNOSIS — F09 Unspecified mental disorder due to known physiological condition: Secondary | ICD-10-CM

## 2019-12-26 NOTE — Telephone Encounter (Signed)
FMLA form is completed.

## 2019-12-26 NOTE — Telephone Encounter (Signed)
I agree with pt being out of work until 08/02.2021 when she returns here for re evaluation, and will complete FMLA form

## 2020-01-02 ENCOUNTER — Ambulatory Visit: Payer: Commercial Managed Care - PPO

## 2020-01-03 ENCOUNTER — Ambulatory Visit: Payer: Commercial Managed Care - PPO | Admitting: Psychologist

## 2020-01-03 ENCOUNTER — Ambulatory Visit
Admission: RE | Admit: 2020-01-03 | Discharge: 2020-01-03 | Disposition: A | Discharge status: Home / Self Care | Payer: Commercial Managed Care - PPO | Source: Ambulatory Visit | Attending: Family Medicine | Admitting: Family Medicine

## 2020-01-03 DIAGNOSIS — Z1231 Encounter for screening mammogram for malignant neoplasm of breast: Secondary | ICD-10-CM

## 2020-01-08 ENCOUNTER — Other Ambulatory Visit: Payer: Self-pay

## 2020-01-08 ENCOUNTER — Ambulatory Visit (INDEPENDENT_AMBULATORY_CARE_PROVIDER_SITE_OTHER): Payer: Commercial Managed Care - PPO | Admitting: Family Medicine

## 2020-01-08 ENCOUNTER — Encounter: Payer: Self-pay | Admitting: Family Medicine

## 2020-01-08 VITALS — BP 170/92 | HR 61 | Temp 98.5°F | Resp 18 | Ht 68.0 in | Wt 332.8 lb

## 2020-01-08 DIAGNOSIS — F419 Anxiety disorder, unspecified: Secondary | ICD-10-CM

## 2020-01-08 DIAGNOSIS — Z1159 Encounter for screening for other viral diseases: Secondary | ICD-10-CM

## 2020-01-08 DIAGNOSIS — Z23 Encounter for immunization: Secondary | ICD-10-CM

## 2020-01-08 DIAGNOSIS — F321 Major depressive disorder, single episode, moderate: Secondary | ICD-10-CM

## 2020-01-08 DIAGNOSIS — D509 Iron deficiency anemia, unspecified: Secondary | ICD-10-CM

## 2020-01-08 DIAGNOSIS — H9113 Presbycusis, bilateral: Secondary | ICD-10-CM | POA: Insufficient documentation

## 2020-01-08 DIAGNOSIS — I10 Essential (primary) hypertension: Secondary | ICD-10-CM | POA: Diagnosis not present

## 2020-01-08 DIAGNOSIS — E559 Vitamin D deficiency, unspecified: Secondary | ICD-10-CM

## 2020-01-08 DIAGNOSIS — D619 Aplastic anemia, unspecified: Secondary | ICD-10-CM

## 2020-01-08 DIAGNOSIS — R7302 Impaired glucose tolerance (oral): Secondary | ICD-10-CM

## 2020-01-08 DIAGNOSIS — Z114 Encounter for screening for human immunodeficiency virus [HIV]: Secondary | ICD-10-CM

## 2020-01-08 DIAGNOSIS — Z13220 Encounter for screening for lipoid disorders: Secondary | ICD-10-CM

## 2020-01-08 DIAGNOSIS — H9313 Tinnitus, bilateral: Secondary | ICD-10-CM | POA: Insufficient documentation

## 2020-01-08 DIAGNOSIS — E8881 Metabolic syndrome: Secondary | ICD-10-CM

## 2020-01-08 MED ORDER — FLUOXETINE HCL 40 MG PO CAPS
40.0000 mg | ORAL_CAPSULE | Freq: Every day | ORAL | 3 refills | Status: DC
Start: 2020-01-08 — End: 2020-03-12

## 2020-01-08 MED ORDER — BUSPIRONE HCL 5 MG PO TABS: 5.0000 mg | ORAL_TABLET | Freq: Three times a day (TID) | ORAL | 2 refills | Status: DC

## 2020-01-08 NOTE — Patient Instructions (Addendum)
F/U early September in office with MD, call if you need me sooner  Increase fluoxetine to 40 mg daily, and ad buspar three times daily for anxiety   you need to keep therapy appointments and I hope that you see Psychiatry soon  Shingrix #1 today  Please get labs today reprint for [facility]  Work excuse  From today to return Sept 6, 2021  It is important that you exercise regularly at least 30 minutes 5 times a week. If you develop chest pain, have severe difficulty breathing, or feel very tired, stop exercising immediately and seek medical attention   Think about what you will eat, plan ahead. Choose " clean, green, fresh or frozen" over canned, processed or packaged foods which are more sugary, salty and fatty. 70 to 75% of food eaten should be vegetables and fruit. Three meals at set times with snacks allowed between meals, but they must be fruit or vegetables. Aim to eat over a 12 hour period , example 7 am to 7 pm, and STOP after  your last meal of the day. Drink water,generally about 64 ounces per day, no other drink is as healthy. Fruit juice is best enjoyed in a healthy way, by EATING the fruit.

## 2020-01-08 NOTE — Progress Notes (Signed)
Shannon Roth     MRN: [id number]      DOB: 11-19-1965   HPI Shannon Roth is here for follow up and re-evaluation of severe depression and anxiety States she recently did not pass one of her classes last Monday, and this made her feel like a failure and her depression has worsened. Unable to connect with therapist last week Wednesday, saw a Counsellor , Ander Slade, PhD at her school this past Friday, at [employer] where she has just started a course in Accounting Has suicidal thoughts as recently as last week , when she failed her exam, her grandson keeps her from following through and invested in life. Feels as is she is alone in the struggle, little support from siblings     ROS Denies recent fever or chills. Denies sinus pressure, nasal congestion, ear pain or sore throat. Denies chest congestion, productive cough or wheezing. Denies chest pains, palpitations and leg swelling Denies abdominal pain, nausea, vomiting,diarrhea or constipation.   Denies dysuria, frequency, hesitancy or incontinence. c/o chronic int pain,  and limitation in mobility. Denies headaches, seizures, numbness, or tingling. . Denies skin break down or rash.   PE  BP (!) 170/92 (BP Location: Left Arm, Patient Position: Sitting, Cuff Size: Normal)   Pulse 61   Temp 98.5 F (36.9 C) (Oral)   Resp 18   Ht 5\' 8"  (1.727 m)   Wt (!) 332 lb 12.8 oz (151 kg)   SpO2 97%   BMI 50.60 kg/m   Patient alert and oriented and in no cardiopulmonary distress.  HEENT: No facial asymmetry, EOMI,     Neck supple .  Chest: Clear to auscultation bilaterally.  CVS: S1, S2 no murmurs, no S3.Regular rate.  ABD: Soft non tender.   Ext: No edema  MS:  reduced  ROM spine, , hips and knees.  Skin: Intact, no ulcerations or rash noted.  Psych: Good eye contact, normal affect. Memory intact  Anxious and at times tearful and  depressed appearing.  CNS: CN 2-12 intact, power,  normal throughout.no focal deficits  noted.   Assessment & Plan  Depression, major, single episode, moderate ([facility]) Increased feeling of worthlessness, failed basic math test last week. Has spoken to school counsellor. Her grandson give her the drive to live as at times she wonders why. Has no active suicidal or homicidal [plans. Needs Psych , inc dose fluoxetine will attmept to reach out o her therapist to move forward   Essential hypertension Uncontrolled needs to take maxzide once daily as prescribed DASH diet and commitment to daily physical activity for a minimum of 30 minutes discussed and encouraged, as a part of hypertension management. The importance of attaining a healthy weight is also discussed.  BP/Weight 01/08/2020 12/18/2019 09/06/2019 08/09/2019 06/20/2019 05/24/2019 53/66/4403  Systolic BP 474 259 563 875 643 329 518  Diastolic BP 92 86 76 78 82 85 64  Wt. (Lbs) 332.8 330 337.8 341 335 335.4 -  BMI 50.6 50.18 51.36 51.85 50.94 51 -     F/u in 6 weeks  Anxiety Increase buspar to three times daily, needs Psych treatment also  Morbid obesity  Patient re-educated about  the importance of commitment to a  minimum of 150 minutes of exercise per week as able.  The importance of healthy food choices with portion control discussed, as well as eating regularly and within a 12 hour window most days. The need to choose "clean , green" food 50 to 75% of the  time is discussed, as well as to make water the primary drink and set a goal of 64 ounces water daily.    Weight /BMI 01/08/2020 12/18/2019 09/06/2019  WEIGHT 332 lb 12.8 oz 330 lb 337 lb 12.8 oz  HEIGHT 5\' 8"  5\' 8"  -  BMI 50.6 kg/m2 50.18 kg/m2 51.36 kg/m2      Vitamin D deficiency Needs once weekly vitamin D  Need for shingles vaccine After obtaining informed consent, the vaccine is  administered , with no adverse effect noted at the time of administration.

## 2020-01-09 ENCOUNTER — Other Ambulatory Visit: Payer: Self-pay | Admitting: Family Medicine

## 2020-01-09 LAB — CMP14+EGFR
ALT: 20 IU/L (ref 0–32)
AST: 21 IU/L (ref 0–40)
Albumin/Globulin Ratio: 1.4 (ref 1.2–2.2)
Albumin: 4.4 g/dL (ref 3.8–4.9)
Alkaline Phosphatase: 121 IU/L (ref 48–121)
BUN/Creatinine Ratio: 18 (ref 9–23)
BUN: 14 mg/dL (ref 6–24)
Bilirubin Total: 0.3 mg/dL (ref 0.0–1.2)
CO2: 22 mmol/L (ref 20–29)
Calcium: 9.5 mg/dL (ref 8.7–10.2)
Chloride: 103 mmol/L (ref 96–106)
Creatinine, Ser: 0.76 mg/dL (ref 0.57–1.00)
GFR calc Af Amer: 103 mL/min/{1.73_m2} (ref >59)
GFR calc non Af Amer: 89 mL/min/{1.73_m2} (ref >59)
Globulin, Total: 3.1 g/dL (ref 1.5–4.5)
Glucose: 83 mg/dL (ref 65–99)
Potassium: 4 mmol/L (ref 3.5–5.2)
Sodium: 142 mmol/L (ref 134–144)
Total Protein: 7.5 g/dL (ref 6.0–8.5)

## 2020-01-09 LAB — LIPID PANEL
Chol/HDL Ratio: 4 ratio (ref 0.0–4.4)
Cholesterol, Total: 210 mg/dL — ABNORMAL HIGH (ref 100–199)
HDL: 53 mg/dL (ref >39)
LDL Chol Calc (NIH): 137 mg/dL — ABNORMAL HIGH (ref 0–99)
Triglycerides: 113 mg/dL (ref 0–149)
VLDL Cholesterol Cal: 20 mg/dL (ref 5–40)

## 2020-01-09 LAB — CBC
Hematocrit: 38.9 % (ref 34.0–46.6)
Hemoglobin: 11.9 g/dL (ref 11.1–15.9)
MCH: 22.8 pg — ABNORMAL LOW (ref 26.6–33.0)
MCHC: 30.6 g/dL — ABNORMAL LOW (ref 31.5–35.7)
MCV: 75 fL — ABNORMAL LOW (ref 79–97)
Platelets: 332 10*3/uL (ref 150–450)
RBC: 5.21 x10E6/uL (ref 3.77–5.28)
RDW: 14.5 % (ref 11.7–15.4)
WBC: 6.8 10*3/uL (ref 3.4–10.8)

## 2020-01-09 LAB — HEMOGLOBIN A1C
Est. average glucose Bld gHb Est-mCnc: 114 mg/dL
Hgb A1c MFr Bld: 5.6 % (ref 4.8–5.6)

## 2020-01-09 LAB — HIV ANTIBODY (ROUTINE TESTING W REFLEX): HIV Screen 4th Generation wRfx: NONREACTIVE

## 2020-01-09 LAB — VITAMIN D 25 HYDROXY (VIT D DEFICIENCY, FRACTURES): Vit D, 25-Hydroxy: 10 ng/mL — ABNORMAL LOW (ref 30.0–100.0)

## 2020-01-09 LAB — HEPATITIS C ANTIBODY: Hep C Virus Ab: <0.1 s/co ratio (ref 0.0–0.9)

## 2020-01-09 LAB — TSH: TSH: 2.67 u[IU]/mL (ref 0.450–4.500)

## 2020-01-09 MED ORDER — ERGOCALCIFEROL 1.25 MG (50000 UT) PO CAPS: 50000.0000 [IU] | ORAL_CAPSULE | ORAL | 2 refills | Status: DC

## 2020-01-09 NOTE — Assessment & Plan Note (Signed)
Increased feeling of worthlessness, failed basic math test last week. Has spoken to school counsellor. Her grandson give her the drive to live as at times she wonders why. Has no active suicidal or homicidal [plans. Needs Psych , inc dose fluoxetine will attmept to reach out o her therapist to move forward

## 2020-01-12 DIAGNOSIS — Z23 Encounter for immunization: Secondary | ICD-10-CM | POA: Insufficient documentation

## 2020-01-12 DIAGNOSIS — E559 Vitamin D deficiency, unspecified: Secondary | ICD-10-CM | POA: Insufficient documentation

## 2020-01-12 NOTE — Assessment & Plan Note (Signed)
Increase buspar to three times daily, needs Psych treatment also

## 2020-01-12 NOTE — Assessment & Plan Note (Signed)
Needs once weekly vitamin D

## 2020-01-12 NOTE — Assessment & Plan Note (Signed)
Uncontrolled needs to take maxzide once daily as prescribed DASH diet and commitment to daily physical activity for a minimum of 30 minutes discussed and encouraged, as a part of hypertension management. The importance of attaining a healthy weight is also discussed.  BP/Weight 01/08/2020 12/18/2019 09/06/2019 08/09/2019 06/20/2019 05/24/2019 64/84/7207  Systolic BP 218 288 337 445 146 047 998  Diastolic BP 92 86 76 78 82 85 64  Wt. (Lbs) 332.8 330 337.8 341 335 335.4 -  BMI 50.6 50.18 51.36 51.85 50.94 51 -     F/u in 6 weeks

## 2020-01-12 NOTE — Assessment & Plan Note (Signed)
After obtaining informed consent, the vaccine is  administered , with no adverse effect noted at the time of administration.  

## 2020-01-12 NOTE — Assessment & Plan Note (Signed)
  Patient re-educated about  the importance of commitment to a  minimum of 150 minutes of exercise per week as able.  The importance of healthy food choices with portion control discussed, as well as eating regularly and within a 12 hour window most days. The need to choose "clean , green" food 50 to 75% of the time is discussed, as well as to make water the primary drink and set a goal of 64 ounces water daily.    Weight /BMI 01/08/2020 12/18/2019 09/06/2019  WEIGHT 332 lb 12.8 oz 330 lb 337 lb 12.8 oz  HEIGHT 5\' 8"  5\' 8"  -  BMI 50.6 kg/m2 50.18 kg/m2 51.36 kg/m2

## 2020-01-15 ENCOUNTER — Ambulatory Visit (INDEPENDENT_AMBULATORY_CARE_PROVIDER_SITE_OTHER): Payer: Commercial Managed Care - PPO | Admitting: Psychologist

## 2020-01-15 DIAGNOSIS — F411 Generalized anxiety disorder: Secondary | ICD-10-CM | POA: Diagnosis not present

## 2020-01-15 DIAGNOSIS — F321 Major depressive disorder, single episode, moderate: Secondary | ICD-10-CM | POA: Diagnosis not present

## 2020-01-25 ENCOUNTER — Ambulatory Visit: Payer: Commercial Managed Care - PPO | Admitting: Psychologist

## 2020-01-25 ENCOUNTER — Ambulatory Visit (INDEPENDENT_AMBULATORY_CARE_PROVIDER_SITE_OTHER): Payer: Commercial Managed Care - PPO | Admitting: Psychologist

## 2020-01-25 DIAGNOSIS — F411 Generalized anxiety disorder: Secondary | ICD-10-CM

## 2020-01-25 DIAGNOSIS — F321 Major depressive disorder, single episode, moderate: Secondary | ICD-10-CM

## 2020-01-31 ENCOUNTER — Telehealth: Payer: Self-pay | Admitting: Family Medicine

## 2020-01-31 NOTE — Telephone Encounter (Signed)
Form here for pt from [place] requesting evaluation for determination of learning disability, needs a Psychologist to do this, please let her know, hopefully her current psychologist is able, she should have form sent to him, if he is unable will need to look furhter , I think dr Jefm Miles may bhe an option , but p prefer to stay with Doc she is already seeing, so start with Dr Michail Sermon.  ?? Please ask

## 2020-01-31 NOTE — Telephone Encounter (Signed)
[employer] forms completed Copied Pt notified via VM

## 2020-02-01 LAB — SPECIMEN STATUS REPORT

## 2020-02-01 LAB — FERRITIN: Ferritin: 467 ng/mL — ABNORMAL HIGH (ref 15–150)

## 2020-02-01 LAB — IRON: Iron: 80 ug/dL (ref 27–159)

## 2020-02-01 NOTE — Telephone Encounter (Signed)
I have talked to the patient, pt states that she asked Dr Michail Sermon, and he will not complete, he makes the pt feel like he does not want to be bothered.  Conflicts with scheduling and school times.  Dr Michail Sermon states he does not know her that well to complete the forms.  I have placed the paperwork back in the brown folder, waiting for direction

## 2020-02-01 NOTE — Telephone Encounter (Signed)
I will speak with Dr Michail Sermon for some direction this afternoon

## 2020-02-05 NOTE — Telephone Encounter (Signed)
FMLA- questioning what is going on with the form

## 2020-02-06 NOTE — Telephone Encounter (Signed)
Form was completed and handed over on 02/05/2020 , I think this has been sorted out, just send a message if still a concern, thank you

## 2020-02-07 ENCOUNTER — Other Ambulatory Visit: Payer: Self-pay | Admitting: Family Medicine

## 2020-02-07 ENCOUNTER — Ambulatory Visit: Payer: Commercial Managed Care - PPO | Admitting: Family Medicine

## 2020-02-07 DIAGNOSIS — F322 Major depressive disorder, single episode, severe without psychotic features: Secondary | ICD-10-CM

## 2020-02-07 DIAGNOSIS — F419 Anxiety disorder, unspecified: Secondary | ICD-10-CM

## 2020-02-07 NOTE — Telephone Encounter (Signed)
Pls let her know that I am trying to find a Psychologist who is qualified to do this. Let her know that I am not.Marland Kitchen

## 2020-02-07 NOTE — Telephone Encounter (Signed)
Lindajo Royal is working on this.

## 2020-02-07 NOTE — Telephone Encounter (Signed)
Hey Dr Moshe Cipro  The FMLA portion is completed, however the pt is questioning the form for Disability Services, This is the form that you advised Should be sent to the other Dr.

## 2020-02-07 NOTE — Telephone Encounter (Signed)
Can you help me with this?

## 2020-02-07 NOTE — Telephone Encounter (Signed)
I have entered a referral for Dr Jefm Miles, please follow through, thanks

## 2020-02-08 ENCOUNTER — Telehealth: Payer: Self-pay | Admitting: Family Medicine

## 2020-02-08 NOTE — Telephone Encounter (Signed)
Re cognitive testing for intellectual disability, I have heard from Dr Michail Sermon , we can reach out to [facility], also he provided a name [facility] psychologist in [place] number [phone] also.  If you have already been able to set up an appt for her , then no need to contact either of these

## 2020-02-09 ENCOUNTER — Telehealth: Payer: Self-pay

## 2020-02-09 NOTE — Telephone Encounter (Signed)
Pt called and said [facility] sent another form last night.  The form is on Dr Camillia Herter desk and I have talked with Dr Moshe Cipro on the phone today and she is aware of the form.

## 2020-02-09 NOTE — Telephone Encounter (Signed)
I have sent updated provider list regarding recommendation for provider who can do assessment for intellectual disability to [facility] in the event that Dr Jefm Miles does not do this, per Dr Michail Sermon, info provided 02-11-2023 and passed on

## 2020-02-14 DIAGNOSIS — Z1331 Encounter for screening for depression: Secondary | ICD-10-CM

## 2020-02-29 ENCOUNTER — Ambulatory Visit: Payer: Commercial Managed Care - PPO | Admitting: Family Medicine

## 2020-03-12 ENCOUNTER — Encounter: Payer: Self-pay | Admitting: Family Medicine

## 2020-03-12 ENCOUNTER — Other Ambulatory Visit: Payer: Self-pay

## 2020-03-12 ENCOUNTER — Ambulatory Visit (INDEPENDENT_AMBULATORY_CARE_PROVIDER_SITE_OTHER): Payer: Commercial Managed Care - PPO | Admitting: Family Medicine

## 2020-03-12 VITALS — BP 138/84 | Resp 16 | Ht 68.0 in | Wt 331.0 lb

## 2020-03-12 DIAGNOSIS — I10 Essential (primary) hypertension: Secondary | ICD-10-CM | POA: Diagnosis not present

## 2020-03-12 DIAGNOSIS — M5410 Radiculopathy, site unspecified: Secondary | ICD-10-CM

## 2020-03-12 DIAGNOSIS — F321 Major depressive disorder, single episode, moderate: Secondary | ICD-10-CM | POA: Diagnosis not present

## 2020-03-12 DIAGNOSIS — Z23 Encounter for immunization: Secondary | ICD-10-CM | POA: Diagnosis not present

## 2020-03-12 NOTE — Progress Notes (Signed)
Shannon Roth     MRN: [id number]      DOB: January 17, 1966   HPI Shannon Roth is here for follow up and re-evaluation of chronic medical conditions,specifically hypertension, depression , anxiety and obesity. medication management and review of any available recent lab and radiology data.  Preventive health is updated, specifically  Cancer screening and Immunization.   Not currently being treated by Psychiatry or Psychology an feels as though she currently needs neither. Seeing the school psychologist intermittently. States she was told that I could certify her as having adult learning disability , I advise her that we will continue to reach out for an appt for her and this is not my training Is taking neitherthe fluoxetine or buspar prescribed,  States  fluoxetine  makes her get anxious/, not have as good an atitude ,  And  does not want to get out of bed    ROS Denies recent fever or chills. Denies sinus pressure, nasal congestion, ear pain or sore throat. Denies chest congestion, productive cough or wheezing. Denies chest pains, palpitations and leg swelling Denies abdominal pain, nausea, vomiting,diarrhea or constipation.   Denies dysuria, frequency, hesitancy or incontinence. Denies current depression,or uncontrolled  anxiety no interest in mental health care at this time Denies skin break down or rash.   PE  BP 138/84   Resp 16   Ht 5\' 8"  (1.727 m)   Wt (!) 331 lb (150.1 kg)   BMI 50.33 kg/m    Patient alert and oriented and in no cardiopulmonary distress.  HEENT: No facial asymmetry, EOMI,     Neck supple .  Chest: Clear to auscultation bilaterally.  CVS: S1, S2 no murmurs, no S3.Regular rate.  ABD: Soft non tender.   Ext: No edema  MS: Adequate ROM spine, shoulders, hips and knees.  Skin: Intact, no ulcerations or rash noted.  Psych: Good eye contact, normal affect. Memory intact not anxious or depressed appearing.  CNS: CN 2-12 intact, power,  normal  throughout.no focal deficits noted.   Assessment & Plan  Need for shingles vaccine After obtaining informed consent, the vaccine is  administered , with no adverse effect noted at the time of administration.   Essential hypertension Controlled, no change in medication DASH diet and commitment to daily physical activity for a minimum of 30 minutes discussed and encouraged, as a part of hypertension management. The importance of attaining a healthy weight is also discussed.  BP/Weight 03/12/2020 01/08/2020 12/18/2019 09/06/2019 08/09/2019 06/20/2019 60/03/9322  Systolic BP 557 322 025 427 062 376 283  Diastolic BP 84 92 86 76 78 82 85  Wt. (Lbs) 331 332.8 330 337.8 341 335 335.4  BMI 50.33 50.6 50.18 51.36 51.85 50.94 51       Morbid obesity  Patient re-educated about  the importance of commitment to a  minimum of 150 minutes of exercise per week as able.  The importance of healthy food choices with portion control discussed, as well as eating regularly and within a 12 hour window most days. The need to choose "clean , green" food 50 to 75% of the time is discussed, as well as to make water the primary drink and set a goal of 64 ounces water daily.    Weight /BMI 03/12/2020 01/08/2020 12/18/2019  WEIGHT 331 lb 332 lb 12.8 oz 330 lb  HEIGHT 5\' 8"  5\' 8"  5\' 8"   BMI 50.33 kg/m2 50.6 kg/m2 50.18 kg/m2      Back pain with left-sided radiculopathy  No report of recent flare of back pain  Depression, major, single episode, moderate (HCC) Negative screen at visit, though reports that she is on no medication, not in regular therapy and has no interest or need at this time for Psych care. Major current concern is evaluaion for adult learning disability

## 2020-03-12 NOTE — Patient Instructions (Signed)
F/U in early February, call if you need me before  Shingrix #2 today  Make appointment for flu vaccine in next 1 to 2 weeks please   Will follow up again on psychologist qualified to determine intellectual/ learning  disabilty, and get back to you  Blood pressure is good today

## 2020-03-13 ENCOUNTER — Other Ambulatory Visit: Payer: Self-pay | Admitting: *Deleted

## 2020-03-13 ENCOUNTER — Telehealth: Payer: Self-pay | Admitting: Family Medicine

## 2020-03-13 DIAGNOSIS — F819 Developmental disorder of scholastic skills, unspecified: Secondary | ICD-10-CM

## 2020-03-13 NOTE — Telephone Encounter (Signed)
Can you help me look at a referral

## 2020-03-13 NOTE — Telephone Encounter (Signed)
Noted referral looked at and remade to right provider

## 2020-03-14 ENCOUNTER — Encounter: Payer: Self-pay | Admitting: Psychology

## 2020-03-15 ENCOUNTER — Encounter: Payer: Self-pay | Admitting: Family Medicine

## 2020-03-15 NOTE — Assessment & Plan Note (Signed)
  Patient re-educated about  the importance of commitment to a  minimum of 150 minutes of exercise per week as able.  The importance of healthy food choices with portion control discussed, as well as eating regularly and within a 12 hour window most days. The need to choose "clean , green" food 50 to 75% of the time is discussed, as well as to make water the primary drink and set a goal of 64 ounces water daily.    Weight /BMI 03/12/2020 01/08/2020 12/18/2019  WEIGHT 331 lb 332 lb 12.8 oz 330 lb  HEIGHT 5\' 8"  5\' 8"  5\' 8"   BMI 50.33 kg/m2 50.6 kg/m2 50.18 kg/m2

## 2020-03-15 NOTE — Assessment & Plan Note (Signed)
Controlled, no change in medication DASH diet and commitment to daily physical activity for a minimum of 30 minutes discussed and encouraged, as a part of hypertension management. The importance of attaining a healthy weight is also discussed.  BP/Weight 03/12/2020 01/08/2020 12/18/2019 09/06/2019 08/09/2019 06/20/2019 46/80/3212  Systolic BP 248 250 037 048 889 169 450  Diastolic BP 84 92 86 76 78 82 85  Wt. (Lbs) 331 332.8 330 337.8 341 335 335.4  BMI 50.33 50.6 50.18 51.36 51.85 50.94 51

## 2020-03-15 NOTE — Assessment & Plan Note (Signed)
No report of recent flare of back pain

## 2020-03-15 NOTE — Assessment & Plan Note (Signed)
After obtaining informed consent, the vaccine is  administered , with no adverse effect noted at the time of administration.  

## 2020-03-15 NOTE — Assessment & Plan Note (Signed)
Negative screen at visit, though reports that she is on no medication, not in regular therapy and has no interest or need at this time for Psych care. Major current concern is evaluaion for adult learning disability

## 2020-03-21 ENCOUNTER — Other Ambulatory Visit: Payer: Self-pay

## 2020-03-21 ENCOUNTER — Encounter: Payer: Commercial Managed Care - PPO | Attending: Psychology | Admitting: Psychology

## 2020-03-21 DIAGNOSIS — F908 Attention-deficit hyperactivity disorder, other type: Secondary | ICD-10-CM | POA: Insufficient documentation

## 2020-03-21 DIAGNOSIS — F810 Specific reading disorder: Secondary | ICD-10-CM | POA: Insufficient documentation

## 2020-03-21 DIAGNOSIS — F819 Developmental disorder of scholastic skills, unspecified: Secondary | ICD-10-CM

## 2020-04-11 ENCOUNTER — Encounter: Payer: Self-pay | Admitting: Psychology

## 2020-04-11 NOTE — Progress Notes (Signed)
Neuropsychological Consultation   Patient:   Shannon Roth   DOB:   06-11-1965  MR Number:  [id number]  Location:  [facility] [place], [place] [id number] [place] [address] Dept: [phone]           Date of Service:   03/21/2020  Start Time:   2 PM End Time:   4 PM  Today's visit was an in person visit with the patient myself present.  It was conducted in my outpatient clinic office.  The first hour and 20 minutes were spent in face-to-face clinical interview and the other 40 minutes were spent with records review, testing battery design and report writing.  Provider/Observer:  Ilean Skill, Psy.D.       Clinical Neuropsychologist       Billing Code/Service: 96116/96121  Chief Complaint:    Shannon Roth is a 55 year old female referred by Tula Nakayama, MD for neuropsychological/psychoeducational assessment with concerns around basic attention and concentration deficits and lifelong difficulties with reading and spelling primarily.  The patient has had past educational experiences when she was in high school and [employer] where they read test materials to work to adjust for difficulties with reading and spelling.  The patient is now attending [employer] and is she is having a very hard time with her current classes because of these potential underlying learning disabilities and attentional deficits.  The patient needs objective/standardized measures to assess for objective findings of deficits and diagnostic considerations to allow for her to have accommodations performed in her college classes.  Reason for Service:  Shannon Roth is a 54 year old female referred by Tula Nakayama, MD for neuropsychological/psychoeducational assessment with concerns around basic attention and concentration deficits and lifelong difficulties with  reading and spelling primarily.  The patient has had past educational experiences when she was in high school and [employer] where they read test materials to work to adjust for difficulties with reading and spelling.  The patient is now attending [employer] and is she is having a very hard time with her current classes because of these potential underlying learning disabilities and attentional deficits.  The patient needs objective/standardized measures to assess for objective findings of deficits and diagnostic considerations to allow for her to have accommodations performed in her college classes.  The patient reports that she was always "slow in class" from the start of elementary school ongoing.  The patient reports that in the past she was diagnosed with learning disabilities and attentional deficits but this was quite some time ago.  There were significant adjustments made for her through high school and as well as her [employer] she attended.  The patient reports that spelling or reading are her primary deficits along with maintaining and sustaining attentional abilities.  The patient reports that she does fine on simple arithmetic types of skills but more complex mathematics are impaired due to attentional issues.  The patient is currently attending school and taking classes in accounting and she does fine with adding/subtracting and balancing equations.  The patient has been working 2 jobs and trying to go to school which then complicate her school activities.  The patient reports that when she is spelling that she tends to reverse letters and there have been considerations of possible underlying dyslexia as part of her learning disabilities.  The patient has been followed by Tula Nakayama, MD as  her PCP.  The patient has a past medical history including chronic medical conditions related to hypertension, depression, anxiety and obesity.  The patient has also seen  the school psychologist at [place] some but has been very intermittent and they were not able to complete any type of structured objective psychoeducational testing.  The patient has been prescribed fluoxetine and BuSpar in the past but has not been taking it and states that she gets anxious when she takes fluoxetine and does not have a good "attitude" and when she takes the BuSpar she has difficulty getting out of bed due to drowsiness.  The patient reports that developmentally she always had great difficulty with reading, writing, spelling and complex mathematical issues.  She did have some issues with early language development as well and has had issues with sustained attention and concentration capacity.  Behavioral Observation: Shannon Roth  presents as a 54 y.o.-year-old Right African American Female who appeared her stated age. her dress was Appropriate and she was Well Groomed and her manners were Appropriate to the situation.  her participation was indicative of Appropriate, Inattentive and Redirectable behaviors.  There were any physical disabilities noted.  she displayed an appropriate level of cooperation and motivation.     Interactions:    Active Appropriate and Redirectable  Attention:   abnormal and attention span appeared shorter than expected for age  Memory:   within normal limits; recent and remote memory intact  Visuo-spatial:  not examined  Speech (Volume):  normal  Speech:   normal; normal  Thought Process:  Coherent and Relevant  Though Content:  WNL; not suicidal and not homicidal  Orientation:   person, place, time/date and situation  Judgment:   Good  Planning:   Good  Affect:    Appropriate  Mood:    Dysphoric  Insight:   Good  Intelligence:   normal  Marital Status/Living: The patient reports that she was born and raised in [place] along with 4 siblings.  She currently lives alone.  The patient has not been married and has one 22 year old  child.  Current Employment: The patient is currently going to school and studying accounting classes that [employer].  She had been working 2 jobs prior to this but going to school and her jobs were too much to handle.  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  Substance Use:  No concerns of substance abuse are reported.    Education:   Patient graduated from high school attending [employer] and did report difficulties in academic settings throughout.  She also attended [employer] as well.  Medical History:   Past Medical History:  Diagnosis Date  . Arthritis   . Cough   . Migraine   . Migraine 2002  . Obesity   . Rectal bleeding        Psychiatric History:  The patient does have prior issues related to anxiety and depression and attentional issues.  Family Med/Psych History:  Family History  Problem Relation Age of Onset  . Asthma Mother   . Diabetes Mother   . Arthritis Mother   . Heart disease Mother   . Hypertension Father   . Emphysema Paternal Grandfather   . Emphysema Maternal Grandmother   . Lung cancer Maternal Grandmother   . Breast cancer Sister   . Stroke Sister   . Diabetes Brother   . Heart disease Brother   .  Colon cancer Paternal Uncle        79s  . Liver disease Neg Hx    Impression/DX:  Shannon Roth is a 54 year old female referred by Tula Nakayama, MD for neuropsychological/psychoeducational assessment with concerns around basic attention and concentration deficits and lifelong difficulties with reading and spelling primarily.  The patient has had past educational experiences when she was in high school and [employer] where they read test materials to work to adjust for difficulties with reading and spelling.  The patient is now attending [employer] and is she is having a very hard time with her current classes because of these potential underlying learning disabilities and  attentional deficits.  The patient needs objective/standardized measures to assess for objective findings of deficits and diagnostic considerations to allow for her to have accommodations performed in her college classes.  Disposition/Plan:  We have set the patient up for formal psychoeducational testing and other neuropsychological testing specifically around issues of potential adult residual attention deficit disorder.  The patient will be administered the Wechsler Adult Intelligence Scale-for as well as the Wechsler individual achievement test to objectively assess for potential underlying learning disabilities particularly in areas of reading and spelling.  We will also administer the patient to comprehensive attention battery and the CAB CPT measures to assess objectively various aspects of factors of attentional processing.  Once these objective measures are completed a formal neuropsychological report will be written with the conclusions of this objective testing and refine diagnostic criterion.  Feedback will be provided to the patient and a formal report will be made available to her PCP and also available to the patient to submit to [employer] with recommendations around accommodation.  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

## 2020-04-19 ENCOUNTER — Encounter: Payer: Self-pay | Admitting: Psychology

## 2020-04-19 ENCOUNTER — Other Ambulatory Visit: Payer: Self-pay

## 2020-04-19 ENCOUNTER — Encounter: Payer: Commercial Managed Care - PPO | Attending: Psychology | Admitting: Psychology

## 2020-04-19 DIAGNOSIS — F908 Attention-deficit hyperactivity disorder, other type: Secondary | ICD-10-CM | POA: Insufficient documentation

## 2020-04-19 DIAGNOSIS — F810 Specific reading disorder: Secondary | ICD-10-CM | POA: Insufficient documentation

## 2020-04-19 DIAGNOSIS — F8181 Disorder of written expression: Secondary | ICD-10-CM | POA: Diagnosis present

## 2020-04-21 NOTE — Progress Notes (Signed)
   Neuropsychology Note  Shannon Roth completed 360 minutes of neuropsychological testing with this provider. The patient did not appear overtly distressed by the testing session, per behavioral observation or via self-report. Rest breaks were offered.   Tests Administered:  Comprehensive Attention Battery  (CAB)  Continuous Performance Test (CPT)  Wechsler Individual Achievement Scale, 3rd Edition (WIAT-3)  Wechsler Adult Intelligence Scale, 4th Edition (WAIS-IV)  Shannon Roth will return within approximately 2 weeks for an interactive feedback session with Dr. Sima Matas at which time her test performances, clinical impressions and treatment recommendations will be reviewed in detail. The patient understands she can contact our office should she require our assistance before this time.  Full report to follow.

## 2020-04-24 ENCOUNTER — Other Ambulatory Visit: Payer: Self-pay

## 2020-04-24 ENCOUNTER — Encounter (HOSPITAL_BASED_OUTPATIENT_CLINIC_OR_DEPARTMENT_OTHER): Payer: Commercial Managed Care - PPO | Admitting: Psychology

## 2020-04-24 DIAGNOSIS — F810 Specific reading disorder: Secondary | ICD-10-CM

## 2020-04-24 DIAGNOSIS — F908 Attention-deficit hyperactivity disorder, other type: Secondary | ICD-10-CM | POA: Diagnosis not present

## 2020-04-24 DIAGNOSIS — F8181 Disorder of written expression: Secondary | ICD-10-CM

## 2020-05-01 ENCOUNTER — Other Ambulatory Visit: Payer: Self-pay

## 2020-05-01 ENCOUNTER — Encounter: Payer: Self-pay | Admitting: Psychology

## 2020-05-01 ENCOUNTER — Encounter: Payer: Self-pay | Admitting: Internal Medicine

## 2020-05-01 ENCOUNTER — Ambulatory Visit (HOSPITAL_COMMUNITY)
Admission: RE | Admit: 2020-05-01 | Discharge: 2020-05-01 | Disposition: A | Discharge status: Home / Self Care | Payer: Commercial Managed Care - PPO | Source: Ambulatory Visit | Attending: Internal Medicine | Admitting: Internal Medicine

## 2020-05-01 ENCOUNTER — Ambulatory Visit (INDEPENDENT_AMBULATORY_CARE_PROVIDER_SITE_OTHER): Payer: Commercial Managed Care - PPO | Admitting: Internal Medicine

## 2020-05-01 VITALS — BP 138/79 | HR 66 | Temp 98.5°F | Resp 20 | Ht 68.0 in | Wt 330.0 lb

## 2020-05-01 DIAGNOSIS — M25562 Pain in left knee: Secondary | ICD-10-CM | POA: Diagnosis not present

## 2020-05-01 DIAGNOSIS — E559 Vitamin D deficiency, unspecified: Secondary | ICD-10-CM

## 2020-05-01 MED ORDER — TRAMADOL HCL 50 MG PO TABS: 50.0000 mg | ORAL_TABLET | Freq: Two times a day (BID) | ORAL | 0 refills | Status: AC | PRN

## 2020-05-01 NOTE — Addendum Note (Signed)
Addended byIhor Dow on: 05/01/2020 03:41 PM   Modules accepted: Orders

## 2020-05-01 NOTE — Assessment & Plan Note (Signed)
Last vitamin D Lab Results  Component Value Date   VD25OH 10.0 (L) 01/08/2020   On Vit D 50,000 IU every week

## 2020-05-01 NOTE — Progress Notes (Addendum)
Established Patient Office Visit  Subjective:  Patient ID: Shannon Roth, female    DOB: 18-Apr-1966  Age: 54 y.o. MRN: [id number]  CC:  Chief Complaint  Patient presents with  . Leg Pain    x 1 week     HPI AVERIANA Roth is a 54 year old female with PMH of right knee OA, HTN, GERD and morbid obesity presents for evaluation of left knee pain.  Knee Pain: Patient presents with knee pain involving the  left knee. Onset of the symptoms was a week ago. Inciting event: none known. Current symptoms include pain located over medial and anterior aspect of the left knee and swelling. Pain is aggravated by any weight bearing and walking.  Patient has had prior knee problems, but on the right side. Evaluation to date: none for left knee. Treatment to date: OTC analgesics which are not very effective. She has tried Voltaren gel and Ibuprofen. She denies any numbness or tingling over the left knee. No claudication symptoms.   Past Medical History:  Diagnosis Date  . Arthritis   . Cough   . Migraine   . Migraine 2002  . Obesity   . Rectal bleeding     Past Surgical History:  Procedure Laterality Date  . bilateral BIOPSY BREAST  2002   benign  . Bilateral tubal ligation  2001  . BIOPSY  05/17/2018   Procedure: BIOPSY;  Surgeon: Danie Binder, MD;  Location: [facility];  Service: Endoscopy;;  duodenum and gastric  . CHOLECYSTECTOMY  2003  . COLONOSCOPY N/A 06/21/2015   SLF: hyperplastic polyps removed. next TCS 10 years  . ESOPHAGOGASTRODUODENOSCOPY (EGD) WITH PROPOFOL N/A 05/17/2018   Procedure: ESOPHAGOGASTRODUODENOSCOPY (EGD) WITH PROPOFOL;  Surgeon: Danie Binder, MD;  Location: [facility];  Service: Endoscopy;  Laterality: N/A;  1:45pm  . EYE SURGERY     right cataract removal   . GIVENS CAPSULE STUDY N/A 05/17/2018   Procedure: GIVENS CAPSULE STUDY;  Surgeon: Danie Binder, MD;  Location: [facility];  Service: Endoscopy;  Laterality: N/A;  . TOTAL ABDOMINAL  HYSTERECTOMY W/ BILATERAL SALPINGOOPHORECTOMY  2003  . TUBAL LIGATION      Family History  Problem Relation Age of Onset  . Asthma Mother   . Diabetes Mother   . Arthritis Mother   . Heart disease Mother   . Hypertension Father   . Emphysema Paternal Grandfather   . Emphysema Maternal Grandmother   . Lung cancer Maternal Grandmother   . Breast cancer Sister   . Stroke Sister   . Diabetes Brother   . Heart disease Brother   . Colon cancer Paternal Uncle        28s  . Liver disease Neg Hx     Social History   Socioeconomic History  . Marital status: Single    Spouse name: Not on file  . Number of children: 1  . Years of education: Not on file  . Highest education level: Not on file  Occupational History  . Occupation: Orthoptist  Tobacco Use  . Smoking status: Passive Smoke Exposure - Never Smoker  . Smokeless tobacco: Never Used  . Tobacco comment: Father & close friend  Substance and Sexual Activity  . Alcohol use: No    Alcohol/week: 0.0 standard drinks  . Drug use: No  . Sexual activity: Not on file  Other Topics Concern  . Not on file  Social History Narrative   Originally from [place]. 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 She reports that she wore a full body suit but questionable respiratory equipment for protection. No bird, mold, or hot tub exposure.    Social Determinants of Health   Financial Resource Strain:   . Difficulty of Paying Living Expenses: Not on file  Food Insecurity:   . Worried About Charity fundraiser in the Last Year: Not on file  . Ran Out of Food in the Last Year: Not on file  Transportation Needs:   . Lack of Transportation (Medical): Not on file  . Lack of Transportation (Non-Medical): Not on file  Physical Activity:   . Days of Exercise per Week: Not on file  . Minutes of Exercise per Session: Not on file  Stress:   . Feeling of Stress : Not on file  Social Connections:   . Frequency of  Communication with Friends and Family: Not on file  . Frequency of Social Gatherings with Friends and Family: Not on file  . Attends Religious Services: Not on file  . Active Member of Clubs or Organizations: Not on file  . Attends Archivist Meetings: Not on file  . Marital Status: Not on file  Intimate Partner Violence:   . Fear of Current or Ex-Partner: Not on file  . Emotionally Abused: Not on file  . Physically Abused: Not on file  . Sexually Abused: Not on file    Outpatient Medications Prior to Visit  Medication Sig Dispense Refill  . Biotin 10 MG CAPS Take 10 mg by mouth daily.    Marland Kitchen BLACK CURRANT SEED OIL PO Take by mouth daily.    . Cholecalciferol (VITAMIN D) 125 MCG (5000 UT) CAPS Take by mouth daily.    . COLLAGEN PO Take by mouth daily.    . cyclobenzaprine (FLEXERIL) 10 MG tablet Take one tablet at bedtime for back pain and spasm 30 tablet 3  . diclofenac sodium (VOLTAREN) 1 % GEL Apply 1 application topically as needed.     . dicyclomine (BENTYL) 10 MG capsule 1-2 PO 30 MINUTES PRIOR TO BREAKFAST 20 capsule 11  . ergocalciferol (VITAMIN D2) 1.25 MG (50000 UT) capsule Take 1 capsule (50,000 Units total) by mouth once a week. One capsule once weekly 12 capsule 2  . Flaxseed, Linseed, (FLAX SEEDS) POWD Take by mouth daily.    Marland Kitchen ibuprofen (ADVIL) 800 MG tablet Take 800 mg by mouth 2 (two) times daily as needed.    Marland Kitchen omeprazole (PRILOSEC) 20 MG capsule Take 1 capsule (20 mg total) by mouth daily. 90 capsule 3  . OVER THE COUNTER MEDICATION CBD oil if needed    . triamterene-hydrochlorothiazide (MAXZIDE-25) 37.5-25 MG tablet Take 1 tablet by mouth daily. 30 tablet 3  . XIFAXAN 550 MG TABS tablet Take 550 mg by mouth 3 (three) times daily.     No facility-administered medications prior to visit.    Allergies  Allergen Reactions  . Gabapentin   . Tramadol   . Codeine Nausea And Vomiting  . Penicillins Rash    ROS Review of Systems  Constitutional: Negative  for chills and fever.  HENT: Negative for congestion, sinus pressure, sinus pain and sore throat.   Eyes: Negative for pain and discharge.  Respiratory: Negative for cough and shortness of breath.   Cardiovascular: Negative for chest pain and palpitations.  Gastrointestinal: Negative for abdominal pain, constipation, diarrhea, nausea and vomiting.  Endocrine: Negative for polydipsia and polyuria.  Genitourinary: Negative for dysuria and hematuria.  Musculoskeletal: Positive for arthralgias and joint swelling. Negative for neck pain and neck stiffness.  Skin: Negative for rash.  Neurological: Negative for dizziness and weakness.  Psychiatric/Behavioral: Negative for agitation and behavioral problems.      Objective:    Physical Exam Vitals reviewed.  Constitutional:      General: She is not in acute distress.    Appearance: She is obese. She is not diaphoretic.  HENT:     Head: Normocephalic and atraumatic.     Nose: Nose normal. No congestion.     Mouth/Throat:     Mouth: Mucous membranes are moist.     Pharynx: No posterior oropharyngeal erythema.  Eyes:     General: No scleral icterus.    Extraocular Movements: Extraocular movements intact.     Pupils: Pupils are equal, round, and reactive to light.  Cardiovascular:     Rate and Rhythm: Normal rate and regular rhythm.     Heart sounds: No murmur heard.   Pulmonary:     Breath sounds: Normal breath sounds. No wheezing or rales.  Abdominal:     Palpations: Abdomen is soft.     Tenderness: There is no abdominal tenderness.  Musculoskeletal:        General: Swelling (Left knee, no warmth or erythema) and tenderness (Left knee, medial and anterior aspects) present.     Cervical back: Neck supple. No tenderness.  Skin:    General: Skin is warm.     Findings: No rash.  Neurological:     General: No focal deficit present.     Mental Status: She is alert and oriented to person, place, and time.  Psychiatric:        Mood  and Affect: Mood normal.        Behavior: Behavior normal.     BP 138/79   Pulse 66   Temp 98.5 F (36.9 C)   Resp 20   Ht 5\' 8"  (1.727 m)   Wt (!) 330 lb (149.7 kg)   SpO2 98%   BMI 50.18 kg/m  Wt Readings from Last 3 Encounters:  05/01/20 (!) 330 lb (149.7 kg)  03/12/20 (!) 331 lb (150.1 kg)  01/08/20 (!) 332 lb 12.8 oz (151 kg)     Health Maintenance Due  Topic Date Due  . INFLUENZA VACCINE  01/07/2020    There are no preventive care reminders to display for this patient.  Lab Results  Component Value Date   TSH 2.670 01/08/2020   Lab Results  Component Value Date   WBC 6.8 01/08/2020   HGB 11.9 01/08/2020   HCT 38.9 01/08/2020   MCV 75 (L) 01/08/2020   PLT 332 01/08/2020   Lab Results  Component Value Date   NA 142 01/08/2020   K 4.0 01/08/2020   CO2 22 01/08/2020   GLUCOSE 83 01/08/2020   BUN 14 01/08/2020   CREATININE 0.76 01/08/2020   BILITOT 0.3 01/08/2020   ALKPHOS 121 01/08/2020   AST 21 01/08/2020   ALT 20 01/08/2020   PROT 7.5 01/08/2020   ALBUMIN 4.4 01/08/2020   CALCIUM 9.5 01/08/2020   Lab Results  Component Value Date   CHOL 210 (H) 01/08/2020   Lab Results  Component Value Date   HDL 53 01/08/2020   Lab Results  Component Value Date   LDLCALC 137 (H) 01/08/2020   Lab Results  Component Value Date   TRIG 113 01/08/2020   Lab Results  Component Value Date   CHOLHDL 4.0 01/08/2020  Lab Results  Component Value Date   HGBA1C 5.6 01/08/2020      Assessment & Plan:   Problem List Items Addressed This Visit      Acute pain of left knee - Primary   Likely related to chronic degenerative changes - OA of left knee Has h/o right knee OA, gait change might be contributing to left knee OA now, has been using Voltaren gel and Ibuprofen Check X-ray of the left knee Continue Voltaren gel, advised to use heating pads Use of cane as support Tramadol for 10 days Referred to Orthopedic surgery after reviewing X-ray of the  left knee     Relevant Medications  traMADol (ULTRAM) 50 MG tablet  Other Relevant Orders  DG Knee Complete 4 Views Left    Other   Morbid obesity ([place])    Weight contributing to OA Advised to follow DASH diet and moderate exercise/walking once the acute knee pain gets better      Vitamin D deficiency    Last vitamin D Lab Results  Component Value Date   VD25OH 10.0 (L) 01/08/2020   On Vit D 50,000 IU every week         Meds ordered this encounter  Medications  . traMADol (ULTRAM) 50 MG tablet    Sig: Take 1 tablet (50 mg total) by mouth every 12 (twelve) hours as needed for up to 10 days for moderate pain or severe pain.    Dispense:  20 tablet    Refill:  0    Follow-up: Return if symptoms worsen or fail to improve.    Lindell Spar, MD

## 2020-05-01 NOTE — Progress Notes (Addendum)
[facility] Neuropsychology    NEUROPSYCHOLOGICAL EVALUATION - CONFIDENTIAL  STOP! If you are not the patient, the patient's legal guardian/caregiver, or an individual for whom the patient has signed a release of information then do not proceed and contact the above mentioned provider. Thank you!   PATIENT:    Shannon Roth DATE OF BIRTH:   1965/06/09 PROCEDURE:  Neuropsychological evaluation  DATE OF SERVICE:   04/24/2020 REFERRAL SOURCE:  Norwood Levo. 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       SOURCES OF INFORMATION: The following information was gathered from a clinical interview with patient and from a review of available medical records. The patient expressed understanding of the purpose of the evaluation and consented to all procedures.   CHIEF COMPLAINT: Shannon Roth is a 54 year old female referred by Tula Nakayama, MD for neuropsychological/psychoeducational assessment with concerns around basic attention and concentration deficits and lifelong difficulties with reading and spelling primarily.  The patient has had past educational experiences when she was in high school and [employer] where they read test materials to work to adjust for difficulties with reading and spelling.  The patient is now attending [employer] and is she is having a very hard time with her current classes because of these potential underlying learning disabilities and attentional deficits.  The patient needs objective/standardized measures to assess for objective findings of deficits and diagnostic considerations to allow for her to have accommodations performed in her college classes.  HISTORY OF PRESENT ILLNESS: Shannon Roth is a 54 year old female referred by Tula Nakayama, MD for  neuropsychological/psychoeducational assessment with concerns around basic attention and concentration deficits and lifelong difficulties with reading and spelling primarily.  The patient has had past educational experiences when she was in high school and [employer] where they read test materials to work to adjust for difficulties with reading and spelling.  The patient is now attending [employer] and is she is having a very hard time with her current classes because of these potential underlying learning disabilities and attentional deficits.  The patient needs objective/standardized measures to assess for objective findings of deficits and diagnostic considerations to allow for her to have accommodations performed in her college classes.  The patient reports that she was always "slow in class" from the start of elementary school ongoing.  The patient reports that in the past she was diagnosed with learning disabilities and attentional deficits but this was quite some time ago.  There were significant adjustments made for her through high school and as well as her bible college she attended.  The patient reports that spelling or reading are her primary deficits along with maintaining and sustaining attentional abilities.  The patient reports that she does fine on simple arithmetic types of skills but more complex mathematics are impaired due to attentional issues.  The patient is currently attending school and taking classes in accounting and she does fine with adding/subtracting and balancing equations.  The patient has been working 2 jobs and trying to go to school which then complicate her school activities.  The patient reports that when she is spelling that she tends to reverse letters and there have been considerations of possible underlying dyslexia as part of her learning disabilities.  The patient has been followed by Tula Nakayama, MD as her PCP.  The patient  has a  past medical history including chronic medical conditions related to hypertension, depression, anxiety and obesity.  The patient has also seen the school psychologist at [facility] some but has been very intermittent and they were not able to complete any type of structured objective psychoeducational testing.  The patient has been prescribed fluoxetine and BuSpar in the past but has not been taking it and states that she gets anxious when she takes fluoxetine and does not have a good "attitude" and when she takes the BuSpar she has difficulty getting out of bed due to drowsiness.  The patient reports that developmentally she always had great difficulty with reading, writing, spelling and complex mathematical issues.  She did have some issues with early language development as well and has had issues with sustained attention and concentration capacity.  MEDICAL HISTORY: Birth and developmental history are benign. Any history of traumatic brain injury was denied. According to medical records, the patient's problems list includes, but may not be limited, to the following:   Past Medical History:  Diagnosis Date  . Arthritis   . Cough   . Migraine   . Migraine 2002  . Obesity   . Rectal bleeding    CURRENT MEDICATIONS (According to medical records):  Current Outpatient Medications  Medication Sig Dispense Refill  . Biotin 10 MG CAPS Take 10 mg by mouth daily.    Marland Kitchen BLACK CURRANT SEED OIL PO Take by mouth daily.    . Cholecalciferol (VITAMIN D) 125 MCG (5000 UT) CAPS Take by mouth daily.    . COLLAGEN PO Take by mouth daily.    . cyclobenzaprine (FLEXERIL) 10 MG tablet Take one tablet at bedtime for back pain and spasm 30 tablet 3  . diclofenac sodium (VOLTAREN) 1 % GEL Apply 1 application topically as needed.     . dicyclomine (BENTYL) 10 MG capsule 1-2 PO 30 MINUTES PRIOR TO BREAKFAST 20 capsule 11  . ergocalciferol (VITAMIN D2) 1.25 MG (50000 UT) capsule Take 1 capsule (50,000 Units total) by mouth  once a week. One capsule once weekly 12 capsule 2  . Flaxseed, Linseed, (FLAX SEEDS) POWD Take by mouth daily.    Marland Kitchen ibuprofen (ADVIL) 800 MG tablet Take 800 mg by mouth 2 (two) times daily as needed.    Marland Kitchen omeprazole (PRILOSEC) 20 MG capsule Take 1 capsule (20 mg total) by mouth daily. 90 capsule 3  . OVER THE COUNTER MEDICATION CBD oil if needed    . traMADol (ULTRAM) 50 MG tablet Take 1 tablet (50 mg total) by mouth every 12 (twelve) hours as needed for up to 10 days for moderate pain or severe pain. 20 tablet 0  . triamterene-hydrochlorothiazide (MAXZIDE-25) 37.5-25 MG tablet Take 1 tablet by mouth daily. 30 tablet 3  . XIFAXAN 550 MG TABS tablet Take 550 mg by mouth 3 (three) times daily.     No current facility-administered medications for this visit.   PSYCHIATRIC HISTORY: Ms. Neidlinger denied ever being diagnosed with or treated for a mental health disorder, and the psychiatric history is purportedly benign with no prolonged periods of depression or anxiety ever experienced. Current mood was described as "frusterated".   There is no reported history of alcohol or illicit substance abuse/dependence. No suicidal/homicidal ideation, plan or intent endorsed. No manic or hypomanic episodes were reported. The patient denied ever experiencing any auditory/visual hallucinations. No behavioral or personality changes were endorsed.  PSYCHOSOCIAL HISTORY: There is indication of specific learning disability in reading and writing.  BEHAVIORAL OBSERVATIONS:   Ms. Montelongo was appropriately dressed for season and situation and appeared fairly groomed. Stature and height were unremarkable. Patient appeared obsese and chronological age. Sensory and motor abilities appeared normal. Patient was irritable and rapport was established. Speech was as expected. The patient was able to understand test directions. Mood was agitated and affect was mood congruent. Attention and motivation were good. Insight was intact.   Optimal test taking conditions were maintained.  VALIDITY OF EVALUATION: Scores on embedded measures of performance validity were within normal limits, and there were no behavioral manifestations that suggested suboptimal effort or poor test engagement. As such, the following test results are considered valid and interpretable.  Mental Status: The patient was alert and fully oriented to person, place, time, and situation.  Attention and concentration were as expected.  Fund of information was well below average.  Thinking was goal-directed and appeared normal from the perspective of productivity, relevance, and coherence with no preoccupations. The patient was able to form concepts relatively well.  Judgment and decision-making appeared intact. Insight was full.    TEST RESULTS:   A detailed score summary is provided within the Scores Summary Table at the end of this report, presented as standardized scores obtained through comparisons of the patient's performance to that of same age peers taking into account (whenever possible) the effects of age, education, and other demographic factors.   Baseline Intellectual Abilities:  The patient's premorbid abilities are estimated to be in the below average range based on educational and occupational history.  Composite Score Summary  Scale Sum of Scaled Scores Composite Score Percentile Rank 95% Conf. Interval Qualitative Description  Verbal Comprehension 20 VCI 81 10 76-87 Low Average  Perceptual Reasoning 21 PRI 82 12 77-89 Low Average  Working Memory 13 WMI 80 9 74-88 Low Average  Processing Speed 12 PSI 79 8 73-89 Borderline  Full Scale 66 FSIQ 77 6 73-82 Borderline  General Ability 41 GAI 80 9 76-86 Low Average   Current Intellectual Functioning:   The patient obtained a WAIS Full Scale IQ of 77, which fell in the 6th percentile and is in the borderline impaired range compared to age matched peers; this is slightly weaker than her estimated  premorbid intelligence. This composite value was comprised of 4 index scores representing major components of intelligence, which include verbal comprehension (VCI), perceptual reasoning (PRI), working memory ([place]), and processing speed (PSI). Her performance across subtests was highly consistent and showed no significant discrepancies between any particular domain.    The patient produced a verbal comprehension index score of 81 which falls at the 10th percentile and is in the below average range.  There was some variability in subtest scores.  The patient did well and performed in the average range on a measure of verbal reasoning and problem-solving while her basic vocabulary knowledge was below average. She had some weakness with regard to her general fund of information.   Verbal Comprehension Subtests Summary  Subtest Raw Score Scaled Score Percentile Rank Reference Group Scaled Score SEM  Similarities 21 8 25 8  1.08  Vocabulary 27 7 16 8  0.73  Information 6 5 5 6  0.67   The patient produced a perceptual reasoning index score of 82 which falls at the 12th percentile and is in the below average range. No variability was noted in subtest performance.  The patient did relatively well on measures of visuospatial analysis and construction.   Perceptual Reasoning Subtests Summary  Subtest Raw Score Scaled  Score Percentile Rank Reference Group Scaled Score SEM  Block Design 24 7 16 6  1.04  Matrix Reasoning 10 7 16 5  0.95  Visual Puzzles 8 7 16 6  0.99   The patient produced a working memory index score of 80 which falls at the 9th percentile and in the below average range.  The patient performed as expected on  measures of auditory encoding and multi-processing variables. Her basic arithmetic skills are commensurate to her basic auditory attention, which placed at the below average range for her.    Working Doctor, general practice Raw Score Scaled Score Percentile Rank Reference Group  Scaled Score SEM  Digit Span 19 6 9 5  0.85  Arithmetic 10 7 16 7  1.04   Immediate auditory attention was average, working memory capacity was borderline impaired, and mental sequencing was below average.   Working Counsellor Score Summary  Process Score Raw Score Scaled Score Percentile Rank Base Rate SEM  Digit Span Forward 9 9 37 -- 1.44  Digit Span Backward 4 5 5  -- 1.27  Digit Span Sequencing 6 7 16  -- 1.37  Longest Digit Span Forward 6 -- -- 75.5 --  Longest Digit Span Backward 2 -- -- 99.5 --  Longest Digit Span Sequence 4 -- -- 96.5 --   The patient produced a processing speed index score of 79 which falls at the 8th percentile and in the borderline impaired range.   Processing Speed Subtests Summary  Subtest Raw Score Scaled Score Percentile Rank Reference Group Scaled Score SEM  Symbol Search 15 5 5 4  1.31  Coding 44 7 16 5  0.99   Attention, Processing Speed, and Executive Cognitive Processes:   The patient was administered the Stroop interference cancellation test as part of the Comprehensive Attention Battery (CAB). This task is broken down into 8 separate trials. On the first four trials the patient is presented with a focus execute task that requires the patient to scan a 36-grid layout in which the words red, green or blue were randomly printed in each grid. Each of these color words and be printed in either red, green or blue color. On half of them, the word matches the color of the font and it is these that the patient is to identify where the color and word match. After the first four trials of this visual scanning measure change to four trials that include a Stroop interference component in which the words red, green and blue are played randomly over the speakers. On the first four "noninterference" trials the patient produced performances that were average and within normal limits. She correctly identified between 13 and 11 items on each of these trials (e.g., focus  execute ability). When the patient's task were changed to interference trials with targeted auditory distractors, her performance remained consistent overall. She had no problems inhibiting external distractors and was able to remain free of interference on focus execute trials.  The CAB CPT visual monitor measure, which is a 15-minute-long visual continuous performance measure.  This measure is broken down into five 3-minute blocks of time for analysis. The patient is presented with either the color red, green or blue every 2 seconds and every time the color red is presented the patient is to respond. On the first 3-minute block of time, the patient correctly identified 30 of 30 targets with 1 errors of commission and 0 errors of omission; average response time was 446 ms. These performances are good and at least equal to  or exceeding normative expectations on the first 3-minutes of this task.  The patient's performance continued to be average over the next four blocks of time.  Average response time remained consistent and by the last 3-minutes of this measure average response time was 455 ms, which is barely above first 3 min but not significantly different.  The results of this continues performance measure are not consistent with any deficits with regard to sustained attention and concentration and indicate good abilities to maintain attention over an extended period of time. She demonstrated average ability to remain free from external distractions or show significant fatigue or impairments with regard to sustained attention and concentration. It should be noted that this was a relatively stress-free environment and without notable external distractions present.   Academic Achievement    Composite Score Summary  Composite Sum of Subtest Standard Scores Standard Score 95% Confidence Interval Percentile Rank Normal Curve Equiv. Stanine Qualitative Description  Oral Language 165 80 72-88 9 22 2   Below Average  Total Reading 228 55 50-60 0.1 <1 1 Low  Basic Reading 90 40 34-46 <0.1 <1 1 Very Low  Reading Comprehension and Fluency 138 58 49-67 0.3 <1 1 Low  Written Expression 216 69 63-75 2 6 1  Low  Mathematics 183 90 85-95 25 36 4 Average  Math Fluency 270 89 82-96 23 35 4 Average  Total Achievement 728 66 62-70 1 2 1  Low    Subtest Raw Score Standard Score 95% Confidence Interval Percentile Rank Normal Curve Equiv. Stanine  Listening Comprehension - 83 72-94 13 26 3   Reading Comprehension 271 80 68-92 9 22 2   Math Problem Solving 50 86 78-94 18 30 3   Sentence Composition - 69 61-77 2 6 1   Word Reading 24 46 39-53 <0.1 <1 1  Essay Composition - 83 72-94 13 26 3   Pseudoword Decoding 3 44 35-53 <0.1 <1 1  Numerical Operations 37 97 91-103 42 46 5  Oral Expression - 82 72-92 12 25 3   Oral Reading Fluency 741,2 58 51-65 0.3 <1 1  Spelling 21 64 58-70 1 <1 1  Math Fluency-Addition 38 79 67-91 8 21 2   Math Fluency-Subtraction 39 96 87-105 39 44 4  Math Fluency-Multiplication 31 95 90-240 37 43 4   Subtest Score Summary  - Indicates a subtest with multiple raw scores (shown in the Subtest Component Score Summary).  1 Indicates a raw score that is converted to a weighted raw score (not shown).  2 Indicates that a raw score is based on a below grade level item set.   Supplemental Subtest Score Summary  Subtest Raw Score Standard Score 95% Confidence Interval Percentile Rank Normal Curve Equiv. Stanine Grade Equiv. Age Equiv.   Essay Composition: Museum/gallery conservator 12 70 58-82 2 8 1  N/A N/A   Oral Reading Accuracy 396* 62 47-77 1 <1 1 N/A N/A   Oral Reading Rate 323* 62 54-70 1 <1 1 N/A N/A   *Indicates a raw score that is converted to a weighted raw score (not shown).   Cumulative Percentages  Word Reading Speed The score is the same as or higher than the scores obtained by 10% of students in the normative sample; 90% of students in the normative sample  scored higher than this score.  Pseudoword Decoding Speed The score is the same as or higher than the scores obtained by 1% of students in the normative sample; 99% of students in the normative sample scored higher than this  score.    Area of Achievement Weakness WIAT-III Total Reading: 55  Area of Processing Weakness WAIS-IV PSI: 79  Area of Processing Strength WAIS-IV PRI: 82   Comparison Relative Strength Score Relative Weakness Score Difference Critical Value .01 Significant Difference Y/N Supports SLD hypothesis? Yes/No  A Processing Strength/ Achievement Weakness 82 55 27 10.95 Y Yes  B Processing Strength/ Processing Weakness 82 79 3 15.96 N No    Predicted Difference Method    Predicted WIAT-III Score Actual WIAT-III Score Difference Critical Value .01 Significant Difference Y/N Base Rate Standard Deviation Discrepancy > 1.0 SD  WIAT-III Subtest         Listening Comprehension 86 83 3 14.20 N >15% N  Reading Comprehension 85 80 5 16.71 N >15% N  Math Problem Solving 84 86 -2 10.89 N N/A N/A  Sentence Composition 88 69 19 13.41 Y <=10% Y  Word Reading 86 46 40 9.78 Y <=1% Y  Essay Composition 95 83 12 14.05 N >15% N  Essay Composition: Grammar and Mechanics 92 70 22 16.63 Y <=10% Y  Pseudoword Decoding 89 44 45 9.41 Y <=1% Y  Numerical Operations 87 97 -10 8.88 Y* N/A N/A  Oral Expression 87 82 5 13.58 N >15% N  Oral Reading Fluency 89 58 31 9.46 Y <=1% Y  Oral Reading Accuracy 90 62 28 19.99 Y <=5% Y  Oral Reading Rate 89 62 27 11.57 Y <=5% Y  Spelling 89 64 25 8.54 Y <=5% Y  Math Fluency-Addition 91 79 12 16.21 N >15% N  Math Fluency-Subtraction 91 96 -5 11.96 N N/A N/A  Math Fluency-Multiplication 91 95 -4 00.45 N N/A N/A  WIAT-III Composite         Oral Language 85 80 5 11.44 N >15% N  Total Reading 85 55 30 8.30 Y <=1% Y  Basic Reading 87 40 47 7.96 Y <=1% Y  Reading Comprehension and Fluency 87 58 29 11.78 Y <=1% Y  Written Expression 87 69 18 9.69  Y <=10% Y  Mathematics 85 90 -5 8.42 N N/A N/A  Math Fluency 91 89 2 9.19 N >15% N  Total Achievement 83 66 17 7.80 Y <=5% Y  Note. Base rates and standard deviation discrepancies are not reported when the actual achievement score equals or exceeds the predicted achievement score.  *Indicates that the actual achievement score exceeds the predicted achievement score.     SUMMARY & IMPRESSION:   Overall, the results of the patient's objective psychoeducational evaluation showed that she is performing in the borderline impaired to below average range on global cognitive measures with no objective findings of cognitive deficits, with the exception of a relative weakness in general fund of knowledge and aspects of information processing speed. Significant differences were found between her current intellectual functioning and specific academic skills such as reading (e.g., word reading accuracy and fluency), and written expression (e.g., spelling accuracy, grammar and punctuation accuracy, clarity of organization of written expression. Basic mathematical skills were intact and a relative strength.  She demonstrated average ability to remain free from external distractions or show significant fatigue or impairments with regard to sustained attention and concentration. She also had no problems inhibiting external distractors and was able to remain free of interference on focus execute trials; this is generally inconsistent with Adult residual ADHD.  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 Wass receive the following accommodations in educational settings:   Option to have tests read aloud by a "designated reader" to her in a quiet setting.   Permission to dictate answers to a scribe who writes or types on examinations  100% extra time for all assignments and examinations  Permission to tape-record lectures  Access to outlines of material to be covered prior to lectures, etc.  Use a word processor to type notes or give answers in class  She benefits from visual forms of instruction. Use visual presentations of verbal material, such as word webs when relevant/feasible.   She may benefit from additional speech and language therapy to address reading and writing concerns.   If you have any questions, please contact us at [phone].   This report is intended solely for the confidential review and use by the referring professional to assist in diagnostic and medical decision making needs.  This report should not be released to a third party without proper consent. [NOTE: data can be made available to qualified professionals with permission from the patient or legal representative/caregiver]    ____________________________________ Alfonso Ellis, PsyD,  Licensed Psychologist (Provisional) Clinical Neuropsychologist    Billing/Service Summary:   Neurobehavioral Status Exam:  Base: [phone] Add-on: [phone]  Direct clinical assessment (interview) of the patient and collateral interviews (as appropriate) by the licensed psychologist                                                                                      Total time: 120 minutes       Total units:  1 1  Neuropsychological Testing  Evaluation Services:   Base: [id number] Add-on: [phone]  Records review & clarify referral question; Patient symptom management; clinical decision making/battery modification; Integration/report generation; and, post-service work   Total time:  240 minutes  Total units:  1 3  Designer, fashion/clothing by Psychologist:  Base: [id number] Add-on: [phone]  Test Administration (face-to-face) Scoring (Non-face-to-face)                                                                                       Total time: 360 minutes      Total units:  1 9

## 2020-05-01 NOTE — Assessment & Plan Note (Signed)
Likely related to chronic degenerative changes - OA of left knee Has h/o right knee OA, gait change might be contributing to left knee OA now, has been using Voltaren gel and Ibuprofen Check X-ray of the left knee Continue Voltaren gel, advised to use heating pads Use of cane as support Tramadol for 10 days

## 2020-05-01 NOTE — Assessment & Plan Note (Signed)
Weight contributing to OA Advised to follow DASH diet and moderate exercise/walking once the acute knee pain gets better

## 2020-05-01 NOTE — Patient Instructions (Addendum)
Please avoid putting extra stress on the left knee.  Please apply Voltaren gel or heating pads on the left knee.  Please get X-ray of the left knee done.

## 2020-05-06 ENCOUNTER — Other Ambulatory Visit: Payer: Self-pay

## 2020-05-06 ENCOUNTER — Encounter (HOSPITAL_BASED_OUTPATIENT_CLINIC_OR_DEPARTMENT_OTHER): Payer: Commercial Managed Care - PPO | Admitting: Psychology

## 2020-05-06 DIAGNOSIS — F810 Specific reading disorder: Secondary | ICD-10-CM | POA: Diagnosis not present

## 2020-05-06 DIAGNOSIS — F8181 Disorder of written expression: Secondary | ICD-10-CM

## 2020-05-06 DIAGNOSIS — F908 Attention-deficit hyperactivity disorder, other type: Secondary | ICD-10-CM | POA: Diagnosis not present

## 2020-05-07 ENCOUNTER — Encounter: Payer: Self-pay | Admitting: Psychology

## 2020-05-07 NOTE — Progress Notes (Signed)
Neuropsychology Feedback Appointment  Shannon Roth returned for a feedback appointment today to review the results of her recent neuropsychological evaluation with this provider. 60 minutes face-to-face time was spent reviewing her test results, my impressions and my recommendations as detailed in her report. Education was provided about specific learning disorder with impairment in reading and written expression . The patient was given the opportunity to ask questions, and I did my best to answer these to her satisfaction. Final report with recommendations for academic accommodations was faxed to [facility] ([facility]).  Below you can find the summary and impressions/diagnoses from that formal evaluation. The complete report can be found in the patient's medical records dated 04/24/2020 and the full history and initial intake information can be found dated 03/21/2020    SUMMARY & IMPRESSION:   Overall, the results of the patient's objective psychoeducational evaluation showed that she is performing in the borderline impaired to below average range on global cognitive measures with no objective findings of cognitive deficits, with the exception of a relative weakness in general fund of knowledge and aspects of information processing speed. Significant differences were found between her current intellectual functioning and specific academic skills such as reading (e.g., word reading accuracy and fluency), and written expression (e.g., spelling accuracy, grammar and punctuation accuracy, clarity of organization of written expression. Basic mathematical skills were intact and a relative strength.  She demonstrated average ability to remain free from external distractions or show significant fatigue or impairments with regard to sustained attention and concentration. She also had no problems inhibiting external distractors and was able to remain free of interference on focus execute trials;  this is generally inconsistent with Adult residual ADHD.  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 receive the following accommodations in educational settings:   Option to have tests read aloud by a "designated reader" to her in a quiet setting.   Permission to dictate answers to a scribe who writes or types on examinations  100% extra time for all assignments and examinations  Permission to tape-record lectures  Access to outlines of material to be covered prior to lectures, etc.  Use a word processor to type notes or give answers in class  She benefits from visual forms of instruction. Use visual presentations of verbal material, such as word webs when relevant/feasible.   She may benefit from additional speech and language therapy to address reading and writing concerns.   If you have any questions, please contact us at [phone].   This report is intended solely for the confidential review and use by the referring professional to assist in diagnostic and medical decision making needs.  This report should not be released to a third party without proper consent. [NOTE: data can be made available to qualified professionals with permission from the patient or legal  representative/caregiver]    ____________________________________ Shannon Ellis, PsyD,  Licensed Psychologist (Provisional) Clinical Neuropsychologist  Billing/Service Summary:   Neurobehavioral Status Exam:  Base: [id number] Add-on: [id number]  Direct clinical assessment (interview) of the patient and collateral interviews (as appropriate) by the licensed psychologist                                                                                      Total time: 120 minutes       Total units:  1 1  Neuropsychological Testing Evaluation Services:   Base: [id number] Add-on: [phone]  Records review & clarify referral question; Patient symptom management; clinical decision making/battery modification; Integration/report generation; and, post-service work   Total time:  240 minutes  Total units:  1 3  Designer, fashion/clothing by Psychologist:  Base: [id number] Add-on: [phone]  Test Administration (face-to-face) Scoring (Non-face-to-face)                                                                                       Total time: 360 minutes      Total units:  1 9

## 2020-05-08 ENCOUNTER — Ambulatory Visit (INDEPENDENT_AMBULATORY_CARE_PROVIDER_SITE_OTHER): Payer: Commercial Managed Care - PPO | Admitting: Orthopedic Surgery

## 2020-05-08 ENCOUNTER — Encounter: Payer: Self-pay | Admitting: Orthopedic Surgery

## 2020-05-08 ENCOUNTER — Other Ambulatory Visit: Payer: Self-pay

## 2020-05-08 VITALS — BP 155/92 | HR 60 | Ht 68.0 in | Wt 330.0 lb

## 2020-05-08 DIAGNOSIS — M25562 Pain in left knee: Secondary | ICD-10-CM | POA: Diagnosis not present

## 2020-05-08 DIAGNOSIS — M879 Osteonecrosis, unspecified: Secondary | ICD-10-CM | POA: Diagnosis not present

## 2020-05-08 DIAGNOSIS — M84362A Stress fracture, left tibia, initial encounter for fracture: Secondary | ICD-10-CM

## 2020-05-08 MED ORDER — MELOXICAM 7.5 MG PO TABS: 7.5000 mg | ORAL_TABLET | Freq: Two times a day (BID) | ORAL | 5 refills | Status: DC

## 2020-05-08 NOTE — Progress Notes (Signed)
NEW PROBLEM//OFFICE VISIT  Chief Complaint  Patient presents with  . Knee Pain    left knee pain x 3 weeks     54 year old female presents with acute pain left knee started 3 weeks ago when she was in her normal state of health was walking down a sidewalk I believe on campus as she is in college, and felt acute pain in her left knee on the medial side.  She took some ibuprofen put some ice on it used some Voltaren gel it got better for a day or 2 and then it progressively worsened to the point where she required a walker toe ambulate  She complains of severe medial pain decreased range of motion and difficulty weightbearing   ROS Ringing in the ears joint pain headache chronic migraines otherwise normal all systems reviewed  Past Medical History:  Diagnosis Date  . Arthritis   . Cough   . Migraine   . Migraine 2002  . Obesity   . Rectal bleeding     Past Surgical History:  Procedure Laterality Date  . bilateral BIOPSY BREAST  2002   benign  . Bilateral tubal ligation  2001  . BIOPSY  05/17/2018   Procedure: BIOPSY;  Surgeon: Danie Binder, MD;  [facility];  Service: Endoscopy;;  duodenum and gastric  . CHOLECYSTECTOMY  2003  . COLONOSCOPY N/A 06/21/2015   SLF: hyperplastic polyps removed. next TCS 10 years  . ESOPHAGOGASTRODUODENOSCOPY (EGD) WITH PROPOFOL N/A 05/17/2018   Procedure: ESOPHAGOGASTRODUODENOSCOPY (EGD) WITH PROPOFOL;  Surgeon: Danie Binder, MD;  [facility];  Service: Endoscopy;  Laterality: N/A;  1:45pm  . EYE SURGERY     right cataract removal   . GIVENS CAPSULE STUDY N/A 05/17/2018   Procedure: GIVENS CAPSULE STUDY;  Surgeon: Danie Binder, MD;  [facility];  Service: Endoscopy;  Laterality: N/A;  . TOTAL ABDOMINAL HYSTERECTOMY W/ BILATERAL SALPINGOOPHORECTOMY  2003  . TUBAL LIGATION      Family History  Problem Relation Age of Onset  . Asthma Mother   . Diabetes Mother   . Arthritis Mother   . Heart disease  Mother   . Hypertension Father   . Emphysema Paternal Grandfather   . Emphysema Maternal Grandmother   . Lung cancer Maternal Grandmother   . Breast cancer Sister   . Stroke Sister   . Diabetes Brother   . Heart disease Brother   . Colon cancer Paternal Uncle        53s  . Liver disease Neg Hx    Social History   Tobacco Use  . Smoking status: Passive Smoke Exposure - Never Smoker  . Smokeless tobacco: Never Used  . Tobacco comment: Father & close friend  Substance Use Topics  . Alcohol use: No    Alcohol/week: 0.0 standard drinks  . Drug use: No    Allergies  Allergen Reactions  . Gabapentin   . Tramadol   . Codeine Nausea And Vomiting  . Penicillins Rash    Current Meds  Medication Sig  . Biotin 10 MG CAPS Take 10 mg by mouth daily.  Marland Kitchen BLACK CURRANT SEED OIL PO Take by mouth daily.  . Cholecalciferol (VITAMIN D) 125 MCG (5000 UT) CAPS Take by mouth daily.  . COLLAGEN PO Take by mouth daily.  . cyclobenzaprine (FLEXERIL) 10 MG tablet Take one tablet at bedtime for back pain and spasm  . diclofenac sodium (VOLTAREN) 1 % GEL Apply 1 application topically  as needed.   . dicyclomine (BENTYL) 10 MG capsule 1-2 PO 30 MINUTES PRIOR TO BREAKFAST  . ergocalciferol (VITAMIN D2) 1.25 MG (50000 UT) capsule Take 1 capsule (50,000 Units total) by mouth once a week. One capsule once weekly  . Flaxseed, Linseed, (FLAX SEEDS) POWD Take by mouth daily.  Marland Kitchen ibuprofen (ADVIL) 800 MG tablet Take 800 mg by mouth 2 (two) times daily as needed.  Marland Kitchen omeprazole (PRILOSEC) 20 MG capsule Take 1 capsule (20 mg total) by mouth daily.  Marland Kitchen OVER THE COUNTER MEDICATION CBD oil if needed  . traMADol (ULTRAM) 50 MG tablet Take 1 tablet (50 mg total) by mouth every 12 (twelve) hours as needed for up to 10 days for moderate pain or severe pain.  Marland Kitchen triamterene-hydrochlorothiazide (MAXZIDE-25) 37.5-25 MG tablet Take 1 tablet by mouth daily.  Marland Kitchen XIFAXAN 550 MG TABS tablet Take 550 mg by mouth 3 (three) times  daily.    BP (!) 155/92   Pulse 60   Ht 5\' 8"  (1.727 m)   Wt (!) 330 lb (149.7 kg)   BMI 50.18 kg/m   Physical Exam Vitals reviewed.  Constitutional:      Appearance: Normal appearance. She is obese.  Cardiovascular:     Pulses: Normal pulses.  Skin:    Capillary Refill: Capillary refill takes less than 2 seconds.  Neurological:     Mental Status: She is alert.  Psychiatric:        Mood and Affect: Mood normal.        Behavior: Behavior normal.        Thought Content: Thought content normal.        Judgment: Judgment normal.     Ortho Exam  Left knee she has a flexion contracture that is chronic she has pain with extension of the knee back to baseline she has tenderness over the medial joint line flexion arc is approximately active range of motion 10-110 passive 5-115 degrees We tried McMurray's test was difficult because of the size of the leg however it seemed positive the ligament seems stable  We noted tenderness over the proximal tibia  MEDICAL DECISION MAKING  A.  Encounter Diagnoses  Name Primary?  . Osteonecrosis of left knee region ([place]) Yes  . Acute pain of left knee   . Stress fracture of left tibia, initial encounter     B. DATA ANALYSED:   IMAGING: Interpretation of images: Outside images were interpreted.  She has osteoarthritis all 3 compartments this included 4 views of the knee   Orders: MRI left knee to rule out AVN versus meniscal tear versus stress fracture  Outside records reviewed: None   C. MANAGEMENT   Encounter Diagnoses  Name Primary?  . Osteonecrosis of left knee region ([place]) Yes  . Acute pain of left knee   . Stress fracture of left tibia, initial encounter      She is not tolerant to ibuprofen, not tolerant to hydrocodone or oxycodone  She did try ibuprofen  Meds ordered this encounter  Medications  . meloxicam (MOBIC) 7.5 MG tablet    Sig: Take 1 tablet (7.5 mg total) by mouth in the morning and at bedtime.     Dispense:  30 tablet    Refill:  5     Arther Abbott, MD  05/08/2020 9:29 AM

## 2020-05-08 NOTE — Patient Instructions (Signed)
Use the walker   Start mobic 1 twice daily for pain   Take tylenol 500 mg every 6 hours as well  Scheduling MRI

## 2020-05-15 ENCOUNTER — Other Ambulatory Visit: Payer: Self-pay

## 2020-05-15 ENCOUNTER — Ambulatory Visit (HOSPITAL_COMMUNITY)
Admission: RE | Admit: 2020-05-15 | Discharge: 2020-05-15 | Disposition: A | Discharge status: Home / Self Care | Payer: Commercial Managed Care - PPO | Source: Ambulatory Visit | Attending: Orthopedic Surgery | Admitting: Orthopedic Surgery

## 2020-05-15 DIAGNOSIS — M84362A Stress fracture, left tibia, initial encounter for fracture: Secondary | ICD-10-CM | POA: Insufficient documentation

## 2020-05-15 DIAGNOSIS — M25562 Pain in left knee: Secondary | ICD-10-CM | POA: Insufficient documentation

## 2020-05-23 ENCOUNTER — Encounter: Payer: Self-pay | Admitting: Orthopedic Surgery

## 2020-05-23 ENCOUNTER — Ambulatory Visit (INDEPENDENT_AMBULATORY_CARE_PROVIDER_SITE_OTHER): Payer: Commercial Managed Care - PPO | Admitting: Orthopedic Surgery

## 2020-05-23 ENCOUNTER — Other Ambulatory Visit: Payer: Self-pay

## 2020-05-23 VITALS — BP 159/92 | HR 62 | Ht 68.0 in | Wt 330.0 lb

## 2020-05-23 DIAGNOSIS — M23322 Other meniscus derangements, posterior horn of medial meniscus, left knee: Secondary | ICD-10-CM | POA: Diagnosis not present

## 2020-05-23 DIAGNOSIS — M879 Osteonecrosis, unspecified: Secondary | ICD-10-CM

## 2020-05-23 NOTE — Progress Notes (Signed)
Chief Complaint  Patient presents with  . Results    Review MRI left knee   . Knee Pain    left    Encounter Diagnosis  Name Primary?  . Osteonecrosis of left knee region [facility]) Yes   54 year old female with osteoarthrosis left knee, currently on Mobic.  She is not tolerant to ibuprofen hydrocodone or oxycodone she did try ibuprofen in the past  Her symptoms began when she was walking on campus felt acute pain in her left knee on the medial side she tried some ibuprofen and ice as well as some Voltaren gel but after 2 days the pain got worse  It was strongly felt that she had osteonecrosis of the left knee she presents for follow-up with an MRI scan  The MRI report is noted below I strongly believe after reviewing the MRI that she has a radial tear of her medial meniscus along with multiple compartment degenerative changes severe in the medial compartment as well as the patellofemoral joint  She says her knee is getting better  Exam shows tenderness over the medial joint line but she is gotten and restored full range of motion and no clicking popping or McMurray symptoms are present     IMPRESSION: 1. Complete or near-complete radial tear of the medial meniscal body. 2. Trace periligamentous edema superficial and deep to the MCL which may be reactive or reflective of a grade 1 sprain. 3. Tricompartmental osteoarthritis, most pronounced within the medial and patellofemoral compartments.     Electronically Signed   By: Davina Poke D.O.   On: 05/16/2020 12:01 CARTILAGE   Patellofemoral: High-grade chondral loss within the patellofemoral compartment including full-thickness chondral defects of the lateral patellar facet with associated subchondral cystic changes.   Medial: Mild-to-moderate chondral thinning and surface irregularity involving the weight-bearing medial compartment. There is also partial-thickness fissuring involving the posterior nonweightbearing medial  femoral condyle.   Lateral: Irregular partial-thickness chondral ulceration along the central aspect of the lateral femoral condyle (series 18, image 22).   Encounter Diagnoses  Name Primary?  . Osteonecrosis of left knee region ([place]) Yes  . Derangement of posterior horn of medial meniscus of left knee    Based on the fact that she is getting better we decided to wait on any surgical intervention if she gets worse she'll let us know if not she is fine to go

## 2020-05-23 NOTE — Patient Instructions (Signed)
You have a torn meniscus if it gets worse please call us back if it continues to get better than your good

## 2020-06-12 ENCOUNTER — Ambulatory Visit: Payer: Commercial Managed Care - PPO | Admitting: Psychology

## 2020-06-19 ENCOUNTER — Other Ambulatory Visit: Payer: Self-pay

## 2020-06-19 ENCOUNTER — Telehealth: Payer: Self-pay

## 2020-06-19 MED ORDER — TRIAMTERENE-HCTZ 37.5-25 MG PO TABS: 1.0000 | ORAL_TABLET | Freq: Every day | ORAL | 3 refills | Status: DC

## 2020-06-19 NOTE — Telephone Encounter (Signed)
Yes

## 2020-06-19 NOTE — Telephone Encounter (Signed)
Pt request a handicapped sticker. Ok to complete?

## 2020-06-19 NOTE — Telephone Encounter (Signed)
Form is in your box, with pts name on it

## 2020-06-19 NOTE — Telephone Encounter (Signed)
Can you put her name on a handicapped sticker and put it in my tray at my station and I'll complete

## 2020-06-19 NOTE — Telephone Encounter (Signed)
Patient needing a refill on her high blood pressure medication sent to [facility] in [place] and also a handicap placard form for 6 months p# [phone]

## 2020-06-26 ENCOUNTER — Ambulatory Visit
Admission: EM | Admit: 2020-06-26 | Discharge: 2020-06-26 | Disposition: A | Discharge status: Home / Self Care | Payer: Commercial Managed Care - PPO | Attending: Emergency Medicine | Admitting: Emergency Medicine

## 2020-06-26 ENCOUNTER — Encounter: Payer: Self-pay | Admitting: Emergency Medicine

## 2020-06-26 ENCOUNTER — Other Ambulatory Visit: Payer: Self-pay

## 2020-06-26 DIAGNOSIS — Z1152 Encounter for screening for COVID-19: Secondary | ICD-10-CM

## 2020-06-26 DIAGNOSIS — R112 Nausea with vomiting, unspecified: Secondary | ICD-10-CM | POA: Diagnosis not present

## 2020-06-26 DIAGNOSIS — J069 Acute upper respiratory infection, unspecified: Secondary | ICD-10-CM | POA: Diagnosis not present

## 2020-06-26 MED ORDER — ALBUTEROL SULFATE HFA 108 (90 BASE) MCG/ACT IN AERS: 1.0000 | INHALATION_SPRAY | Freq: Four times a day (QID) | RESPIRATORY_TRACT | 0 refills | Status: DC | PRN

## 2020-06-26 MED ORDER — PREDNISONE 10 MG PO TABS
20.0000 mg | ORAL_TABLET | Freq: Every day | ORAL | 0 refills | Status: DC
Start: 2020-06-26 — End: 2020-07-11

## 2020-06-26 MED ORDER — BENZONATATE 100 MG PO CAPS
100.0000 mg | ORAL_CAPSULE | Freq: Three times a day (TID) | ORAL | 0 refills | Status: DC | PRN
Start: 2020-06-26 — End: 2020-10-25

## 2020-06-26 MED ORDER — ONDANSETRON HCL 4 MG PO TABS: 4.0000 mg | ORAL_TABLET | Freq: Three times a day (TID) | ORAL | 0 refills | Status: DC | PRN

## 2020-06-26 NOTE — Discharge Instructions (Addendum)
COVID testing ordered.  It will take between 2-7 days for test results.  Someone will contact you regarding abnormal results.    Get plenty of rest and push fluids Tessalon Perles prescribed for cough/no more than 6 tabs in 24 hours Prednisone was prescribed ProAir was prescribed. Zofran prescribed for nausea Use medications daily for symptom relief Use OTC medications like ibuprofen or tylenol as needed fever or pain Call or go to the ED if you have any new or worsening symptoms such as fever, worsening cough, shortness of breath, chest tightness, chest pain, turning blue, changes in mental status, etc..Marland Kitchen

## 2020-06-26 NOTE — ED Provider Notes (Signed)
[place]   [id number] 06/26/20 Arrival Time: 1118   CC: COVID symptoms  SUBJECTIVE: History from: patient.  Shannon Roth is a 55 y.o. female who presented to the urgent care with a complaint of cough, body aches and vomiting for the past 4 to 5 days.  Denies sick exposure to COVID, flu or strep.  Denies recent travel.  Has tried OTC medication without relief.  Denies alleviating or aggravating factors.  Denies previous symptoms in the past.   Denies fever, chills, fatigue, sinus pain, rhinorrhea, sore throat, SOB, wheezing, chest pain, nausea, changes in bowel or bladder habits.    ROS: As per HPI.  All other pertinent ROS negative.     Past Medical History:  Diagnosis Date  . Arthritis   . Cough   . Migraine   . Migraine 2002  . Obesity   . Rectal bleeding    Past Surgical History:  Procedure Laterality Date  . bilateral BIOPSY BREAST  2002   benign  . Bilateral tubal ligation  2001  . BIOPSY  05/17/2018   Procedure: BIOPSY;  Surgeon: Danie Binder, MD;  [facility];  Service: Endoscopy;;  duodenum and gastric  . CHOLECYSTECTOMY  2003  . COLONOSCOPY N/A 06/21/2015   SLF: hyperplastic polyps removed. next TCS 10 years  . ESOPHAGOGASTRODUODENOSCOPY (EGD) WITH PROPOFOL N/A 05/17/2018   Procedure: ESOPHAGOGASTRODUODENOSCOPY (EGD) WITH PROPOFOL;  Surgeon: Danie Binder, MD;  [facility];  Service: Endoscopy;  Laterality: N/A;  1:45pm  . EYE SURGERY     right cataract removal   . GIVENS CAPSULE STUDY N/A 05/17/2018   Procedure: GIVENS CAPSULE STUDY;  Surgeon: Danie Binder, MD;  [facility];  Service: Endoscopy;  Laterality: N/A;  . TOTAL ABDOMINAL HYSTERECTOMY W/ BILATERAL SALPINGOOPHORECTOMY  2003  . TUBAL LIGATION     Allergies  Allergen Reactions  . Gabapentin   . Tramadol   . Codeine Nausea And Vomiting  . Penicillins Rash   No current facility-administered medications on file prior to encounter.   Current  Outpatient Medications on File Prior to Encounter  Medication Sig Dispense Refill  . Biotin 10 MG CAPS Take 10 mg by mouth daily.    Marland Kitchen BLACK CURRANT SEED OIL PO Take by mouth daily.    . Cholecalciferol (VITAMIN D) 125 MCG (5000 UT) CAPS Take by mouth daily.    . COLLAGEN PO Take by mouth daily.    . cyclobenzaprine (FLEXERIL) 10 MG tablet Take one tablet at bedtime for back pain and spasm 30 tablet 3  . diclofenac sodium (VOLTAREN) 1 % GEL Apply 1 application topically as needed.     . dicyclomine (BENTYL) 10 MG capsule 1-2 PO 30 MINUTES PRIOR TO BREAKFAST 20 capsule 11  . ergocalciferol (VITAMIN D2) 1.25 MG (50000 UT) capsule Take 1 capsule (50,000 Units total) by mouth once a week. One capsule once weekly 12 capsule 2  . Flaxseed, Linseed, (FLAX SEEDS) POWD Take by mouth daily.    Marland Kitchen ibuprofen (ADVIL) 800 MG tablet Take 800 mg by mouth 2 (two) times daily as needed.    . meloxicam (MOBIC) 7.5 MG tablet Take 1 tablet (7.5 mg total) by mouth in the morning and at bedtime. 30 tablet 5  . omeprazole (PRILOSEC) 20 MG capsule Take 1 capsule (20 mg total) by mouth daily. 90 capsule 3  . OVER THE COUNTER MEDICATION CBD oil if needed    . triamterene-hydrochlorothiazide (MAXZIDE-25) 37.5-25 MG tablet  Take 1 tablet by mouth daily. 30 tablet 3  . XIFAXAN 550 MG TABS tablet Take 550 mg by mouth 3 (three) times daily.     Social History   Socioeconomic History  . Marital status: Single    Spouse name: Not on file  . Number of children: 1  . Years of education: Not on file  . Highest education level: Not on file  Occupational History  . Occupation: Orthoptist  Tobacco Use  . Smoking status: Passive Smoke Exposure - Never Smoker  . Smokeless tobacco: Never Used  . Tobacco comment: Father & close friend  Vaping Use  . Vaping Use: Never used  Substance and Sexual Activity  . Alcohol use: No    Alcohol/week: 0.0 standard drinks  . Drug use: No  . Sexual activity: Not on file  Other Topics  Concern  . Not on file  Social History Narrative   Originally from [place]. 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 She reports that she wore a full body suit but questionable respiratory equipment for protection. No bird, mold, or hot tub exposure.    Social Determinants of Health   Financial Resource Strain: Not on file  Food Insecurity: Not on file  Transportation Needs: Not on file  Physical Activity: Not on file  Stress: Not on file  Social Connections: Not on file  Intimate Partner Violence: Not on file   Family History  Problem Relation Age of Onset  . Asthma Mother   . Diabetes Mother   . Arthritis Mother   . Heart disease Mother   . Hypertension Father   . Emphysema Paternal Grandfather   . Emphysema Maternal Grandmother   . Lung cancer Maternal Grandmother   . Breast cancer Sister   . Stroke Sister   . Diabetes Brother   . Heart disease Brother   . Colon cancer Paternal Uncle        54s  . Liver disease Neg Hx     OBJECTIVE:  Vitals:   06/26/20 1242 06/26/20 1243  BP: (!) 159/85   Pulse: 62   Resp: 18   Temp: 98 F (36.7 C)   TempSrc: Oral   SpO2: 98%   Weight:  (!) 315 lb (142.9 kg)  Height:  5\' 8"  (1.727 m)     General appearance: alert; appears fatigued, but nontoxic; speaking in full sentences and tolerating own secretions HEENT: NCAT; Ears: EACs clear, TMs pearly gray; Eyes: PERRL.  EOM grossly intact. Sinuses: nontender; Nose: nares patent without rhinorrhea, Throat: oropharynx clear, tonsils non erythematous or enlarged, uvula midline  Neck: supple without LAD Lungs: unlabored respirations, symmetrical air entry; cough: moderate; no respiratory distress; CTAB Heart: regular rate and rhythm.  Radial pulses 2+ symmetrical bilaterally Skin: warm and dry Psychological: alert and cooperative; normal mood and affect  LABS:  No results found for this or any previous visit (from the past 24 hour(s)).   ASSESSMENT & PLAN:  1.  Encounter for screening for COVID-19   2. Viral URI with cough   3. Non-intractable vomiting with nausea, unspecified vomiting type     Meds ordered this encounter  Medications  . benzonatate (TESSALON) 100 MG capsule    Sig: Take 1 capsule (100 mg total) by mouth 3 (three) times daily as needed for cough.    Dispense:  30 capsule    Refill:  0  . ondansetron (ZOFRAN) 4 MG tablet    Sig: Take 1 tablet (4 mg total)  by mouth every 8 (eight) hours as needed for nausea or vomiting.    Dispense:  20 tablet    Refill:  0  . albuterol (VENTOLIN HFA) 108 (90 Base) MCG/ACT inhaler    Sig: Inhale 1-2 puffs into the lungs every 6 (six) hours as needed for wheezing or shortness of breath.    Dispense:  18 g    Refill:  0  . predniSONE (DELTASONE) 10 MG tablet    Sig: Take 2 tablets (20 mg total) by mouth daily.    Dispense:  15 tablet    Refill:  0    Discharge instructions    COVID testing ordered.  It will take between 2-7 days for test results.  Someone will contact you regarding abnormal results.    Get plenty of rest and push fluids Tessalon Perles prescribed for cough/no more than 6 tabs in 24 hours Prednisone was prescribed ProAir was prescribed. Zofran prescribed for nausea Use medications daily for symptom relief Use OTC medications like ibuprofen or tylenol as needed fever or pain Call or go to the ED if you have any new or worsening symptoms such as fever, worsening cough, shortness of breath, chest tightness, chest pain, turning blue, changes in mental status, etc...   Reviewed expectations re: course of current medical issues. Questions answered. Outlined signs and symptoms indicating need for more acute intervention. Patient verbalized understanding. After Visit Summary given.         Emerson Monte, [place] 06/26/20 1344

## 2020-06-26 NOTE — ED Triage Notes (Signed)
Cough, vomiting, body aches since saturday

## 2020-06-26 NOTE — Telephone Encounter (Signed)
Copied Scanned Called patient will pick up form

## 2020-06-28 LAB — COVID-19, FLU A+B NAA
Influenza A, NAA: NOT DETECTED
Influenza B, NAA: NOT DETECTED
SARS-CoV-2, NAA: DETECTED — AB

## 2020-07-09 ENCOUNTER — Telehealth: Payer: Self-pay

## 2020-07-09 NOTE — Telephone Encounter (Signed)
Patient called asking for a antibody referral.  I offered mychart video and patient refused and hung the phone up.

## 2020-07-11 ENCOUNTER — Other Ambulatory Visit: Payer: Self-pay

## 2020-07-11 ENCOUNTER — Encounter: Payer: Self-pay | Admitting: Emergency Medicine

## 2020-07-11 ENCOUNTER — Ambulatory Visit
Admission: EM | Admit: 2020-07-11 | Discharge: 2020-07-11 | Disposition: A | Discharge status: Home / Self Care | Payer: Commercial Managed Care - PPO | Attending: Emergency Medicine | Admitting: Emergency Medicine

## 2020-07-11 DIAGNOSIS — H6593 Unspecified nonsuppurative otitis media, bilateral: Secondary | ICD-10-CM | POA: Diagnosis not present

## 2020-07-11 MED ORDER — PREDNISONE 10 MG PO TABS
10.0000 mg | ORAL_TABLET | Freq: Every day | ORAL | 0 refills | Status: AC
Start: 2020-07-11 — End: 2020-07-16

## 2020-07-11 MED ORDER — FLUTICASONE PROPIONATE 50 MCG/ACT NA SUSP
1.0000 | Freq: Every day | NASAL | 0 refills | Status: DC
Start: 2020-07-11 — End: 2021-11-12

## 2020-07-11 NOTE — ED Triage Notes (Signed)
For about 2 weeks pt has not been able to hear out of her right ear.

## 2020-07-11 NOTE — Discharge Instructions (Addendum)
Rest and drink plenty of fluids °Prescribed prednisone °Prescribed Flonase °Take medications as directed and to completion °Continue to use OTC ibuprofen and/ or tylenol as needed for pain control °Follow up with PCP if symptoms persists °Return here or go to the ER if you have any new or worsening symptoms °

## 2020-07-11 NOTE — ED Provider Notes (Addendum)
[place]   [id number] 07/11/20 Arrival Time: 1104  Chief Complaint  Patient presents with  . cant hear out of right ear     SUBJECTIVE: History from: patient.  Shannon Roth is a 55 y.o. female who presented to the urgent care for complaint of not be able to hear from the right ear for the past 2 weeks.  Has a URI symptom.  Denies ear pain at this time.  Has tried OTC medication without relief.  Denies alleviating or aggravating factors.    Denies fever, chills, fatigue, sinus pain, rhinorrhea, ear discharge, sore throat, SOB, wheezing, chest pain, nausea, changes in bowel or bladder habits.    ROS: As per HPI.  All other pertinent ROS negative.     Past Medical History:  Diagnosis Date  . Arthritis   . Cough   . Migraine   . Migraine 2002  . Obesity   . Rectal bleeding    Past Surgical History:  Procedure Laterality Date  . bilateral BIOPSY BREAST  2002   benign  . Bilateral tubal ligation  2001  . BIOPSY  05/17/2018   Procedure: BIOPSY;  Surgeon: Danie Binder, MD;  Location: [facility];  Service: Endoscopy;;  duodenum and gastric  . CHOLECYSTECTOMY  2003  . COLONOSCOPY N/A 06/21/2015   SLF: hyperplastic polyps removed. next TCS 10 years  . ESOPHAGOGASTRODUODENOSCOPY (EGD) WITH PROPOFOL N/A 05/17/2018   Procedure: ESOPHAGOGASTRODUODENOSCOPY (EGD) WITH PROPOFOL;  Surgeon: Danie Binder, MD;  Location: [facility];  Service: Endoscopy;  Laterality: N/A;  1:45pm  . EYE SURGERY     right cataract removal   . GIVENS CAPSULE STUDY N/A 05/17/2018   Procedure: GIVENS CAPSULE STUDY;  Surgeon: Danie Binder, MD;  Location: [facility];  Service: Endoscopy;  Laterality: N/A;  . TOTAL ABDOMINAL HYSTERECTOMY W/ BILATERAL SALPINGOOPHORECTOMY  2003  . TUBAL LIGATION     Allergies  Allergen Reactions  . Gabapentin   . Tramadol   . Codeine Nausea And Vomiting  . Penicillins Rash   No current facility-administered medications on file prior to encounter.    Current Outpatient Medications on File Prior to Encounter  Medication Sig Dispense Refill  . albuterol (VENTOLIN HFA) 108 (90 Base) MCG/ACT inhaler Inhale 1-2 puffs into the lungs every 6 (six) hours as needed for wheezing or shortness of breath. 18 g 0  . benzonatate (TESSALON) 100 MG capsule Take 1 capsule (100 mg total) by mouth 3 (three) times daily as needed for cough. 30 capsule 0  . Biotin 10 MG CAPS Take 10 mg by mouth daily.    Marland Kitchen BLACK CURRANT SEED OIL PO Take by mouth daily.    . Cholecalciferol (VITAMIN D) 125 MCG (5000 UT) CAPS Take by mouth daily.    . COLLAGEN PO Take by mouth daily.    . cyclobenzaprine (FLEXERIL) 10 MG tablet Take one tablet at bedtime for back pain and spasm 30 tablet 3  . diclofenac sodium (VOLTAREN) 1 % GEL Apply 1 application topically as needed.     . dicyclomine (BENTYL) 10 MG capsule 1-2 PO 30 MINUTES PRIOR TO BREAKFAST 20 capsule 11  . ergocalciferol (VITAMIN D2) 1.25 MG (50000 UT) capsule Take 1 capsule (50,000 Units total) by mouth once a week. One capsule once weekly 12 capsule 2  . Flaxseed, Linseed, (FLAX SEEDS) POWD Take by mouth daily.    Marland Kitchen ibuprofen (ADVIL) 800 MG tablet Take 800 mg by mouth 2 (two) times daily  as needed.    . meloxicam (MOBIC) 7.5 MG tablet Take 1 tablet (7.5 mg total) by mouth in the morning and at bedtime. 30 tablet 5  . omeprazole (PRILOSEC) 20 MG capsule Take 1 capsule (20 mg total) by mouth daily. 90 capsule 3  . ondansetron (ZOFRAN) 4 MG tablet Take 1 tablet (4 mg total) by mouth every 8 (eight) hours as needed for nausea or vomiting. 20 tablet 0  . OVER THE COUNTER MEDICATION CBD oil if needed    . triamterene-hydrochlorothiazide (MAXZIDE-25) 37.5-25 MG tablet Take 1 tablet by mouth daily. 30 tablet 3  . XIFAXAN 550 MG TABS tablet Take 550 mg by mouth 3 (three) times daily.     Social History   Socioeconomic History  . Marital status: Single    Spouse name: Not on file  . Number of children: 1  . Years of  education: Not on file  . Highest education level: Not on file  Occupational History  . Occupation: Orthoptist  Tobacco Use  . Smoking status: Passive Smoke Exposure - Never Smoker  . Smokeless tobacco: Never Used  . Tobacco comment: Father & close friend  Vaping Use  . Vaping Use: Never used  Substance and Sexual Activity  . Alcohol use: No    Alcohol/week: 0.0 standard drinks  . Drug use: No  . Sexual activity: Not on file  Other Topics Concern  . Not on file  Social History Narrative   Originally from [place]. 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 She reports that she wore a full body suit but questionable respiratory equipment for protection. No bird, mold, or hot tub exposure.    Social Determinants of Health   Financial Resource Strain: Not on file  Food Insecurity: Not on file  Transportation Needs: Not on file  Physical Activity: Not on file  Stress: Not on file  Social Connections: Not on file  Intimate Partner Violence: Not on file   Family History  Problem Relation Age of Onset  . Asthma Mother   . Diabetes Mother   . Arthritis Mother   . Heart disease Mother   . Hypertension Father   . Emphysema Paternal Grandfather   . Emphysema Maternal Grandmother   . Lung cancer Maternal Grandmother   . Breast cancer Sister   . Stroke Sister   . Diabetes Brother   . Heart disease Brother   . Colon cancer Paternal Uncle        77s  . Liver disease Neg Hx     OBJECTIVE:  Vitals:   07/11/20 1113  BP: (!) 160/91  Pulse: 76  Resp: 18  Temp: 98.3 F (36.8 C)  SpO2: 98%  Weight: (!) 327 lb (148.3 kg)     Physical Exam Vitals and nursing note reviewed.  Constitutional:      General: She is not in acute distress.    Appearance: Normal appearance. She is normal weight. She is not ill-appearing, toxic-appearing or diaphoretic.  HENT:     Head: Normocephalic.     Right Ear: Ear canal and external ear normal. A middle ear effusion is  present. There is no impacted cerumen.     Left Ear: Ear canal and external ear normal. A middle ear effusion is present. There is no impacted cerumen.  Cardiovascular:     Rate and Rhythm: Normal rate and regular rhythm.     Pulses: Normal pulses.     Heart sounds: Normal heart sounds. No murmur  heard. No friction rub. No gallop.   Pulmonary:     Effort: Pulmonary effort is normal. No respiratory distress.     Breath sounds: Normal breath sounds. No stridor. No wheezing, rhonchi or rales.  Chest:     Chest wall: No tenderness.  Neurological:     Mental Status: She is alert and oriented to person, place, and time.      Imaging: No results found.   ASSESSMENT & PLAN:  1. Middle ear effusion, bilateral     Meds ordered this encounter  Medications  . fluticasone (FLONASE) 50 MCG/ACT nasal spray    Sig: Place 1 spray into both nostrils daily for 14 days.    Dispense:  16 g    Refill:  0  . predniSONE (DELTASONE) 10 MG tablet    Sig: Take 1 tablet (10 mg total) by mouth daily for 5 days.    Dispense:  5 tablet    Refill:  0   Discharge instructions  Rest and drink plenty of fluids Prescribed prednisone Prescribed Flonase Take medications as directed and to completion Continue to use OTC ibuprofen and/ or tylenol as needed for pain control Follow up with PCP if symptoms persists Return here or go to the ER if you have any new or worsening symptoms   Reviewed expectations re: course of current medical issues. Questions answered. Outlined signs and symptoms indicating need for more acute intervention. Patient verbalized understanding. After Visit Summary given.         Emerson Monte, FNP 07/11/20 1135    Emerson Monte, FNP 07/11/20 1137

## 2020-08-07 ENCOUNTER — Encounter: Payer: Self-pay | Admitting: Internal Medicine

## 2020-10-08 ENCOUNTER — Encounter: Payer: Commercial Managed Care - PPO | Admitting: Family Medicine

## 2020-10-22 ENCOUNTER — Encounter: Payer: Commercial Managed Care - PPO | Admitting: Family Medicine

## 2020-10-25 ENCOUNTER — Other Ambulatory Visit (HOSPITAL_COMMUNITY)
Admission: RE | Admit: 2020-10-25 | Discharge: 2020-10-25 | Disposition: A | Discharge status: Home / Self Care | Payer: Commercial Managed Care - PPO | Source: Ambulatory Visit | Attending: Family Medicine | Admitting: Family Medicine

## 2020-10-25 ENCOUNTER — Other Ambulatory Visit: Payer: Self-pay

## 2020-10-25 ENCOUNTER — Encounter: Payer: Self-pay | Admitting: Family Medicine

## 2020-10-25 ENCOUNTER — Ambulatory Visit (INDEPENDENT_AMBULATORY_CARE_PROVIDER_SITE_OTHER): Payer: Commercial Managed Care - PPO | Admitting: Family Medicine

## 2020-10-25 VITALS — BP 155/87 | HR 65 | Resp 15 | Ht 68.0 in | Wt 334.0 lb

## 2020-10-25 DIAGNOSIS — Z124 Encounter for screening for malignant neoplasm of cervix: Secondary | ICD-10-CM

## 2020-10-25 DIAGNOSIS — D509 Iron deficiency anemia, unspecified: Secondary | ICD-10-CM

## 2020-10-25 DIAGNOSIS — R7989 Other specified abnormal findings of blood chemistry: Secondary | ICD-10-CM

## 2020-10-25 DIAGNOSIS — Z0001 Encounter for general adult medical examination with abnormal findings: Secondary | ICD-10-CM

## 2020-10-25 DIAGNOSIS — E559 Vitamin D deficiency, unspecified: Secondary | ICD-10-CM

## 2020-10-25 DIAGNOSIS — N649 Disorder of breast, unspecified: Secondary | ICD-10-CM

## 2020-10-25 DIAGNOSIS — Z0000 Encounter for general adult medical examination without abnormal findings: Secondary | ICD-10-CM

## 2020-10-25 DIAGNOSIS — I10 Essential (primary) hypertension: Secondary | ICD-10-CM | POA: Diagnosis not present

## 2020-10-25 DIAGNOSIS — Z1231 Encounter for screening mammogram for malignant neoplasm of breast: Secondary | ICD-10-CM | POA: Diagnosis not present

## 2020-10-25 DIAGNOSIS — E8881 Metabolic syndrome: Secondary | ICD-10-CM

## 2020-10-25 DIAGNOSIS — L988 Other specified disorders of the skin and subcutaneous tissue: Secondary | ICD-10-CM

## 2020-10-25 DIAGNOSIS — R7301 Impaired fasting glucose: Secondary | ICD-10-CM

## 2020-10-25 MED ORDER — ERGOCALCIFEROL 1.25 MG (50000 UT) PO CAPS: 50000.0000 [IU] | ORAL_CAPSULE | ORAL | 2 refills | Status: DC

## 2020-10-25 MED ORDER — SPIRONOLACTONE 50 MG PO TABS
50.0000 mg | ORAL_TABLET | Freq: Every day | ORAL | 3 refills | Status: DC
Start: 2020-10-25 — End: 2021-01-08

## 2020-10-25 MED ORDER — OMEPRAZOLE 20 MG PO CPDR
20.0000 mg | DELAYED_RELEASE_CAPSULE | Freq: Every day | ORAL | 3 refills | Status: DC
Start: 2020-10-25 — End: 2021-04-23

## 2020-10-25 NOTE — Assessment & Plan Note (Signed)
Right breast hypopigmented fleshy wartlike lesion at 8 o clock, irregular border, max diameter approx 2.5 cm, refer dermatology

## 2020-10-25 NOTE — Progress Notes (Signed)
Shannon Roth     MRN: [id number]      DOB: Sep 11, 1965  HPI: Patient is in for annual physical exam. Uncontrolled hypertension is addressed, reports intolerance of medication prescribed Immunization is reviewed , and  She will get covid booster next month   PE: BP (!) 155/87   Pulse 65   Resp 15   Ht 5\' 8"  (1.727 m)   Wt (!) 334 lb (151.5 kg)   SpO2 98%   BMI 50.78 kg/m   Pleasant  female, alert and oriented x 3, in no cardio-pulmonary distress. Afebrile. HEENT No facial trauma or asymetry. Sinuses non tender.  Extra occullar muscles intact.. External ears normal, . Neck: supple, no adenopathy,JVD or thyromegaly.No bruits.  Chest: Clear to ascultation bilaterally.No crackles or wheezes. Non tender to palpation  Breast: No asymetry,no masses or lumps. No tenderness. No nipple discharge or inversion. No axillary or supraclavicular adenopathy Hypopigmented fleshy flat wart with irregular border on right breast, max diameter approx 2 cm  Cardiovascular system; Heart sounds normal,  S1 and  S2 ,no S3.  No murmur, or thrill. Apical beat not displaced Peripheral pulses normal.  Abdomen: Soft, non tender, no organomegaly or masses. No bruits. Bowel sounds normal. No guarding, tenderness or rebound.   GU: External genitalia normal female genitalia , normal female distribution of hair. No lesions. Urethral meatus normal in size, no  Prolapse, no lesions visibly  Present. Bladder non tender. Vagina pink and moist , with no visible lesions , discharge present . Adequate pelvic support no  cystocele or rectocele noted  Uterus absent, no adnexal masses, no  adnexal tenderness.   Musculoskeletal exam: Decreased ROM of spine,  And knees, adequate in , shoulders .  deformity ,swelling and  crepitus noted In knees No muscle wasting or atrophy.   Neurologic: Cranial nerves 2 to 12 intact. Power, tone ,sensation and reflexes normal throughout.  disturbance in gait.  No tremor.  Skin: Intact, no ulceration, erythema , scaling or rash noted.  Psych; Normal mood and affect. Judgement and concentration normal   Assessment & Plan:  Essential hypertension Uncontrolled, cannot tolerate medication, start spironolactone 50 mg daily and re eval in 6 to 8 weeks DASH diet and commitment to daily physical activity for a minimum of 30 minutes discussed and encouraged, as a part of hypertension management. The importance of attaining a healthy weight is also discussed.  BP/Weight 10/25/2020 07/11/2020 06/26/2020 05/23/2020 05/08/2020 05/01/2020 37/11/2829  Systolic BP 517 616 073 710 626 948 546  Diastolic BP 87 91 85 92 92 79 84  Wt. (Lbs) 334 327 315 330 330 330 331  BMI 50.78 49.72 47.9 50.18 50.18 50.18 50.33       Annual physical exam Annual exam as documented. Counseling done  re healthy lifestyle involving commitment to 150 minutes exercise per week, heart healthy diet, and attaining healthy weight.The importance of adequate sleep also discussed. Regular seat belt use and home safety, is also discussed. Changes in health habits are decided on by the patient with goals and time frames  set for achieving them. Immunization and cancer screening needs are specifically addressed at this visit.   Morbid obesity  Patient re-educated about  the importance of commitment to a  minimum of 150 minutes of exercise per week as able.  The importance of healthy food choices with portion control discussed, as well as eating regularly and within a 12 hour window most days. The need to choose "clean , green"  food 50 to 75% of the time is discussed, as well as to make water the primary drink and set a goal of 64 ounces water daily.    Weight /BMI 10/25/2020 07/11/2020 06/26/2020  WEIGHT 334 lb 327 lb 315 lb  HEIGHT 5\' 8"  - 5\' 8"   BMI 50.78 kg/m2 49.72 kg/m2 47.9 kg/m2      Skin lesion of breast Right breast hypopigmented fleshy wartlike lesion at 8 o clock,  irregular border, max diameter approx 2.5 cm, refer dermatology

## 2020-10-25 NOTE — Assessment & Plan Note (Signed)

## 2020-10-25 NOTE — Patient Instructions (Addendum)
F/U in 6 to 8 weeks, re evaluate blood pressure, call if you need me sooner  New for blood pressure is spironolactone 50 mg 1 daily.  You are referred to dermatology and to opthalmology   New for vitamin D supplement is once weekly vitamin D.  Please schedule mammogram at checkout patient gets this at the breast center.  Labs today CBC lipids CMP and EGFR TSH hemoglobin A1c and vitamin D.  Please get COVID booster as discussed.  Please work on improved health through a commitment to regular physical activity 30 minutes 5 days/week and increasing fruit and vegetables fresh and frozen and reducing processed foods fried foods and sugary foods.  It is important that you exercise regularly at least 30 minutes 5 times a week. If you develop chest pain, have severe difficulty breathing, or feel very tired, stop exercising immediately and seek medical attention   Think about what you will eat, plan ahead. Choose " clean, green, fresh or frozen" over canned, processed or packaged foods which are more sugary, salty and fatty. 70 to 75% of food eaten should be vegetables and fruit. Three meals at set times with snacks allowed between meals, but they must be fruit or vegetables. Aim to eat over a 12 hour period , example 7 am to 7 pm, and STOP after  your last meal of the day. Drink water,generally about 64 ounces per day, no other drink is as healthy. Fruit juice is best enjoyed in a healthy way, by EATING the fruit.  Thanks for choosing [facility], we consider it a privelige to serve you.

## 2020-10-25 NOTE — Assessment & Plan Note (Signed)
  Patient re-educated about  the importance of commitment to a  minimum of 150 minutes of exercise per week as able.  The importance of healthy food choices with portion control discussed, as well as eating regularly and within a 12 hour window most days. The need to choose "clean , green" food 50 to 75% of the time is discussed, as well as to make water the primary drink and set a goal of 64 ounces water daily.    Weight /BMI 10/25/2020 07/11/2020 06/26/2020  WEIGHT 334 lb 327 lb 315 lb  HEIGHT 5\' 8"  - 5\' 8"   BMI 50.78 kg/m2 49.72 kg/m2 47.9 kg/m2

## 2020-10-25 NOTE — Assessment & Plan Note (Signed)
Uncontrolled, cannot tolerate medication, start spironolactone 50 mg daily and re eval in 6 to 8 weeks DASH diet and commitment to daily physical activity for a minimum of 30 minutes discussed and encouraged, as a part of hypertension management. The importance of attaining a healthy weight is also discussed.  BP/Weight 10/25/2020 07/11/2020 06/26/2020 05/23/2020 05/08/2020 05/01/2020 32/02/5187  Systolic BP 416 606 301 601 093 235 573  Diastolic BP 87 91 85 92 92 79 84  Wt. (Lbs) 334 327 315 330 330 330 331  BMI 50.78 49.72 47.9 50.18 50.18 50.18 50.33

## 2020-10-26 LAB — CBC
Hematocrit: 38.9 % (ref 34.0–46.6)
Hemoglobin: 11.6 g/dL (ref 11.1–15.9)
MCH: 22.7 pg — ABNORMAL LOW (ref 26.6–33.0)
MCHC: 29.8 g/dL — ABNORMAL LOW (ref 31.5–35.7)
MCV: 76 fL — ABNORMAL LOW (ref 79–97)
Platelets: 328 10*3/uL (ref 150–450)
RBC: 5.12 x10E6/uL (ref 3.77–5.28)
RDW: 14.1 % (ref 11.7–15.4)
WBC: 6.9 10*3/uL (ref 3.4–10.8)

## 2020-10-26 LAB — CMP14+EGFR
ALT: 19 IU/L (ref 0–32)
AST: 15 IU/L (ref 0–40)
Albumin/Globulin Ratio: 1.3 (ref 1.2–2.2)
Albumin: 4.4 g/dL (ref 3.8–4.9)
Alkaline Phosphatase: 110 IU/L (ref 44–121)
BUN/Creatinine Ratio: 11 (ref 9–23)
BUN: 9 mg/dL (ref 6–24)
Bilirubin Total: 0.3 mg/dL (ref 0.0–1.2)
CO2: 22 mmol/L (ref 20–29)
Calcium: 9.7 mg/dL (ref 8.7–10.2)
Chloride: 103 mmol/L (ref 96–106)
Creatinine, Ser: 0.8 mg/dL (ref 0.57–1.00)
Globulin, Total: 3.3 g/dL (ref 1.5–4.5)
Glucose: 96 mg/dL (ref 65–99)
Potassium: 3.7 mmol/L (ref 3.5–5.2)
Sodium: 143 mmol/L (ref 134–144)
Total Protein: 7.7 g/dL (ref 6.0–8.5)
eGFR: 87 mL/min/{1.73_m2} (ref >59)

## 2020-10-26 LAB — TSH: TSH: 1.97 u[IU]/mL (ref 0.450–4.500)

## 2020-10-26 LAB — HEMOGLOBIN A1C
Est. average glucose Bld gHb Est-mCnc: 111 mg/dL
Hgb A1c MFr Bld: 5.5 % (ref 4.8–5.6)

## 2020-10-26 LAB — LIPID PANEL
Chol/HDL Ratio: 4 ratio (ref 0.0–4.4)
Cholesterol, Total: 184 mg/dL (ref 100–199)
HDL: 46 mg/dL (ref >39)
LDL Chol Calc (NIH): 116 mg/dL — ABNORMAL HIGH (ref 0–99)
Triglycerides: 121 mg/dL (ref 0–149)
VLDL Cholesterol Cal: 22 mg/dL (ref 5–40)

## 2020-10-26 LAB — VITAMIN D 25 HYDROXY (VIT D DEFICIENCY, FRACTURES): Vit D, 25-Hydroxy: 10.3 ng/mL — ABNORMAL LOW (ref 30.0–100.0)

## 2020-10-28 LAB — CYTOLOGY - PAP
Comment: NEGATIVE
Diagnosis: NEGATIVE
High risk HPV: NEGATIVE

## 2020-10-31 ENCOUNTER — Telehealth: Payer: Self-pay | Admitting: Dermatology

## 2020-10-31 NOTE — Telephone Encounter (Signed)
Patient is calling for a referral appointment from Tula Nakayama, MD.  Patient does not want to wait until 04/2021 for appointment, so would like referral sent back to Dr. Moshe Cipro.

## 2020-11-11 NOTE — Telephone Encounter (Signed)
Notes documented and referral routed back to referring office. 

## 2020-12-10 ENCOUNTER — Ambulatory Visit: Payer: Commercial Managed Care - PPO | Admitting: Family Medicine

## 2020-12-18 ENCOUNTER — Ambulatory Visit: Payer: Commercial Managed Care - PPO | Admitting: Family Medicine

## 2021-01-08 ENCOUNTER — Ambulatory Visit (INDEPENDENT_AMBULATORY_CARE_PROVIDER_SITE_OTHER): Payer: Commercial Managed Care - PPO | Admitting: Family Medicine

## 2021-01-08 ENCOUNTER — Encounter: Payer: Self-pay | Admitting: Family Medicine

## 2021-01-08 ENCOUNTER — Other Ambulatory Visit: Payer: Self-pay

## 2021-01-08 VITALS — BP 143/84 | HR 64 | Resp 16 | Ht 68.0 in | Wt 332.0 lb

## 2021-01-08 DIAGNOSIS — Z1231 Encounter for screening mammogram for malignant neoplasm of breast: Secondary | ICD-10-CM | POA: Diagnosis not present

## 2021-01-08 DIAGNOSIS — I10 Essential (primary) hypertension: Secondary | ICD-10-CM

## 2021-01-08 DIAGNOSIS — J028 Acute pharyngitis due to other specified organisms: Secondary | ICD-10-CM

## 2021-01-08 DIAGNOSIS — J309 Allergic rhinitis, unspecified: Secondary | ICD-10-CM

## 2021-01-08 DIAGNOSIS — F419 Anxiety disorder, unspecified: Secondary | ICD-10-CM

## 2021-01-08 MED ORDER — AZITHROMYCIN 250 MG PO TABS: ORAL_TABLET | ORAL | 0 refills | Status: AC

## 2021-01-08 MED ORDER — PREDNISONE 5 MG PO TABS: 5.0000 mg | ORAL_TABLET | Freq: Two times a day (BID) | ORAL | 0 refills | Status: AC

## 2021-01-08 MED ORDER — FLUCONAZOLE 150 MG PO TABS: 150.0000 mg | ORAL_TABLET | Freq: Once | ORAL | 0 refills | Status: AC

## 2021-01-08 MED ORDER — SPIRONOLACTONE 100 MG PO TABS: 100.0000 mg | ORAL_TABLET | Freq: Every day | ORAL | 1 refills | Status: DC

## 2021-01-08 NOTE — Patient Instructions (Addendum)
    F/U in 8 to 10 weeks, flu vaccine at visit   Please schedule mammogram at checkout, any day next week and any time, otherwise by 8 am on any weekday, has n morning classes in [place]  Z pack and fluconazole are prescribed for sore throat  5 day course of prednisone is sent for sinus drainage   Please get Covid booster  Dose increase in blood pressure medicine starting today, take spironolactone 100 mg once daily, new script is sent, and you may also take TWO 50 mg tablets together every day till done  It is important that you exercise regularly at least 30 minutes 5 times a week. If you develop chest pain, have severe difficulty breathing, or feel very tired, stop exercising immediately and seek medical attention  Think about what you will eat, plan ahead. Choose " clean, green, fresh or frozen" over canned, processed or packaged foods which are more sugary, salty and fatty. 70 to 75% of food eaten should be vegetables and fruit. Three meals at set times with snacks allowed between meals, but they must be fruit or vegetables. Aim to eat over a 12 hour period , example 7 am to 7 pm, and STOP after  your last meal of the day. Drink water,generally about 64 ounces per day, no other drink is as healthy. Fruit juice is best enjoyed in a healthy way, by EATING the fruit.

## 2021-01-12 ENCOUNTER — Encounter: Payer: Self-pay | Admitting: Family Medicine

## 2021-01-12 DIAGNOSIS — J309 Allergic rhinitis, unspecified: Secondary | ICD-10-CM | POA: Insufficient documentation

## 2021-01-12 DIAGNOSIS — J029 Acute pharyngitis, unspecified: Secondary | ICD-10-CM | POA: Insufficient documentation

## 2021-01-12 NOTE — Assessment & Plan Note (Signed)
  Patient re-educated about  the importance of commitment to a  minimum of 150 minutes of exercise per week as able.  The importance of healthy food choices with portion control discussed, as well as eating regularly and within a 12 hour window most days. The need to choose "clean , green" food 50 to 75% of the time is discussed, as well as to make water the primary drink and set a goal of 64 ounces water daily.    Weight /BMI 01/08/2021 10/25/2020 07/11/2020  WEIGHT 332 lb 334 lb 327 lb  HEIGHT '5\' 8"'$  '5\' 8"'$  -  BMI 50.48 kg/m2 50.78 kg/m2 49.72 kg/m2

## 2021-01-12 NOTE — Progress Notes (Signed)
   Shannon Roth     MRN: [id number]      DOB: Sep 07, 1965   HPI [name] is here for follow up and re-evaluation of chronic medical conditions, medication management and review of any available recent lab and radiology data.  Preventive health is updated, specifically  Cancer screening and Immunization.   Questions or concerns regarding consultations or procedures which the PT has had in the interim are  addressed. The PT denies any adverse reactions to current medications since the last visit.  2 day h/o sore throat with positive sick exposure  ROS Denies recent fever or chills. Denies sinus pressure, c/o increased sinus drainage an  nasal congestion,  denies ear pain  Denies chest congestion, productive cough or wheezing. Denies chest pains, palpitations and leg swelling Denies abdominal pain, nausea, vomiting,diarrhea or constipation.   Denies dysuria, frequency, hesitancy or incontinence. Denies joint pain, swelling and limitation in mobility. Denies headaches, seizures, numbness, or tingling. Denies depression, anxiety or insomnia. Denies skin break down or rash.   PE  BP (!) 143/84   Pulse 64   Resp 16   Ht '5\' 8"'$  (1.727 m)   Wt (!) 332 lb (150.6 kg)   BMI 50.48 kg/m   Patient alert and oriented and in no cardiopulmonary distress.  HEENT: No facial asymmetry, EOMI,     Neck supple .nasal mucosa edematous, with excess post nasal drip sinuses mildly  tender Erythema , no exudate of oropharynx  Chest: Clear to auscultation bilaterally.  CVS: S1, S2 no murmurs, no S3.Regular rate.  ABD: Soft non tender.   Ext: No edema  Though reduced ROM spine, shoulders, hips and knees.  Skin: Intact, no ulcerations or rash noted.  Psych: Good eye contact, normal affect. Memory intact not anxious or depressed appearing.  CNS: CN 2-12 intact, power,  normal throughout.no focal deficits noted.   Assessment & Plan  Pharyngitis Z apck, and fluconazole if needed are prescribed   Salt water gargles and voice rest recommended  Morbid obesity  Patient re-educated about  the importance of commitment to a  minimum of 150 minutes of exercise per week as able.  The importance of healthy food choices with portion control discussed, as well as eating regularly and within a 12 hour window most days. The need to choose "clean , green" food 50 to 75% of the time is discussed, as well as to make water the primary drink and set a goal of 64 ounces water daily.    Weight /BMI 01/08/2021 10/25/2020 07/11/2020  WEIGHT 332 lb 334 lb 327 lb  HEIGHT '5\' 8"'$  '5\' 8"'$  -  BMI 50.48 kg/m2 50.78 kg/m2 49.72 kg/m2      Essential hypertension Controlled, no change in medication DASH diet and commitment to daily physical activity for a minimum of 30 minutes discussed and encouraged, as a part of hypertension management. The importance of attaining a healthy weight is also discussed.  BP/Weight 01/08/2021 10/25/2020 07/11/2020 06/26/2020 05/23/2020 05/08/2020 [id number]  Systolic BP A999333 [id number] 0000000  Diastolic BP 84 87 91 85 92 92 79  Wt. (Lbs) 332 334 327 315 330 330 330  BMI 50.48 50.78 49.72 47.9 50.18 50.18 50.18       Anxiety Improved ,  Making progress ih her academic pursuit  Allergic sinusitis Increased nasal congestion andpost nasal drainage , short course of prednisone prescribed

## 2021-01-12 NOTE — Assessment & Plan Note (Signed)
Controlled, no change in medication DASH diet and commitment to daily physical activity for a minimum of 30 minutes discussed and encouraged, as a part of hypertension management. The importance of attaining a healthy weight is also discussed.  BP/Weight 01/08/2021 10/25/2020 07/11/2020 06/26/2020 05/23/2020 05/08/2020 [id number]  Systolic BP A999333 [id number] 0000000 [id number] 0000000  Diastolic BP 84 87 91 85 92 92 79  Wt. (Lbs) 332 334 327 315 330 330 330  BMI 50.48 50.78 49.72 47.9 50.18 50.18 50.18

## 2021-01-12 NOTE — Assessment & Plan Note (Signed)
Z apck, and fluconazole if needed are prescribed  Salt water gargles and voice rest recommended

## 2021-01-12 NOTE — Assessment & Plan Note (Signed)
Increased nasal congestion andpost nasal drainage , short course of prednisone prescribed

## 2021-01-12 NOTE — Assessment & Plan Note (Signed)
Improved ,  Making progress ih her academic pursuit

## 2021-01-28 ENCOUNTER — Ambulatory Visit
Admission: EM | Admit: 2021-01-28 | Discharge: 2021-01-28 | Disposition: A | Discharge status: Home / Self Care | Payer: Commercial Managed Care - PPO | Attending: Emergency Medicine | Admitting: Emergency Medicine

## 2021-01-28 ENCOUNTER — Encounter: Payer: Self-pay | Admitting: Emergency Medicine

## 2021-01-28 ENCOUNTER — Other Ambulatory Visit: Payer: Self-pay

## 2021-01-28 DIAGNOSIS — S161XXA Strain of muscle, fascia and tendon at neck level, initial encounter: Secondary | ICD-10-CM | POA: Diagnosis not present

## 2021-01-28 DIAGNOSIS — S46811A Strain of other muscles, fascia and tendons at shoulder and upper arm level, right arm, initial encounter: Secondary | ICD-10-CM | POA: Diagnosis not present

## 2021-01-28 MED ORDER — NAPROXEN 500 MG PO TABS: 500.0000 mg | ORAL_TABLET | Freq: Two times a day (BID) | ORAL | 0 refills | Status: DC

## 2021-01-28 MED ORDER — TIZANIDINE HCL 4 MG PO TABS: 4.0000 mg | ORAL_TABLET | Freq: Three times a day (TID) | ORAL | 0 refills | Status: DC | PRN

## 2021-01-28 NOTE — Discharge Instructions (Addendum)
Discontinue Mobic, will start Naprosyn 500 mg twice daily.  Ice or heat, whichever feels better.  Use a rolling suitcase, rather than carry your books in a backpack.

## 2021-01-28 NOTE — ED Triage Notes (Addendum)
RT neck and shoulder pain with movement since Tuesday.  Denies any injury.  Taking mobic with no relief.

## 2021-01-28 NOTE — ED Provider Notes (Signed)
HPI  SUBJECTIVE:  Shannon Roth is a 55 y.o. female who presents with 1 week of constant, daily, sharp anterior neck and right upper anterior chest pain that is worse with shrugging her shoulders, turning her head to the left, lying on the shoulder, flexion/extension of her neck.  She wears a heavy backpack to school on both of her shoulders on a daily basis.  No known trauma to the neck.  No fevers, chest pain, exertional component, shortness of breath, distal numbness or tingling, grip weakness, visual changes, arm or leg weakness, slurred speech.  She has tried Mobic 7.5 mg twice daily without improvement in her symptoms.  She has a past medical history of fatty liver disease, chronic migraines, hypertension and GERD.  No history of diabetes, hypercholesterolemia, MI, coronary disease, degenerative disc disease, osteoarthritis of the C-spine, carotid artery disease.  [id number], Norwood Levo, MD  Past Medical History:  Diagnosis Date   Arthritis    Cough    Migraine    Migraine 2002   Obesity    Rectal bleeding     Past Surgical History:  Procedure Laterality Date   bilateral BIOPSY BREAST  2002   benign   Bilateral tubal ligation  2001   BIOPSY  05/17/2018   Procedure: BIOPSY;  Surgeon: Danie Binder, MD;  [facility];  Service: Endoscopy;;  duodenum and gastric   CHOLECYSTECTOMY  2003   COLONOSCOPY N/A 06/21/2015   SLF: hyperplastic polyps removed. next TCS 10 years   ESOPHAGOGASTRODUODENOSCOPY (EGD) WITH PROPOFOL N/A 05/17/2018   Procedure: ESOPHAGOGASTRODUODENOSCOPY (EGD) WITH PROPOFOL;  Surgeon: Danie Binder, MD;  [facility];  Service: Endoscopy;  Laterality: N/A;  1:45pm   EYE SURGERY     right cataract removal    GIVENS CAPSULE STUDY N/A 05/17/2018   Procedure: GIVENS CAPSULE STUDY;  Surgeon: Danie Binder, MD;  [facility];  Service: Endoscopy;  Laterality: N/A;   TOTAL ABDOMINAL HYSTERECTOMY W/ BILATERAL SALPINGOOPHORECTOMY  2003    TUBAL LIGATION      Family History  Problem Relation Age of Onset   Asthma Mother    Diabetes Mother    Arthritis Mother    Heart disease Mother    Hypertension Father    Emphysema Paternal Grandfather    Emphysema Maternal Grandmother    Lung cancer Maternal Grandmother    Breast cancer Sister    Stroke Sister    Diabetes Brother    Heart disease Brother    Colon cancer Paternal Uncle        24s   Liver disease Neg Hx     Social History   Tobacco Use   Smoking status: Never    Passive exposure: Yes   Smokeless tobacco: Never   Tobacco comments:    Father & close friend  Vaping Use   Vaping Use: Never used  Substance Use Topics   Alcohol use: No    Alcohol/week: 0.0 standard drinks   Drug use: No    No current facility-administered medications for this encounter.  Current Outpatient Medications:    naproxen (NAPROSYN) 500 MG tablet, Take 1 tablet (500 mg total) by mouth 2 (two) times daily., Disp: 20 tablet, Rfl: 0   tiZANidine (ZANAFLEX) 4 MG tablet, Take 1 tablet (4 mg total) by mouth every 8 (eight) hours as needed for muscle spasms., Disp: 30 tablet, Rfl: 0   Biotin 10 MG CAPS, Take 10 mg by mouth daily., Disp: , Rfl:  BLACK CURRANT SEED OIL PO, Take by mouth daily., Disp: , Rfl:    COLLAGEN PO, Take by mouth daily., Disp: , Rfl:    ergocalciferol (VITAMIN D2) 1.25 MG (50000 UT) capsule, Take 1 capsule (50,000 Units total) by mouth once a week. One capsule once weekly, Disp: 12 capsule, Rfl: 2   Flaxseed, Linseed, (FLAX SEEDS) POWD, Take by mouth daily., Disp: , Rfl:    fluticasone (FLONASE) 50 MCG/ACT nasal spray, Place 1 spray into both nostrils daily for 14 days., Disp: 16 g, Rfl: 0   omeprazole (PRILOSEC) 20 MG capsule, Take 1 capsule (20 mg total) by mouth daily., Disp: 30 capsule, Rfl: 3   OVER THE COUNTER MEDICATION, CBD oil if needed, Disp: , Rfl:    spironolactone (ALDACTONE) 100 MG tablet, Take 1 tablet (100 mg total) by mouth daily., Disp: 90  tablet, Rfl: 1  Allergies  Allergen Reactions   Gabapentin    Tramadol    Codeine Nausea And Vomiting   Penicillins Rash     ROS  As noted in HPI.   Physical Exam  BP (!) 142/85 (BP Location: Right Wrist)   Pulse 61   Temp 98.8 F (37.1 C) (Oral)   Resp 19   SpO2 99%   Constitutional: Well developed, well nourished, no acute distress Eyes:  EOMI, conjunctiva normal bilaterally HENT: Normocephalic, atraumatic,mucus membranes moist Neck: Right trapezius tenderness.  Tenderness, spasm over the right sternocleidomastoid.  Tenderness anterior upper right chest.  Pain with head rotation to the left and with neck extension. Respiratory: Normal inspiratory effort Cardiovascular: Normal rate, regular rhythm, no murmurs rubs or gallops.  No carotid bruit. GI: nondistended skin: No rash, skin intact Musculoskeletal: no deformities Neurologic: Alert & oriented x 3, no focal neuro deficits cranial nerves III through XII intact.  Sensation and motor bilateral upper extremities equal. Psychiatric: Speech and behavior appropriate   ED Course   Medications - No data to display  No orders of the defined types were placed in this encounter.   No results found for this or any previous visit (from the past 24 hour(s)). No results found.  ED Clinical Impression  1. Strain of sternocleidomastoid muscle, initial encounter   2. Strain of right trapezius muscle, initial encounter      ED Assessment/Plan  Patient with trapezius and sternocleidomastoid strain.  She has spasm over the SCM.  Doubt ACS.  No evidence of stroke.  Will send home with Zanaflex.  Discontinue Mobic, will start Naprosyn 500 mg twice daily.  Ice or heat, whichever feels better.  Use a rolling suitcase, rather than carry her books in a backpack.  Follow-up with her PMD as needed.  ER return precautions given.  Discussed MDM, treatment plan, and plan for follow-up with patient. Discussed sn/sx that should prompt  return to the ED. patient agrees with plan.   Meds ordered this encounter  Medications   tiZANidine (ZANAFLEX) 4 MG tablet    Sig: Take 1 tablet (4 mg total) by mouth every 8 (eight) hours as needed for muscle spasms.    Dispense:  30 tablet    Refill:  0   naproxen (NAPROSYN) 500 MG tablet    Sig: Take 1 tablet (500 mg total) by mouth 2 (two) times daily.    Dispense:  20 tablet    Refill:  0      *This clinic note was created using [employer]. Therefore, there may be occasional mistakes despite careful proofreading.  ?  Melynda Ripple, MD 01/30/21 [phone]

## 2021-02-06 ENCOUNTER — Ambulatory Visit (INDEPENDENT_AMBULATORY_CARE_PROVIDER_SITE_OTHER): Payer: Commercial Managed Care - PPO | Admitting: Family Medicine

## 2021-02-06 ENCOUNTER — Other Ambulatory Visit: Payer: Self-pay

## 2021-02-06 ENCOUNTER — Encounter: Payer: Self-pay | Admitting: Family Medicine

## 2021-02-06 VITALS — BP 130/82 | HR 60 | Temp 98.3°F | Resp 20 | Ht 68.0 in | Wt 333.0 lb

## 2021-02-06 DIAGNOSIS — T8189XA Other complications of procedures, not elsewhere classified, initial encounter: Secondary | ICD-10-CM

## 2021-02-06 DIAGNOSIS — I10 Essential (primary) hypertension: Secondary | ICD-10-CM

## 2021-02-06 MED ORDER — SULFAMETHOXAZOLE-TRIMETHOPRIM 800-160 MG PO TABS: 1.0000 | ORAL_TABLET | Freq: Two times a day (BID) | ORAL | 0 refills | Status: DC

## 2021-02-06 NOTE — Patient Instructions (Addendum)
Please reschedule September appt to early to mid November, call if you need me sooner.Flu vaccine at that visit.  Blood pressure is excellent continue spironolactone 100 mg daily.  Septra an antibiotic is prescribed for 10 days for the infection of the skin of  your right breast.  It is important that you contact dermatology if the wound continues to remain open and draining.  Please get your covid booster in the next 2 to 6 weeks.  It is important that you exercise regularly at least 30 minutes 5 times a week. If you develop chest pain, have severe difficulty breathing, or feel very tired, stop exercising immediately and seek medical attention  Thanks for choosing [facility], we consider it a privelige to serve you.

## 2021-02-10 DIAGNOSIS — T8189XA Other complications of procedures, not elsewhere classified, initial encounter: Secondary | ICD-10-CM | POA: Insufficient documentation

## 2021-02-10 NOTE — Assessment & Plan Note (Signed)
Controlled, no change in medication DASH diet and commitment to daily physical activity for a minimum of 30 minutes discussed and encouraged, as a part of hypertension management. The importance of attaining a healthy weight is also discussed.  BP/Weight 02/06/2021 01/28/2021 01/08/2021 10/25/2020 07/11/2020 06/26/2020 [id number]  Systolic BP [id number] [id number]  Diastolic BP 82 85 84 87 91 85 92  Wt. (Lbs) 333 - 332 334 327 315 330  BMI 50.63 - 50.48 50.78 49.72 47.9 50.18

## 2021-02-10 NOTE — Progress Notes (Signed)
   Shannon KOENEMAN     MRN: [id number]      DOB: [birth year]   HPI Ms. Shannon Roth is here c/o drainage andnon haling of incision of right breast where skin lesion was removed over 1 month ago  ROS Denies recent fever or chills. Denies sinus pressure, nasal congestion, ear pain or sore throat. Denies chest congestion, productive cough or wheezing. . Denies depression, anxiety or insomnia. PE  BP 130/82   Pulse 60   Temp 98.3 F (36.8 C)   Resp 20   Ht '5\' 8"'$  (1.727 m)   Wt (!) 333 lb (151 kg)   SpO2 95%   BMI 50.63 kg/m   Patient alert and oriented and in no cardiopulmonary distress.  HEENT: No facial asymmetry, EOMI,     Neck supple .  Chest: Clear to auscultation bilaterally. Breast: right superficial ulceration of skin where lesion recently removed, not healed with scant drainage, tissue looks healthy CVS: S1, S2 no murmurs, no S3.Regular rate.  ABD: Soft non tender.   Ext: No edema  MS: Adequate ROM spine, shoulders, hips and knees.    Psych: Good eye contact, normal affect. Memory intact not anxious or depressed appearing.    Assessment & Plan  Non-healing surgical wound Reports non healing of skin of right breast following removal of lesion over 1 month ago,an drainage from the op site, ahs not been in contact with dermatology, denies fever Septra prescribed and recommended that she f/u with dermatology also  Essential hypertension Controlled, no change in medication DASH diet and commitment to daily physical activity for a minimum of 30 minutes discussed and encouraged, as a part of hypertension management. The importance of attaining a healthy weight is also discussed.  BP/Weight 02/06/2021 01/28/2021 01/08/2021 10/25/2020 07/11/2020 06/26/2020 [id number]  Systolic BP [id number] [id number] 0000000 [id number] [id number]  Diastolic BP 82 85 84 87 91 85 92  Wt. (Lbs) 333 - 332 334 327 315 330  BMI 50.63 - 50.48 50.78 49.72 47.9 50.18

## 2021-02-10 NOTE — Assessment & Plan Note (Signed)
Reports non healing of skin of right breast following removal of lesion over 1 month ago,an drainage from the op site, ahs not been in contact with dermatology, denies fever Septra prescribed and recommended that she f/u with dermatology also

## 2021-03-07 ENCOUNTER — Ambulatory Visit: Payer: Commercial Managed Care - PPO | Admitting: Family Medicine

## 2021-04-23 ENCOUNTER — Other Ambulatory Visit: Payer: Self-pay | Admitting: Family Medicine

## 2021-04-24 ENCOUNTER — Ambulatory Visit: Payer: Commercial Managed Care - PPO | Admitting: Family Medicine

## 2021-04-30 ENCOUNTER — Ambulatory Visit: Payer: Commercial Managed Care - PPO | Admitting: Family Medicine

## 2021-05-02 ENCOUNTER — Ambulatory Visit (INDEPENDENT_AMBULATORY_CARE_PROVIDER_SITE_OTHER): Payer: Commercial Managed Care - PPO

## 2021-05-02 ENCOUNTER — Encounter: Payer: Self-pay | Admitting: Emergency Medicine

## 2021-05-02 ENCOUNTER — Other Ambulatory Visit: Payer: Self-pay

## 2021-05-02 ENCOUNTER — Ambulatory Visit
Admission: EM | Admit: 2021-05-02 | Discharge: 2021-05-02 | Disposition: A | Discharge status: Home / Self Care | Payer: Commercial Managed Care - PPO | Attending: Urgent Care | Admitting: Urgent Care

## 2021-05-02 DIAGNOSIS — S46911A Strain of unspecified muscle, fascia and tendon at shoulder and upper arm level, right arm, initial encounter: Secondary | ICD-10-CM

## 2021-05-02 DIAGNOSIS — M25511 Pain in right shoulder: Secondary | ICD-10-CM

## 2021-05-02 DIAGNOSIS — M19011 Primary osteoarthritis, right shoulder: Secondary | ICD-10-CM

## 2021-05-02 MED ORDER — TIZANIDINE HCL 4 MG PO TABS: 4.0000 mg | ORAL_TABLET | Freq: Every day | ORAL | 0 refills | Status: DC

## 2021-05-02 MED ORDER — NAPROXEN 500 MG PO TABS: 500.0000 mg | ORAL_TABLET | Freq: Two times a day (BID) | ORAL | 0 refills | Status: DC

## 2021-05-02 MED ORDER — KETOROLAC TROMETHAMINE 60 MG/2ML IM SOLN
60.0000 mg | Freq: Once | INTRAMUSCULAR | Status: AC
Start: 2021-05-02 — End: 2021-05-02
  Administered 2021-05-02: 60 mg via INTRAMUSCULAR

## 2021-05-02 NOTE — ED Triage Notes (Signed)
PT reports she picked up her grandson in the middle of the night and didn't notice immediate pain. When she woke up she had severe right shoulder pain. Pain is worse with movement of arm, laying down, and palpating area.

## 2021-05-02 NOTE — ED Provider Notes (Signed)
[place]   MRN: [id number] DOB: [birth year]  Subjective:   Shannon Roth is a 55 y.o. female presenting for hurting her right shoulder 2 nights ago.  Patient was trying to lift her grandson and did not correctly.  Ended up pulling her shoulder.  Has since had significant difficulty over pain of the upper back, shoulder.  No fall, trauma  No current facility-administered medications for this encounter.  Current Outpatient Medications:    Biotin 10 MG CAPS, Take 10 mg by mouth daily., Disp: , Rfl:    BLACK CURRANT SEED OIL PO, Take by mouth daily., Disp: , Rfl:    COLLAGEN PO, Take by mouth daily., Disp: , Rfl:    ergocalciferol (VITAMIN D2) 1.25 MG (50000 UT) capsule, Take 1 capsule (50,000 Units total) by mouth once a week. One capsule once weekly, Disp: 12 capsule, Rfl: 2   Flaxseed, Linseed, (FLAX SEEDS) POWD, Take by mouth daily., Disp: , Rfl:    fluticasone (FLONASE) 50 MCG/ACT nasal spray, Place 1 spray into both nostrils daily for 14 days., Disp: 16 g, Rfl: 0   naproxen (NAPROSYN) 500 MG tablet, Take 1 tablet (500 mg total) by mouth 2 (two) times daily., Disp: 20 tablet, Rfl: 0   omeprazole (PRILOSEC) 20 MG capsule, TAKE 1 CAPSULE(20 MG) BY MOUTH DAILY, Disp: 30 capsule, Rfl: 3   OVER THE COUNTER MEDICATION, CBD oil if needed, Disp: , Rfl:    spironolactone (ALDACTONE) 100 MG tablet, Take 1 tablet (100 mg total) by mouth daily., Disp: 90 tablet, Rfl: 1   sulfamethoxazole-trimethoprim (BACTRIM DS) 800-160 MG tablet, Take 1 tablet by mouth 2 (two) times daily., Disp: 20 tablet, Rfl: 0   tiZANidine (ZANAFLEX) 4 MG tablet, Take 1 tablet (4 mg total) by mouth every 8 (eight) hours as needed for muscle spasms., Disp: 30 tablet, Rfl: 0   Allergies  Allergen Reactions   Gabapentin    Tramadol    Codeine Nausea And Vomiting   Penicillins Rash    Past Medical History:  Diagnosis Date   Arthritis    Cough    Migraine    Migraine 2002   Obesity    Rectal  bleeding      Past Surgical History:  Procedure Laterality Date   bilateral BIOPSY BREAST  2002   benign   Bilateral tubal ligation  2001   BIOPSY  05/17/2018   Procedure: BIOPSY;  Surgeon: Danie Binder, MD;  [facility];  Service: Endoscopy;;  duodenum and gastric   CHOLECYSTECTOMY  2003   COLONOSCOPY N/A 06/21/2015   SLF: hyperplastic polyps removed. next TCS 10 years   ESOPHAGOGASTRODUODENOSCOPY (EGD) WITH PROPOFOL N/A 05/17/2018   Procedure: ESOPHAGOGASTRODUODENOSCOPY (EGD) WITH PROPOFOL;  Surgeon: Danie Binder, MD;  [facility];  Service: Endoscopy;  Laterality: N/A;  1:45pm   EYE SURGERY     right cataract removal    GIVENS CAPSULE STUDY N/A 05/17/2018   Procedure: GIVENS CAPSULE STUDY;  Surgeon: Danie Binder, MD;  [facility];  Service: Endoscopy;  Laterality: N/A;   TOTAL ABDOMINAL HYSTERECTOMY W/ BILATERAL SALPINGOOPHORECTOMY  2003   TUBAL LIGATION      Family History  Problem Relation Age of Onset   Asthma Mother    Diabetes Mother    Arthritis Mother    Heart disease Mother    Hypertension Father    Emphysema Paternal Grandfather    Emphysema Maternal Grandmother    Lung cancer Maternal Grandmother  Breast cancer Sister    Stroke Sister    Diabetes Brother    Heart disease Brother    Colon cancer Paternal Uncle        37s   Liver disease Neg Hx     Social History   Tobacco Use   Smoking status: Never    Passive exposure: Yes   Smokeless tobacco: Never   Tobacco comments:    Father & close friend  Vaping Use   Vaping Use: Never used  Substance Use Topics   Alcohol use: No    Alcohol/week: 0.0 standard drinks   Drug use: No    ROS   Objective:   Vitals: BP (!) 149/88   Pulse (!) 56   Temp 98.3 F (36.8 C) (Oral)   Resp 16   SpO2 99%   Physical Exam Constitutional:      General: She is not in acute distress.    Appearance: Normal appearance. She is well-developed. She is not ill-appearing,  toxic-appearing or diaphoretic.  HENT:     Head: Normocephalic and atraumatic.     Nose: Nose normal.     Mouth/Throat:     Mouth: Mucous membranes are moist.     Pharynx: Oropharynx is clear.  Eyes:     General: No scleral icterus.       Right eye: No discharge.        Left eye: No discharge.     Extraocular Movements: Extraocular movements intact.     Conjunctiva/sclera: Conjunctivae normal.     Pupils: Pupils are equal, round, and reactive to light.  Cardiovascular:     Rate and Rhythm: Normal rate.  Pulmonary:     Effort: Pulmonary effort is normal.  Musculoskeletal:     Right shoulder: Tenderness (over deltoids) present. No swelling, deformity, effusion, laceration, bony tenderness or crepitus. Decreased range of motion. Normal strength.  Skin:    General: Skin is warm and dry.  Neurological:     General: No focal deficit present.     Mental Status: She is alert and oriented to person, place, and time.  Psychiatric:        Mood and Affect: Mood normal.        Behavior: Behavior normal.        Thought Content: Thought content normal.        Judgment: Judgment normal.    DG Shoulder Right  Result Date: 05/02/2021 CLINICAL DATA:  Right shoulder pain.  Lifting injury. EXAM: RIGHT SHOULDER - 2+ VIEW COMPARISON:  None. FINDINGS: The mineralization and alignment are normal. There is no evidence of acute fracture or dislocation. There are mild acromioclavicular and glenohumeral degenerative changes. The subacromial space appears adequately preserved. IMPRESSION: No evidence of acute fracture or dislocation. Mild degenerative changes. Electronically Signed   By: Richardean Sale M.D.   On: 05/02/2021 19:37    IM Toradol in clinic.  Assessment and Plan :   PDMP not reviewed this encounter.  1. Acute pain of right shoulder   2. Shoulder strain, right, initial encounter   3. Arthritis of right shoulder region     Will manage conservatively for shoulder strain with NSAID and  muscle relaxant, rest and modification of physical activity.  Anticipatory guidance provided.  Counseled patient on potential for adverse effects with medications prescribed/recommended today, ER and return-to-clinic precautions discussed, patient verbalized understanding.    Jaynee Eagles, PA-C 05/03/21 1001

## 2021-05-21 ENCOUNTER — Ambulatory Visit (INDEPENDENT_AMBULATORY_CARE_PROVIDER_SITE_OTHER): Payer: Commercial Managed Care - PPO | Admitting: Family Medicine

## 2021-05-21 ENCOUNTER — Encounter: Payer: Self-pay | Admitting: Family Medicine

## 2021-05-21 ENCOUNTER — Other Ambulatory Visit: Payer: Self-pay

## 2021-05-21 VITALS — BP 137/90 | HR 59 | Resp 16 | Ht 68.0 in | Wt 333.1 lb

## 2021-05-21 DIAGNOSIS — M25561 Pain in right knee: Secondary | ICD-10-CM | POA: Diagnosis not present

## 2021-05-21 DIAGNOSIS — M79672 Pain in left foot: Secondary | ICD-10-CM | POA: Diagnosis not present

## 2021-05-21 DIAGNOSIS — Z23 Encounter for immunization: Secondary | ICD-10-CM | POA: Diagnosis not present

## 2021-05-21 DIAGNOSIS — G8929 Other chronic pain: Secondary | ICD-10-CM

## 2021-05-21 MED ORDER — SAXENDA 18 MG/3ML ~~LOC~~ SOPN: 0.6000 mg | PEN_INJECTOR | Freq: Every day | SUBCUTANEOUS | 0 refills | Status: DC

## 2021-05-21 MED ORDER — SAXENDA 18 MG/3ML ~~LOC~~ SOPN: 1.2000 mg | PEN_INJECTOR | Freq: Every day | SUBCUTANEOUS | 2 refills | Status: DC

## 2021-05-21 MED ORDER — NAPROXEN 500 MG PO TABS: ORAL_TABLET | ORAL | 3 refills | Status: DC

## 2021-05-21 NOTE — Progress Notes (Signed)
Rybelsus 3   Shannon Roth     MRN: [id number]      DOB: Aug 27, 1965   HPI [name] is here for follow up and re-evaluation of chronic medical conditions, medication management and review of any available recent lab and radiology data.  Preventive health is updated, specifically  Cancer screening and Immunization.   Left foot pain rated at 9,  takes naproxen 500 mg at night  and gives some rellief though at times pain awakens her Has seen Podiatry in th past for left foot pain, approx 2 years ago ROS Denies recent fever or chills. Denies sinus pressure, nasal congestion, ear pain or sore throat. Denies chest congestion, productive cough or wheezing. Denies chest pains, palpitations and leg swelling Denies abdominal pain, nausea, vomiting,diarrhea or constipation.   Denies dysuria, frequency, hesitancy or incontinence. . Denies headaches, seizures, numbness, or tingling. Denies depression, anxiety or insomnia. Denies skin break down or rash.   PE  BP 137/90    Pulse (!) 59    Resp 16    Ht 5\' 8"  (1.727 m)    Wt (!) 333 lb 1.9 oz (151.1 kg)    SpO2 97%    BMI 50.65 kg/m   Patient alert and oriented and in no cardiopulmonary distress.Pt in pain  HEENT: No facial asymmetry, EOMI,     Neck supple .  Chest: Clear to auscultation bilaterally.  CVS: S1, S2 no murmurs, no S3.Regular rate.  ABD: Soft non tender.   Ext: No edema  MS: decreased ROM lumbar spine Reduced in right  knee. Tender to palpation on lateral aspect of left foot, with reduced ankle mobility Skin: Intact, no ulcerations or rash noted.  Psych: Good eye contact, normal affect. Memory intact not anxious or depressed appearing.  CNS: CN 2-12 intact, power,  normal throughout.no focal deficits noted.   Assessment & Plan  Chronic pain in left foot Daily naproxen and ES tylenol, and podiatry referral  Knee pain, right Short course of ant inflammatory prescribed  Morbid obesity  Patient re-educated about   the importance of commitment to a  minimum of 150 minutes of exercise per week as able.  The importance of healthy food choices with portion control discussed, as well as eating regularly and within a 12 hour window most days. The need to choose "clean , green" food 50 to 75% of the time is discussed, as well as to make water the primary drink and set a goal of 64 ounces water daily.    Weight /BMI 05/21/2021 02/06/2021 01/08/2021  WEIGHT 333 lb 1.9 oz 333 lb 332 lb  HEIGHT 5\' 8"  5\' 8"  5\' 8"   BMI 50.65 kg/m2 50.63 kg/m2 50.48 kg/m2

## 2021-05-21 NOTE — Assessment & Plan Note (Signed)
Daily naproxen and ES tylenol, and podiatry referral

## 2021-05-21 NOTE — Patient Instructions (Addendum)
F/u in 10 weeks, call if you need me sooner  PLEASE schedule mammogram at checkout  Flu vaccine today  Please get covid booster asap also  You are referred to Podiatry  Start naproxen 1 at night and eS tylenol 1 daily for left foot and right knee pain  Work on changing / [employer] intake, limit to 1200 to 1500 cal/ day  New is once daily saxenda to help with weight loss, goal weight loss is 1 to 2 pounds per week  Thanks for choosing [facility], we consider it a privelige to serve you.

## 2021-05-22 ENCOUNTER — Other Ambulatory Visit: Payer: Self-pay | Admitting: Family Medicine

## 2021-05-23 ENCOUNTER — Ambulatory Visit (INDEPENDENT_AMBULATORY_CARE_PROVIDER_SITE_OTHER): Payer: Commercial Managed Care - PPO

## 2021-05-23 ENCOUNTER — Ambulatory Visit (INDEPENDENT_AMBULATORY_CARE_PROVIDER_SITE_OTHER): Payer: Commercial Managed Care - PPO | Admitting: Podiatry

## 2021-05-23 ENCOUNTER — Other Ambulatory Visit: Payer: Self-pay

## 2021-05-23 DIAGNOSIS — M778 Other enthesopathies, not elsewhere classified: Secondary | ICD-10-CM

## 2021-05-23 DIAGNOSIS — M79672 Pain in left foot: Secondary | ICD-10-CM

## 2021-05-23 MED ORDER — METHYLPREDNISOLONE 4 MG PO TBPK: ORAL_TABLET | ORAL | 0 refills | Status: DC

## 2021-05-26 NOTE — Progress Notes (Signed)
Subjective:   Patient ID: Shannon Roth, female   DOB: 55 y.o.   MRN: [id number]   HPI 55 year old female presents the office with concerns of left lateral foot pain which is been on about 6 months without any injury.  She denies any significant swelling but her pain level is 9/10 at times and sharp.  Hurts to walk at times.  She is taken naproxen which helps relieve pain.  She states that she had similar symptoms about 4 to 5 years ago she saw Dr. Yancey Flemings for this previously.   Review of Systems  All other systems reviewed and are negative.  Past Medical History:  Diagnosis Date   Arthritis    Cough    Migraine    Migraine 2002   Obesity    Rectal bleeding     Past Surgical History:  Procedure Laterality Date   bilateral BIOPSY BREAST  2002   benign   Bilateral tubal ligation  2001   BIOPSY  05/17/2018   Procedure: BIOPSY;  Surgeon: Danie Binder, MD;  [facility];  Service: Endoscopy;;  duodenum and gastric   CHOLECYSTECTOMY  2003   COLONOSCOPY N/A 06/21/2015   SLF: hyperplastic polyps removed. next TCS 10 years   ESOPHAGOGASTRODUODENOSCOPY (EGD) WITH PROPOFOL N/A 05/17/2018   Procedure: ESOPHAGOGASTRODUODENOSCOPY (EGD) WITH PROPOFOL;  Surgeon: Danie Binder, MD;  [facility];  Service: Endoscopy;  Laterality: N/A;  1:45pm   EYE SURGERY     right cataract removal    GIVENS CAPSULE STUDY N/A 05/17/2018   Procedure: GIVENS CAPSULE STUDY;  Surgeon: Danie Binder, MD;  [facility];  Service: Endoscopy;  Laterality: N/A;   TOTAL ABDOMINAL HYSTERECTOMY W/ BILATERAL SALPINGOOPHORECTOMY  2003   TUBAL LIGATION       Current Outpatient Medications:    methylPREDNISolone (MEDROL DOSEPAK) 4 MG TBPK tablet, Take as directed, Disp: 21 tablet, Rfl: 0   Biotin 10 MG CAPS, Take 10 mg by mouth daily., Disp: , Rfl:    COLLAGEN PO, Take by mouth daily., Disp: , Rfl:    ergocalciferol (VITAMIN D2) 1.25 MG (50000 UT) capsule, Take 1 capsule (50,000  Units total) by mouth once a week. One capsule once weekly, Disp: 12 capsule, Rfl: 2   Flaxseed, Linseed, (FLAX SEEDS) POWD, Take by mouth daily., Disp: , Rfl:    fluticasone (FLONASE) 50 MCG/ACT nasal spray, Place 1 spray into both nostrils daily for 14 days., Disp: 16 g, Rfl: 0   Liraglutide -Weight Management (SAXENDA) 18 MG/3ML SOPN, Inject 0.6 mg into the skin daily., Disp: 3 mL, Rfl: 0   [START ON 06/18/2022] Liraglutide -Weight Management (SAXENDA) 18 MG/3ML SOPN, Inject 1.2 mg into the skin daily., Disp: 12 mL, Rfl: 2   naproxen (NAPROSYN) 500 MG tablet, Take one tablet by mouth once daily, as needed, for foot and/or knee pain, Disp: 30 tablet, Rfl: 3   omeprazole (PRILOSEC) 20 MG capsule, TAKE 1 CAPSULE(20 MG) BY MOUTH DAILY, Disp: 30 capsule, Rfl: 3   spironolactone (ALDACTONE) 100 MG tablet, TAKE 1 TABLET(100 MG) BY MOUTH DAILY, Disp: 90 tablet, Rfl: 1   tiZANidine (ZANAFLEX) 4 MG tablet, Take 1 tablet (4 mg total) by mouth at bedtime., Disp: 30 tablet, Rfl: 0  Allergies  Allergen Reactions   Gabapentin    Tramadol    Codeine Nausea And Vomiting   Penicillins Rash          Objective:  Physical Exam  General: AAO x3, NAD  Dermatological: Skin is warm, dry and supple bilateral. . There are no open sores, no preulcerative lesions, no rash or signs of infection present.  Vascular: Dorsalis Pedis artery and Posterior Tibial artery pedal pulses are 2/4 bilateral with immedate capillary fill time.  There is no pain with calf compression, swelling, warmth, erythema.   Neruologic: Grossly intact via light touch bilateral.   Musculoskeletal: Tenderness palpation present on left foot along the fourth and fifth MPJs.  There is tenderness along the metatarsal as well in the fourth.  There is slight edema but there is no erythema or warmth.  Muscular strength 5/5 in all groups tested bilateral.  Gait: Unassisted, Nonantalgic.       Assessment:   Capsulitis right foot     Plan:   -Treatment options discussed including all alternatives, risks, and complications -Etiology of symptoms were discussed -X-rays were obtained and reviewed with the patient.  Likely old fracture present of the fourth metatarsal neck.  No evidence of acute fracture. -Prescribed Medrol Dosepak. -Surgical shoe dispensed -Ice and elevation  No follow-ups on file.  Trula Slade DPM

## 2021-05-28 ENCOUNTER — Other Ambulatory Visit: Payer: Self-pay | Admitting: Podiatry

## 2021-05-28 DIAGNOSIS — M778 Other enthesopathies, not elsewhere classified: Secondary | ICD-10-CM

## 2021-05-29 ENCOUNTER — Telehealth: Payer: Self-pay

## 2021-05-29 ENCOUNTER — Telehealth: Payer: Self-pay | Admitting: Family Medicine

## 2021-05-29 NOTE — Telephone Encounter (Signed)
The PA was approved but her deductible was still $300. Formulary alternatives are Rybelsus, ozempic and victoza

## 2021-05-29 NOTE — Telephone Encounter (Signed)
Patient called and said the 30 day supply was $ 327.00  a month.  Still to expensive. Asked if nurse will give her a call back. [phone]

## 2021-05-29 NOTE — Telephone Encounter (Signed)
Auth was approved but she said it still didn't change the price  Wants something else sent in

## 2021-05-29 NOTE — Telephone Encounter (Signed)
Auth approved- she may be able to get the shots now. Needs to check the price now at the pharmacy

## 2021-05-29 NOTE — Telephone Encounter (Signed)
error 

## 2021-05-29 NOTE — Telephone Encounter (Signed)
Pt called in regard to weight loss shots   Pt states that the shots are betweeen $250 - $300 , pt is unable to pay for thoese.   Pt spoke with insurance and it is cheaper to get the shots in pill form   Pt would like to have the medication sent in in pill form instead of shot form   Prescription also needs authorization

## 2021-05-30 ENCOUNTER — Encounter: Payer: Self-pay | Admitting: Family Medicine

## 2021-05-30 MED ORDER — RYBELSUS 3 MG PO TABS: 3.0000 mg | ORAL_TABLET | Freq: Every day | ORAL | 2 refills | Status: DC

## 2021-05-30 NOTE — Assessment & Plan Note (Signed)
°  Patient re-educated about  the importance of commitment to a  minimum of 150 minutes of exercise per week as able.  The importance of healthy food choices with portion control discussed, as well as eating regularly and within a 12 hour window most days. The need to choose "clean , green" food 50 to 75% of the time is discussed, as well as to make water the primary drink and set a goal of 64 ounces water daily.    Weight /BMI 05/21/2021 02/06/2021 01/08/2021  WEIGHT 333 lb 1.9 oz 333 lb 332 lb  HEIGHT 5\' 8"  5\' 8"  5\' 8"   BMI 50.65 kg/m2 50.63 kg/m2 50.48 kg/m2

## 2021-05-30 NOTE — Telephone Encounter (Signed)
Spoke with pt she will check with pharmacy to make sure the rybelsus is covered, and if it is not she will call us back

## 2021-05-30 NOTE — Assessment & Plan Note (Signed)
Short course of ant inflammatory prescribed

## 2021-06-13 ENCOUNTER — Other Ambulatory Visit: Payer: Self-pay

## 2021-06-13 ENCOUNTER — Ambulatory Visit (INDEPENDENT_AMBULATORY_CARE_PROVIDER_SITE_OTHER): Payer: Commercial Managed Care - PPO | Admitting: Podiatry

## 2021-06-13 DIAGNOSIS — M778 Other enthesopathies, not elsewhere classified: Secondary | ICD-10-CM

## 2021-06-17 NOTE — Progress Notes (Signed)
Subjective: 56 year old female presents the office today for evaluation of lateral foot pain which is been on for about 6 months.  Since I last saw her she is doing much better and she is able to walk without the cam boot.  Some occasional discomfort still present this morning but overall she is been doing much better no significant pain.  No recent injury or changes otherwise since I last saw her.  Objective: AAO x3, NAD DP/PT pulses palpable bilaterally, CRT less than 3 seconds On today's exam unable to elicit any area of pinpoint tenderness.  Particular there is no discomfort along the fourth and fifth MPJs today there is no area of pinpoint tenderness.  No significant edema, erythema.  Flexor, extensor tendons appear to be intact.  MMT 5/5.  No pain with calf compression, swelling, warmth, erythema  Assessment: Resolving left foot pain, capsulitis  Plan: -All treatment options discussed with the patient including all alternatives, risks, complications.  -Overall doing much better pain seems to be significantly improved.  At this point continue with shoes and good arch supports.  Anti-inflammatories as needed. -Patient encouraged to call the office with any questions, concerns, change in symptoms.   Return if symptoms worsen or fail to improve.  Trula Slade DPM

## 2021-07-30 ENCOUNTER — Ambulatory Visit: Payer: Commercial Managed Care - PPO | Admitting: Family Medicine

## 2021-07-31 ENCOUNTER — Other Ambulatory Visit: Payer: Self-pay

## 2021-07-31 ENCOUNTER — Ambulatory Visit (INDEPENDENT_AMBULATORY_CARE_PROVIDER_SITE_OTHER): Payer: Commercial Managed Care - PPO | Admitting: Family Medicine

## 2021-07-31 ENCOUNTER — Encounter: Payer: Self-pay | Admitting: Family Medicine

## 2021-07-31 VITALS — BP 140/72 | HR 62 | Resp 16 | Ht 68.0 in | Wt 328.0 lb

## 2021-07-31 DIAGNOSIS — E559 Vitamin D deficiency, unspecified: Secondary | ICD-10-CM

## 2021-07-31 DIAGNOSIS — R7302 Impaired glucose tolerance (oral): Secondary | ICD-10-CM

## 2021-07-31 DIAGNOSIS — I10 Essential (primary) hypertension: Secondary | ICD-10-CM | POA: Diagnosis not present

## 2021-07-31 DIAGNOSIS — K219 Gastro-esophageal reflux disease without esophagitis: Secondary | ICD-10-CM

## 2021-07-31 DIAGNOSIS — Z13220 Encounter for screening for lipoid disorders: Secondary | ICD-10-CM

## 2021-07-31 MED ORDER — AMLODIPINE BESYLATE 2.5 MG PO TABS: 2.5000 mg | ORAL_TABLET | Freq: Every day | ORAL | 3 refills | Status: DC

## 2021-07-31 MED ORDER — PHENTERMINE HCL 37.5 MG PO TABS: ORAL_TABLET | ORAL | 1 refills | Status: DC

## 2021-07-31 NOTE — Patient Instructions (Addendum)
F/u in 7 weeks , call if you need me sooner  Please schedule mammogram at checkout  Please get fasting CBC, lipid, cmp and eGFr, TSH, vit D and hBA1C in the morning  New for blood pressure is amlodipine 2.5 mg daily, continue spironolactone as before  New to help with weight loss is HALF phentermine daily and you are referred to Bariatric Surgery  It is important that you exercise regularly at least 30 minutes 5 times a week. If you develop chest pain, have severe difficulty breathing, or feel very tired, stop exercising immediately and seek medical attention   Please give 1500 cal diet sheet  Thanks for choosing [facility], we consider it a privelige to serve you.

## 2021-08-04 ENCOUNTER — Encounter: Payer: Self-pay | Admitting: Family Medicine

## 2021-08-04 NOTE — Assessment & Plan Note (Signed)
°  Patient re-educated about  the importance of commitment to a  minimum of 150 minutes of exercise per week as able.  The importance of healthy food choices with portion control discussed, as well as eating regularly and within a 12 hour window most days. The need to choose "clean , green" food 50 to 75% of the time is discussed, as well as to make water the primary drink and set a goal of 64 ounces water daily.    Weight /BMI 07/31/2021 05/21/2021 02/06/2021  WEIGHT 328 lb 333 lb 1.9 oz 333 lb  HEIGHT 5\' 8"  5\' 8"  5\' 8"   BMI 49.87 kg/m2 50.65 kg/m2 50.63 kg/m2    Start half phentermine daily and refer to bariatric surgery

## 2021-08-04 NOTE — Assessment & Plan Note (Signed)
Uncontrolled amlodipine added DASH diet and commitment to daily physical activity for a minimum of 30 minutes discussed and encouraged, as a part of hypertension management. The importance of attaining a healthy weight is also discussed.  BP/Weight 07/31/2021 05/21/2021 05/02/2021 02/06/2021 01/28/2021 01/08/2021 5/79/0383  Systolic BP 338 329 191 660 600 459 977  Diastolic BP 72 90 88 82 85 84 87  Wt. (Lbs) 328 333.12 - 333 - 332 334  BMI 49.87 50.65 - 50.63 - 50.48 50.78

## 2021-08-04 NOTE — Assessment & Plan Note (Signed)
Controlled, no change in medication  

## 2021-08-04 NOTE — Progress Notes (Signed)
° °  Shannon Roth     MRN: [id number]      DOB: [birth year]   HPI Shannon Roth is here for follow up and re-evaluation of chronic medical conditions, medication management and review of any available recent lab and radiology data.  Preventive health is updated, specifically  Cancer screening and Immunization.   Medication prescribed for weight loss is unaffordable, and she wants help with this. Now ready for bariatric surgery. ROS Denies recent fever or chills. Denies sinus pressure, nasal congestion, ear pain or sore throat. Denies chest congestion, productive cough or wheezing. Denies chest pains, palpitations and leg swelling Denies abdominal pain, nausea, vomiting,diarrhea or constipation.   Denies dysuria, frequency, hesitancy or incontinence. Denies joint pain, swelling and limitation in mobility. Denies headaches, seizures, numbness, or tingling. Denies depression, anxiety or insomnia. Denies skin break down or rash.   PE  BP 140/72    Pulse 62    Resp 16    Ht 5\' 8"  (1.727 m)    Wt (!) 328 lb (148.8 kg)    SpO2 98%    BMI 49.87 kg/m   Patient alert and oriented and in no cardiopulmonary distress.  HEENT: No facial asymmetry, EOMI,     Neck supple .  Chest: Clear to auscultation bilaterally.  CVS: S1, S2 no murmurs, no S3.Regular rate.  ABD: Soft non tender.   Ext: No edema  MS: Adequate ROM spine, shoulders, hips and knees.  Skin: Intact, no ulcerations or rash noted.  Psych: Good eye contact, normal affect. Memory intact not anxious or depressed appearing.  CNS: CN 2-12 intact, power,  normal throughout.no focal deficits noted.   Assessment & Plan  Essential hypertension Uncontrolled amlodipine added DASH diet and commitment to daily physical activity for a minimum of 30 minutes discussed and encouraged, as a part of hypertension management. The importance of attaining a healthy weight is also discussed.  BP/Weight 07/31/2021 05/21/2021 05/02/2021 02/06/2021  01/28/2021 01/08/2021 11/12/3014  Systolic BP 010 932 355 732 202 542 706  Diastolic BP 72 90 88 82 85 84 87  Wt. (Lbs) 328 333.12 - 333 - 332 334  BMI 49.87 50.65 - 50.63 - 50.48 50.78       Morbid obesity  Patient re-educated about  the importance of commitment to a  minimum of 150 minutes of exercise per week as able.  The importance of healthy food choices with portion control discussed, as well as eating regularly and within a 12 hour window most days. The need to choose "clean , green" food 50 to 75% of the time is discussed, as well as to make water the primary drink and set a goal of 64 ounces water daily.    Weight /BMI 07/31/2021 05/21/2021 02/06/2021  WEIGHT 328 lb 333 lb 1.9 oz 333 lb  HEIGHT 5\' 8"  5\' 8"  5\' 8"   BMI 49.87 kg/m2 50.65 kg/m2 50.63 kg/m2    Start half phentermine daily and refer to bariatric surgery  GERD (gastroesophageal reflux disease) Controlled, no change in medication

## 2021-08-12 ENCOUNTER — Ambulatory Visit
Admission: RE | Admit: 2021-08-12 | Discharge: 2021-08-12 | Disposition: A | Discharge status: Home / Self Care | Payer: Commercial Managed Care - PPO | Source: Ambulatory Visit | Attending: Family Medicine | Admitting: Family Medicine

## 2021-09-18 ENCOUNTER — Ambulatory Visit (INDEPENDENT_AMBULATORY_CARE_PROVIDER_SITE_OTHER): Payer: Commercial Managed Care - PPO | Admitting: Family Medicine

## 2021-09-18 ENCOUNTER — Encounter: Payer: Self-pay | Admitting: Family Medicine

## 2021-09-18 VITALS — BP 136/90 | HR 66 | Resp 16 | Ht 68.0 in | Wt 331.8 lb

## 2021-09-18 DIAGNOSIS — K219 Gastro-esophageal reflux disease without esophagitis: Secondary | ICD-10-CM | POA: Diagnosis not present

## 2021-09-18 DIAGNOSIS — I10 Essential (primary) hypertension: Secondary | ICD-10-CM

## 2021-09-18 DIAGNOSIS — E559 Vitamin D deficiency, unspecified: Secondary | ICD-10-CM

## 2021-09-18 MED ORDER — AMLODIPINE BESYLATE 5 MG PO TABS: 5.0000 mg | ORAL_TABLET | Freq: Every day | ORAL | 3 refills | Status: DC

## 2021-09-18 NOTE — Patient Instructions (Addendum)
Annual exam in 4  months, re evaluate BP at visit, call if you need me sooner ? ?Labs today already ordered, even if not fasting not a problem, written in Feb, 2023 ? ?It is important that you exercise regularly at least 30 minutes 5 times a week. If you develop chest pain, have severe difficulty breathing, or feel very tired, stop exercising immediately and seek medical attention  ? ?Think about what you will eat, plan ahead. ?Choose " clean, green, fresh or frozen" over canned, processed or packaged foods which are more sugary, salty and fatty. ?70 to 75% of food eaten should be vegetables and fruit. ?Three meals at set times with snacks allowed between meals, but they must be fruit or vegetables. ?Aim to eat over a 12 hour period , example 7 am to 7 pm, and STOP after  your last meal of the day. ?Drink water,generally about 64 ounces per day, no other drink is as healthy. Fruit juice is best enjoyed in a healthy way, by EATING the fruit. ? ?Thanks for choosing [facility], we consider it a privelige to serve you. ? ?

## 2021-09-21 ENCOUNTER — Encounter: Payer: Self-pay | Admitting: Family Medicine

## 2021-09-21 NOTE — Assessment & Plan Note (Signed)
Updated lab needed at/ before next visit.   

## 2021-09-21 NOTE — Assessment & Plan Note (Addendum)
DASH diet and commitment to daily physical activity for a minimum of 30 minutes discussed and encouraged, as a part of hypertension management. ?The importance of attaining a healthy weight is also discussed. ? ? ?  09/18/2021  ?  3:48 PM 09/18/2021  ?  3:24 PM 07/31/2021  ?  3:38 PM 05/21/2021  ?  9:15 AM 05/02/2021  ?  6:36 PM 02/06/2021  ?  1:47 PM 02/06/2021  ?  1:26 PM  ?BP/Weight  ?Systolic BP 491 791 505 697 149 130 148  ?Diastolic BP 90 80 72 90 88 82 76  ?Wt. (Lbs)  331.8 328 333.12   333  ?BMI  50.45 kg/m2 49.87 kg/m2 50.65 kg/m2   50.63 kg/m2  ? ?Improved , no additional medication adjustment ? ? ?

## 2021-09-21 NOTE — Assessment & Plan Note (Signed)
Controlled, no change in medication  

## 2021-09-21 NOTE — Progress Notes (Signed)
? ?Shannon Roth     MRN: [id number]      DOB: Oct 16, 1965 ? ? ?HPI ?Ms. Blansett is here for follow up and re-evaluation of chronic medical conditions, medication management and review of any available recent lab and radiology data.  ?Preventive health is updated, specifically  Cancer screening and Immunization.   ?Questions or concerns regarding consultations or procedures which the PT has had in the interim are  addressed. ?The PT denies any adverse reactions to current medications since the last visit.  ?There are no new concerns.  ?There are no specific complaints  ? ?ROS ?Denies recent fever or chills. ?Denies sinus pressure, nasal congestion, ear pain or sore throat. ?Denies chest congestion, productive cough or wheezing. ?Denies chest pains, palpitations and leg swelling ?Denies abdominal pain, nausea, vomiting,diarrhea or constipation.   ?Denies dysuria, frequency, hesitancy or incontinence. ?Denies joint pain, swelling and limitation in mobility. ?Denies headaches, seizures, numbness, or tingling. ?Denies depression, anxiety or insomnia. ?Denies skin break down or rash. ? ? ?PE ? ?BP 136/90   Pulse 66   Resp 16   Ht '5\' 8"'$  (1.727 m)   Wt (!) 331 lb 12.8 oz (150.5 kg)   SpO2 97%   BMI 50.45 kg/m?  ? ?Patient alert and oriented and in no cardiopulmonary distress. ? ?HEENT: No facial asymmetry, EOMI,     Neck supple . ? ?Chest: Clear to auscultation bilaterally. ? ?CVS: S1, S2 no murmurs, no S3.Regular rate. ? ?ABD: Soft non tender.  ? ?Ext: No edema ? ?MS: Adequate ROM spine, shoulders, hips and knees. ? ?Skin: Intact, no ulcerations or rash noted. ? ?Psych: Good eye contact, normal affect. Memory intact not anxious or depressed appearing. ? ?CNS: CN 2-12 intact, power,  normal throughout.no focal deficits noted. ? ? ?Assessment & Plan ? ?Essential hypertension ?DASH diet and commitment to daily physical activity for a minimum of 30 minutes discussed and encouraged, as a part of hypertension  management. ?The importance of attaining a healthy weight is also discussed. ? ? ?  09/18/2021  ?  3:48 PM 09/18/2021  ?  3:24 PM 07/31/2021  ?  3:38 PM 05/21/2021  ?  9:15 AM 05/02/2021  ?  6:36 PM 02/06/2021  ?  1:47 PM 02/06/2021  ?  1:26 PM  ?BP/Weight  ?Systolic BP 259 563 875 643 149 130 148  ?Diastolic BP 90 80 72 90 88 82 76  ?Wt. (Lbs)  331.8 328 333.12   333  ?BMI  50.45 kg/m2 49.87 kg/m2 50.65 kg/m2   50.63 kg/m2  ? ?Improved , no additional medication adjustment ? ? ? ?Morbid obesity ? ?Patient re-educated about  the importance of commitment to a  minimum of 150 minutes of exercise per week as able. ? ?The importance of healthy food choices with portion control discussed, as well as eating regularly and within a 12 hour window most days. ?The need to choose "clean , green" food 50 to 75% of the time is discussed, as well as to make water the primary drink and set a goal of 64 ounces water daily. ? ?  ? ?  09/18/2021  ?  3:24 PM 07/31/2021  ?  3:38 PM 05/21/2021  ?  9:15 AM  ?Weight /BMI  ?Weight 331 lb 12.8 oz 328 lb 333 lb 1.9 oz  ?Height '5\' 8"'$  (1.727 m) '5\' 8"'$  (1.727 m) '5\' 8"'$  (1.727 m)  ?BMI 50.45 kg/m2 49.87 kg/m2 50.65 kg/m2  ? ? ?Start half phentermine daily ? ?  Vitamin D deficiency ?Updated lab needed at/ before next visit. ? ? ?GERD (gastroesophageal reflux disease) ?Controlled, no change in medication ? ? ?

## 2021-09-21 NOTE — Assessment & Plan Note (Signed)
?  Patient re-educated about  the importance of commitment to a  minimum of 150 minutes of exercise per week as able. ? ?The importance of healthy food choices with portion control discussed, as well as eating regularly and within a 12 hour window most days. ?The need to choose "clean , green" food 50 to 75% of the time is discussed, as well as to make water the primary drink and set a goal of 64 ounces water daily. ? ?  ? ?  09/18/2021  ?  3:24 PM 07/31/2021  ?  3:38 PM 05/21/2021  ?  9:15 AM  ?Weight /BMI  ?Weight 331 lb 12.8 oz 328 lb 333 lb 1.9 oz  ?Height '5\' 8"'$  (1.727 m) '5\' 8"'$  (1.727 m) '5\' 8"'$  (1.727 m)  ?BMI 50.45 kg/m2 49.87 kg/m2 50.65 kg/m2  ? ? ?Start half phentermine daily ?

## 2021-10-14 ENCOUNTER — Ambulatory Visit (INDEPENDENT_AMBULATORY_CARE_PROVIDER_SITE_OTHER): Payer: Commercial Managed Care - PPO | Admitting: Family Medicine

## 2021-10-14 ENCOUNTER — Encounter: Payer: Self-pay | Admitting: Family Medicine

## 2021-10-14 DIAGNOSIS — I10 Essential (primary) hypertension: Secondary | ICD-10-CM | POA: Diagnosis not present

## 2021-10-14 DIAGNOSIS — R5383 Other fatigue: Secondary | ICD-10-CM | POA: Diagnosis not present

## 2021-10-14 DIAGNOSIS — Z72820 Sleep deprivation: Secondary | ICD-10-CM | POA: Insufficient documentation

## 2021-10-14 NOTE — Assessment & Plan Note (Signed)
Gets approx 3 to 4 hrs sleep poer night, sleeps wih grandson, advise parents have their son sleep with them from mid night so she can get longer sleep, c/o being tired ?

## 2021-10-14 NOTE — Assessment & Plan Note (Signed)
Reports chronic fatigue, has uncontrolled hypertension, needs cardilogy eval to include echo, referrd ?

## 2021-10-14 NOTE — Assessment & Plan Note (Signed)
Uncontrolled, needs in office eval ?DASH diet and commitment to daily physical activity for a minimum of 30 minutes discussed and encouraged, as a part of hypertension management. ?The importance of attaining a healthy weight is also discussed. ? ? ?  09/18/2021  ?  3:48 PM 09/18/2021  ?  3:24 PM 07/31/2021  ?  3:38 PM 05/21/2021  ?  9:15 AM 05/02/2021  ?  6:36 PM 02/06/2021  ?  1:47 PM 02/06/2021  ?  1:26 PM  ?BP/Weight  ?Systolic BP 381 829 937 169 149 130 148  ?Diastolic BP 90 80 72 90 88 82 76  ?Wt. (Lbs)  331.8 328 333.12   333  ?BMI  50.45 kg/m2 49.87 kg/m2 50.65 kg/m2   50.63 kg/m2  ? ? ? ? ?

## 2021-10-14 NOTE — Assessment & Plan Note (Signed)
?  Patient re-educated about  the importance of commitment to a  minimum of 150 minutes of exercise per week as able. ? ?The importance of healthy food choices with portion control discussed, as well as eating regularly and within a 12 hour window most days. ?The need to choose "clean , green" food 50 to 75% of the time is discussed, as well as to make water the primary drink and set a goal of 64 ounces water daily. ? ?  ? ?  09/18/2021  ?  3:24 PM 07/31/2021  ?  3:38 PM 05/21/2021  ?  9:15 AM  ?Weight /BMI  ?Weight 331 lb 12.8 oz 328 lb 333 lb 1.9 oz  ?Height '5\' 8"'$  (1.727 m) '5\' 8"'$  (1.727 m) '5\' 8"'$  (1.727 m)  ?BMI 50.45 kg/m2 49.87 kg/m2 50.65 kg/m2  ? ? ? ?

## 2021-10-14 NOTE — Patient Instructions (Addendum)
F/u in office re evaluate blood pressure and energy 3 to to 4 weeks., bring medication to visit ? ?Need labs drawn  ? ?Increase exercise to 30 mins 5 days per week ? ?Change sleep habits, 2 to 3 hrs/ day is  NOT ENOUGH!!!, need 6 to 8 / day ? ?Thanks for choosing [facility], we consider it a privelige to serve you. ? ?

## 2021-10-14 NOTE — Progress Notes (Signed)
Virtual Visit via Telephone Note ? ?I connected with Shannon Roth on 10/14/21 at 11:40 AM EDT by telephone and verified that I am speaking with the correct person using two identifiers. ? ?Location: ?Patient: home ?Provider: office ?  ?I discussed the limitations, risks, security and privacy concerns of performing an evaluation and management service by telephone and the availability of in person appointments. I also discussed with the patient that there may be a patient responsible charge related to this service. The patient expressed understanding and agreed to proceed. ? ? ?History of Present Illness: ?2 month h/o excessive fatigue, getting on average 4 hrs sleep as her grandson sleeps with her. Labs are over 31 year old and need updating ?Blood pressure high t last visit, needs re evaluation and should also get echo cardiogram to  assess heart structure and function ?  ?Observations/Objective: ?There were no vitals taken for this visit. ?Good communication with no confusion and intact memory. ?Alert and oriented x 3 ?No signs of respiratory distress during speech ? ? ?Assessment and Plan: ? ?Essential hypertension ?Uncontrolled, needs in office eval ?DASH diet and commitment to daily physical activity for a minimum of 30 minutes discussed and encouraged, as a part of hypertension management. ?The importance of attaining a healthy weight is also discussed. ? ? ?  09/18/2021  ?  3:48 PM 09/18/2021  ?  3:24 PM 07/31/2021  ?  3:38 PM 05/21/2021  ?  9:15 AM 05/02/2021  ?  6:36 PM 02/06/2021  ?  1:47 PM 02/06/2021  ?  1:26 PM  ?BP/Weight  ?Systolic BP 026 378 588 502 149 130 148  ?Diastolic BP 90 80 72 90 88 82 76  ?Wt. (Lbs)  331.8 328 333.12   333  ?BMI  50.45 kg/m2 49.87 kg/m2 50.65 kg/m2   50.63 kg/m2  ? ? ? ? ? ?Lack of adequate sleep ?Gets approx 3 to 4 hrs sleep poer night, sleeps wih grandson, advise parents have their son sleep with them from mid night so she can get longer sleep, c/o being  tired ? ?Fatigue ?Reports chronic fatigue, has uncontrolled hypertension, needs cardilogy eval to include echo, referrd ? ?Morbid obesity ? ?Patient re-educated about  the importance of commitment to a  minimum of 150 minutes of exercise per week as able. ? ?The importance of healthy food choices with portion control discussed, as well as eating regularly and within a 12 hour window most days. ?The need to choose "clean , green" food 50 to 75% of the time is discussed, as well as to make water the primary drink and set a goal of 64 ounces water daily. ? ?  ? ?  09/18/2021  ?  3:24 PM 07/31/2021  ?  3:38 PM 05/21/2021  ?  9:15 AM  ?Weight /BMI  ?Weight 331 lb 12.8 oz 328 lb 333 lb 1.9 oz  ?Height '5\' 8"'$  (1.727 m) '5\' 8"'$  (1.727 m) '5\' 8"'$  (1.727 m)  ?BMI 50.45 kg/m2 49.87 kg/m2 50.65 kg/m2  ? ? ? ? ?Follow Up Instructions: ? ?  ?I discussed the assessment and treatment plan with the patient. The patient was provided an opportunity to ask questions and all were answered. The patient agreed with the plan and demonstrated an understanding of the instructions. ?  ?The patient was advised to call back or seek an in-person evaluation if the symptoms worsen or if the condition fails to improve as anticipated. ? ?I provided 14 minutes of non-face-to-face time during this  encounter. ? ? ?Tula Nakayama, MD ? ?

## 2021-10-18 ENCOUNTER — Emergency Department (HOSPITAL_COMMUNITY)
Admission: EM | Admit: 2021-10-18 | Discharge: 2021-10-18 | Disposition: A | Discharge status: Home / Self Care | Payer: Commercial Managed Care - PPO | Attending: Emergency Medicine | Admitting: Emergency Medicine

## 2021-10-18 ENCOUNTER — Emergency Department (HOSPITAL_COMMUNITY): Payer: Commercial Managed Care - PPO

## 2021-10-18 ENCOUNTER — Other Ambulatory Visit: Payer: Self-pay

## 2021-10-18 ENCOUNTER — Encounter (HOSPITAL_COMMUNITY): Payer: Self-pay

## 2021-10-18 DIAGNOSIS — Z79899 Other long term (current) drug therapy: Secondary | ICD-10-CM | POA: Insufficient documentation

## 2021-10-18 DIAGNOSIS — I10 Essential (primary) hypertension: Secondary | ICD-10-CM | POA: Insufficient documentation

## 2021-10-18 DIAGNOSIS — N83202 Unspecified ovarian cyst, left side: Secondary | ICD-10-CM | POA: Diagnosis not present

## 2021-10-18 DIAGNOSIS — R1012 Left upper quadrant pain: Secondary | ICD-10-CM

## 2021-10-18 HISTORY — DX: Essential (primary) hypertension: I10

## 2021-10-18 LAB — CBC WITH DIFFERENTIAL/PLATELET
Abs Immature Granulocytes: 0.01 10*3/uL (ref 0.00–0.07)
Basophils Absolute: 0 10*3/uL (ref 0.0–0.1)
Basophils Relative: 0 %
Eosinophils Absolute: 0 10*3/uL (ref 0.0–0.5)
Eosinophils Relative: 1 %
HCT: 37 % (ref 36.0–46.0)
Hemoglobin: 11.1 g/dL — ABNORMAL LOW (ref 12.0–15.0)
Immature Granulocytes: 0 %
Lymphocytes Relative: 33 %
Lymphs Abs: 2.1 10*3/uL (ref 0.7–4.0)
MCH: 23.4 pg — ABNORMAL LOW (ref 26.0–34.0)
MCHC: 30 g/dL (ref 30.0–36.0)
MCV: 77.9 fL — ABNORMAL LOW (ref 80.0–100.0)
Monocytes Absolute: 0.6 10*3/uL (ref 0.1–1.0)
Monocytes Relative: 9 %
Neutro Abs: 3.8 10*3/uL (ref 1.7–7.7)
Neutrophils Relative %: 57 %
Platelets: 307 10*3/uL (ref 150–400)
RBC: 4.75 MIL/uL (ref 3.87–5.11)
RDW: 14.1 % (ref 11.5–15.5)
WBC: 6.5 10*3/uL (ref 4.0–10.5)
nRBC: 0 % (ref 0.0–0.2)

## 2021-10-18 LAB — COMPREHENSIVE METABOLIC PANEL
ALT: 33 U/L (ref 0–44)
AST: 32 U/L (ref 15–41)
Albumin: 4 g/dL (ref 3.5–5.0)
Alkaline Phosphatase: 82 U/L (ref 38–126)
Anion gap: 8 (ref 5–15)
BUN: 12 mg/dL (ref 6–20)
CO2: 25 mmol/L (ref 22–32)
Calcium: 8.9 mg/dL (ref 8.9–10.3)
Chloride: 106 mmol/L (ref 98–111)
Creatinine, Ser: 0.95 mg/dL (ref 0.44–1.00)
GFR, Estimated: >60 mL/min (ref >60)
Glucose, Bld: 101 mg/dL — ABNORMAL HIGH (ref 70–99)
Potassium: 3.8 mmol/L (ref 3.5–5.1)
Sodium: 139 mmol/L (ref 135–145)
Total Bilirubin: 0.6 mg/dL (ref 0.3–1.2)
Total Protein: 7.6 g/dL (ref 6.5–8.1)

## 2021-10-18 LAB — URINALYSIS, ROUTINE W REFLEX MICROSCOPIC
Bacteria, UA: NONE SEEN
Bilirubin Urine: NEGATIVE
Glucose, UA: NEGATIVE mg/dL
Ketones, ur: NEGATIVE mg/dL
Leukocytes,Ua: NEGATIVE
Nitrite: NEGATIVE
Protein, ur: NEGATIVE mg/dL
Specific Gravity, Urine: 1.011 (ref 1.005–1.030)
pH: 6 (ref 5.0–8.0)

## 2021-10-18 LAB — POC URINE PREG, ED: Preg Test, Ur: NEGATIVE

## 2021-10-18 LAB — LIPASE, BLOOD: Lipase: 24 U/L (ref 11–51)

## 2021-10-18 MED ORDER — NAPROXEN 500 MG PO TABS: 500.0000 mg | ORAL_TABLET | Freq: Two times a day (BID) | ORAL | 0 refills | Status: DC

## 2021-10-18 MED ORDER — SODIUM CHLORIDE 0.9 % IV BOLUS
1000.0000 mL | Freq: Once | INTRAVENOUS | Status: AC
  Administered 2021-10-18: 1000 mL via INTRAVENOUS

## 2021-10-18 MED ORDER — ONDANSETRON HCL 4 MG/2ML IJ SOLN
4.0000 mg | Freq: Once | INTRAMUSCULAR | Status: AC
  Administered 2021-10-18: 4 mg via INTRAVENOUS
  Filled 2021-10-18: qty 2

## 2021-10-18 MED ORDER — MORPHINE SULFATE (PF) 4 MG/ML IV SOLN
4.0000 mg | Freq: Once | INTRAVENOUS | Status: AC
  Administered 2021-10-18: 4 mg via INTRAVENOUS
  Filled 2021-10-18: qty 1

## 2021-10-18 MED ORDER — IOHEXOL 300 MG/ML  SOLN
100.0000 mL | Freq: Once | INTRAMUSCULAR | Status: AC | PRN
  Administered 2021-10-18: 100 mL via INTRAVENOUS

## 2021-10-18 NOTE — ED Provider Notes (Signed)
?[place] ?Provider Note ? ? ?CSN: [id number] ?Arrival date & time: 10/18/21  2993 ? ?  ? ?History ? ?Chief Complaint  ?Patient presents with  ? Abdominal Pain  ? ? ?Shannon Roth is a 56 y.o. female. ? ? ?Abdominal Pain ?Associated symptoms: no fever   ?Patient has history of migraines, obesity, hypertension ?Patient presents to the ED with complaints of abdominal pain that started about 1 week ago.  Patient is having an aching pain on the left side of her abdomen.  She has had decreased appetite.  Pain has been increasing in intensity.  She tried to get an appointment with her doctor but was not able to be seen.  She is not having any nausea or vomiting.  She is not having any diarrhea or constipation.  No dysuria.  Patient does have history of prior cholecystectomy and hysterectomy ? ?Home Medications ?Prior to Admission medications   ?Medication Sig Start Date End Date Taking? Authorizing Provider  ?naproxen (NAPROSYN) 500 MG tablet Take 1 tablet (500 mg total) by mouth 2 (two) times daily with a meal. As needed for pain 10/18/21  Yes Dorie Rank, MD  ?amLODipine (NORVASC) 5 MG tablet Take 1 tablet (5 mg total) by mouth daily. 09/18/21   Fayrene Helper, MD  ?Biotin 10 MG CAPS Take 10 mg by mouth daily.    [provider]  ?COLLAGEN PO Take by mouth daily.    [provider]  ?ergocalciferol (VITAMIN D2) 1.25 MG (50000 UT) capsule Take 1 capsule (50,000 Units total) by mouth once a week. One capsule once weekly 10/25/20   Fayrene Helper, MD  ?Flaxseed, Linseed, (FLAX SEEDS) POWD Take by mouth daily.    [provider]  ?fluticasone (FLONASE) 50 MCG/ACT nasal spray Place 1 spray into both nostrils daily for 14 days. 07/11/20 07/25/20  Emerson Monte, FNP  ?omeprazole (PRILOSEC) 20 MG capsule TAKE 1 CAPSULE(20 MG) BY MOUTH DAILY 04/23/21   Fayrene Helper, MD  ?spironolactone (ALDACTONE) 100 MG tablet TAKE 1 TABLET(100 MG) BY MOUTH DAILY 05/22/21    Fayrene Helper, MD  ?tiZANidine (ZANAFLEX) 4 MG tablet Take 1 tablet (4 mg total) by mouth at bedtime. 05/02/21   Jaynee Eagles, PA-C  ?   ? ?Allergies    ?Gabapentin, Tramadol, Codeine, and Penicillins   ? ?Review of Systems   ?Review of Systems  ?Constitutional:  Negative for fever.  ?Gastrointestinal:  Positive for abdominal pain.  ? ?Physical Exam ?Updated Vital Signs ?BP 120/60 (BP Location: Right Arm)   Pulse 60   Temp 98.7 ?F (37.1 ?C) (Oral)   Resp 18   Ht 1.727 m ('5\' 8"'$ )   Wt 122.5 kg   SpO2 99%   BMI 41.05 kg/m?  ?Physical Exam ?Vitals and nursing note reviewed.  ?Constitutional:   ?   Appearance: She is well-developed. She is not diaphoretic.  ?HENT:  ?   Head: Normocephalic and atraumatic.  ?   Right Ear: External ear normal.  ?   Left Ear: External ear normal.  ?Eyes:  ?   General: No scleral icterus.    ?   Right eye: No discharge.     ?   Left eye: No discharge.  ?   Conjunctiva/sclera: Conjunctivae normal.  ?Neck:  ?   Trachea: No tracheal deviation.  ?Cardiovascular:  ?   Rate and Rhythm: Normal rate and regular rhythm.  ?Pulmonary:  ?   Effort: Pulmonary effort is normal. No  respiratory distress.  ?   Breath sounds: Normal breath sounds. No stridor. No wheezing or rales.  ?Abdominal:  ?   General: Bowel sounds are normal. There is no distension.  ?   Palpations: Abdomen is soft.  ?   Tenderness: There is abdominal tenderness in the left upper quadrant and left lower quadrant. There is no guarding or rebound.  ?   Hernia: No hernia is present.  ?Musculoskeletal:     ?   General: No tenderness or deformity.  ?   Cervical back: Neck supple.  ?Skin: ?   General: Skin is warm and dry.  ?   Findings: No rash.  ?Neurological:  ?   General: No focal deficit present.  ?   Mental Status: She is alert.  ?   Cranial Nerves: No cranial nerve deficit (no facial droop, extraocular movements intact, no slurred speech).  ?   Sensory: No sensory deficit.  ?   Motor: No abnormal muscle tone or seizure  activity.  ?   Coordination: Coordination normal.  ?Psychiatric:     ?   Mood and Affect: Mood normal.  ? ? ?ED Results / Procedures / Treatments   ?Labs ?(all labs ordered are listed, but only abnormal results are displayed) ?Labs Reviewed  ?COMPREHENSIVE METABOLIC PANEL - Abnormal; Notable for the following components:  ?    Result Value  ? Glucose, Bld 101 (*)   ? All other components within normal limits  ?CBC WITH DIFFERENTIAL/PLATELET - Abnormal; Notable for the following components:  ? Hemoglobin 11.1 (*)   ? MCV 77.9 (*)   ? MCH 23.4 (*)   ? All other components within normal limits  ?URINALYSIS, ROUTINE W REFLEX MICROSCOPIC - Abnormal; Notable for the following components:  ? Hgb urine dipstick SMALL (*)   ? All other components within normal limits  ?LIPASE, BLOOD  ?POC URINE PREG, ED  ? ? ?EKG ?None ? ?Radiology ?CT ABDOMEN PELVIS W CONTRAST ? ?Result Date: 10/18/2021 ?CLINICAL DATA:  Pain left lower quadrant of abdomen x1 week EXAM: CT ABDOMEN AND PELVIS WITH CONTRAST TECHNIQUE: Multidetector CT imaging of the abdomen and pelvis was performed using the standard protocol following bolus administration of intravenous contrast. RADIATION DOSE REDUCTION: This exam was performed according to the departmental dose-optimization program which includes automated exposure control, adjustment of the mA and/or kV according to patient size and/or use of iterative reconstruction technique. CONTRAST:  112m OMNIPAQUE IOHEXOL 300 MG/ML  SOLN COMPARISON:  MR abdomen done on 12/27/2015 FINDINGS: Lower chest: Unremarkable. Hepatobiliary: There are few low-density lesions in the liver largest measuring 5 cm. In the previous MR abdomen hemangiomas were noted in the liver. There is no dilation of bile ducts. Surgical clips are seen in gallbladder fossa. Pancreas: No focal abnormality is seen. Spleen: Unremarkable. Adrenals/Urinary Tract: Adrenals are not enlarged. There is no hydronephrosis. There is 1.8 cm smooth marginated  fluid density lesion in the upper pole of right kidney suggesting renal cyst. There are no renal or ureteral stones. Urinary bladder is unremarkable. Stomach/Bowel: Small hiatal hernia is seen. Small bowel loops are unremarkable. Appendix is not dilated. There is no significant wall thickening in colon. Vascular/Lymphatic: Unremarkable. Reproductive: Uterus is not seen. There is 2.3 cm fluid density structure in the left adnexa, possibly follicle or functional cyst in the left ovary. Other: There is no ascites or pneumoperitoneum. Musculoskeletal: Degenerative changes are noted in facet joints in the lower lumbar spine. IMPRESSION: There is no evidence of intestinal obstruction  or pneumoperitoneum. Appendix is not dilated. There is no hydronephrosis. There are few low-density lesions in the liver suggesting possible hemangiomas. Right renal cyst. 2.3 cm simple appearing cyst is seen in the left adnexa, possibly follicle. Electronically Signed   By: Elmer Picker M.D.   On: 10/18/2021 11:20   ? ?Procedures ?Procedures  ? ? ?Medications Ordered in ED ?Medications  ?sodium chloride 0.9 % bolus 1,000 mL (0 mLs Intravenous Stopped 10/18/21 1042)  ?ondansetron [facility]) injection 4 mg (4 mg Intravenous Given 10/18/21 0942)  ?morphine (PF) 4 MG/ML injection 4 mg (4 mg Intravenous Given 10/18/21 0943)  ?iohexol (OMNIPAQUE) 300 MG/ML solution 100 mL (100 mLs Intravenous Contrast Given 10/18/21 1107)  ? ? ?ED Course/ Medical Decision Making/ A&P ?Clinical Course as of 10/18/21 1243  ?Sat Oct 18, 2021  ?1040 CBC with Diff(!) ?Normal, hemoglobin stable [JK]  ?1041 Comprehensive metabolic panel(!) ?Normal [JK]  ?[place] ?No acute findings noted on CT scan [JK]  ?1210 Urinalysis, Routine w reflex microscopic Urine, Clean Catch(!) ?Normal [JK]  ?1210 POC urine preg, ED ?Negative [JK]  ?  ?Clinical Course User Index ?[JK] Dorie Rank, MD  ? ?                        ?Medical Decision Making ?Patient is  having significant tenderness on exam.  Differential includes but not limited to diverticulitis, pancreatitis, pyelonephritis, ureteral colic ? ?Problems Addressed: ?Cyst of left ovary: undiagnosed new problem with uncertai

## 2021-10-18 NOTE — ED Triage Notes (Signed)
Patient states LL abdominal pain for one week with lack of appetite recently. Patient ambulatory in triage.  ?

## 2021-10-18 NOTE — Discharge Instructions (Signed)
Take the medications as needed for pain.  Follow-up with your primary care doctor to be rechecked.  Consider seeing an OB/GYN doctor regarding the incidental ovarian cyst ?

## 2021-11-06 IMAGING — DX DG KNEE COMPLETE 4+V*L*
4 series · 4 of 4 positions shown · non-contrast
Comparison: None.

CLINICAL DATA: Medial left knee pain for 2-3 weeks. No known
injury.

EXAM:
LEFT KNEE - COMPLETE 4+ VIEW

[knee ap]
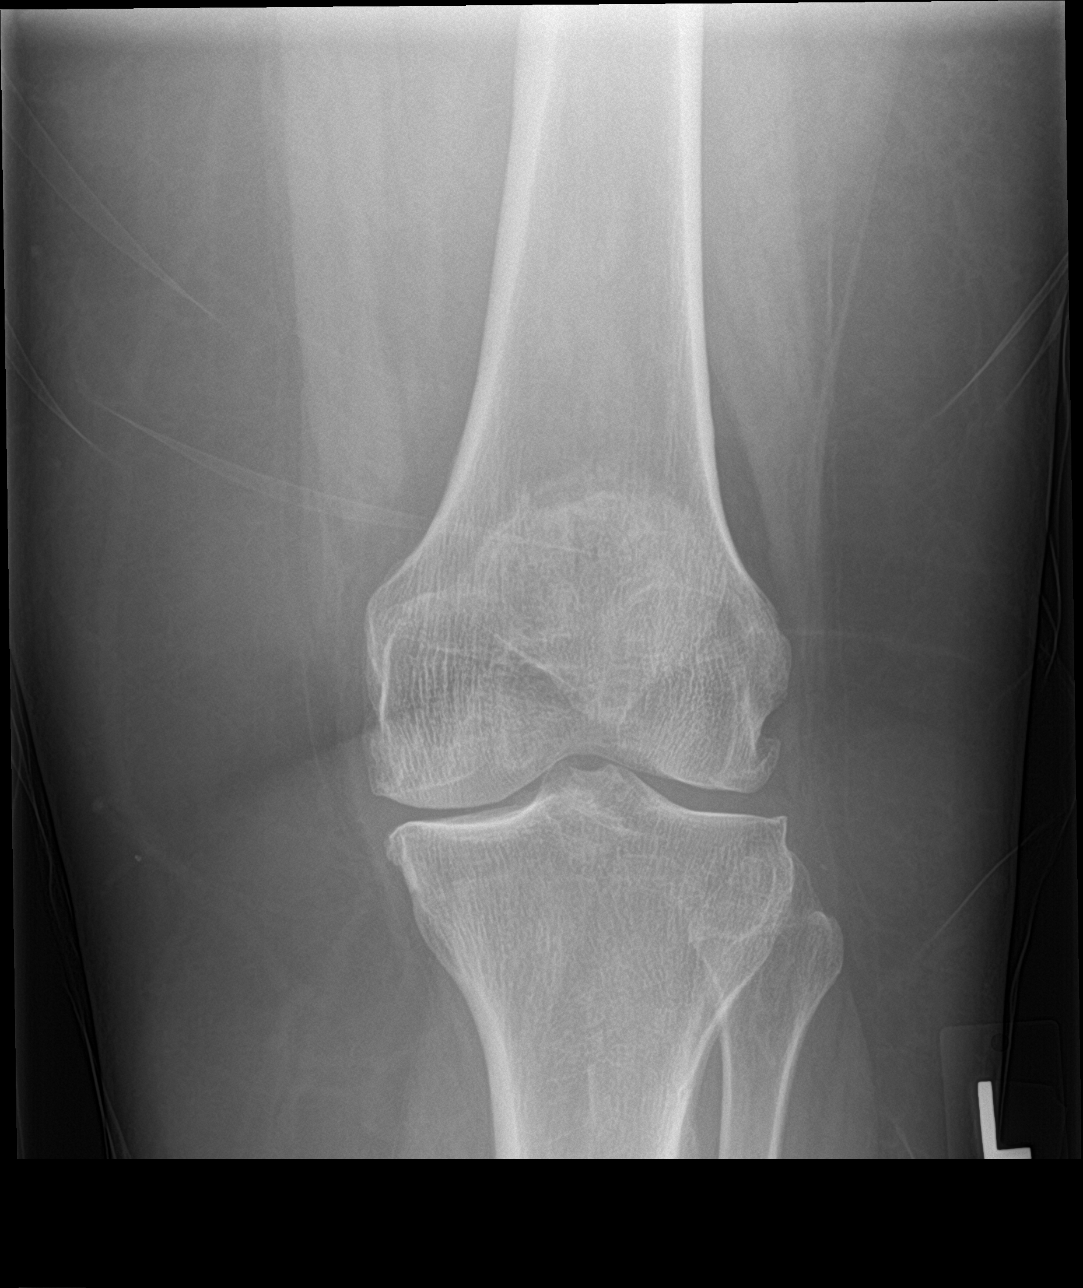

[knee obl (1 of 2)]
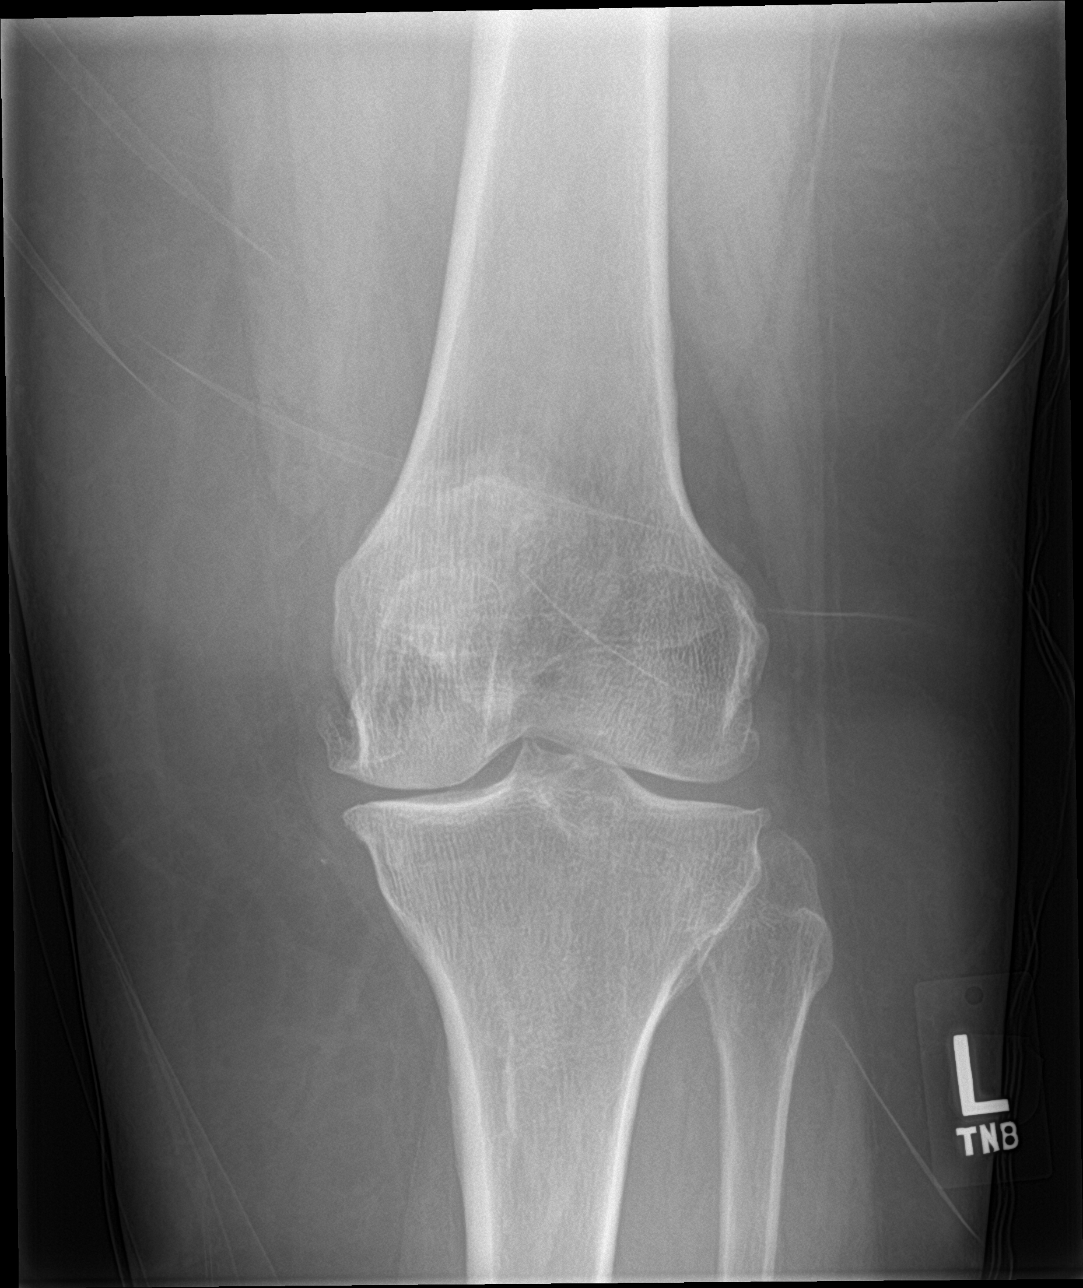

[knee obl (2 of 2)]
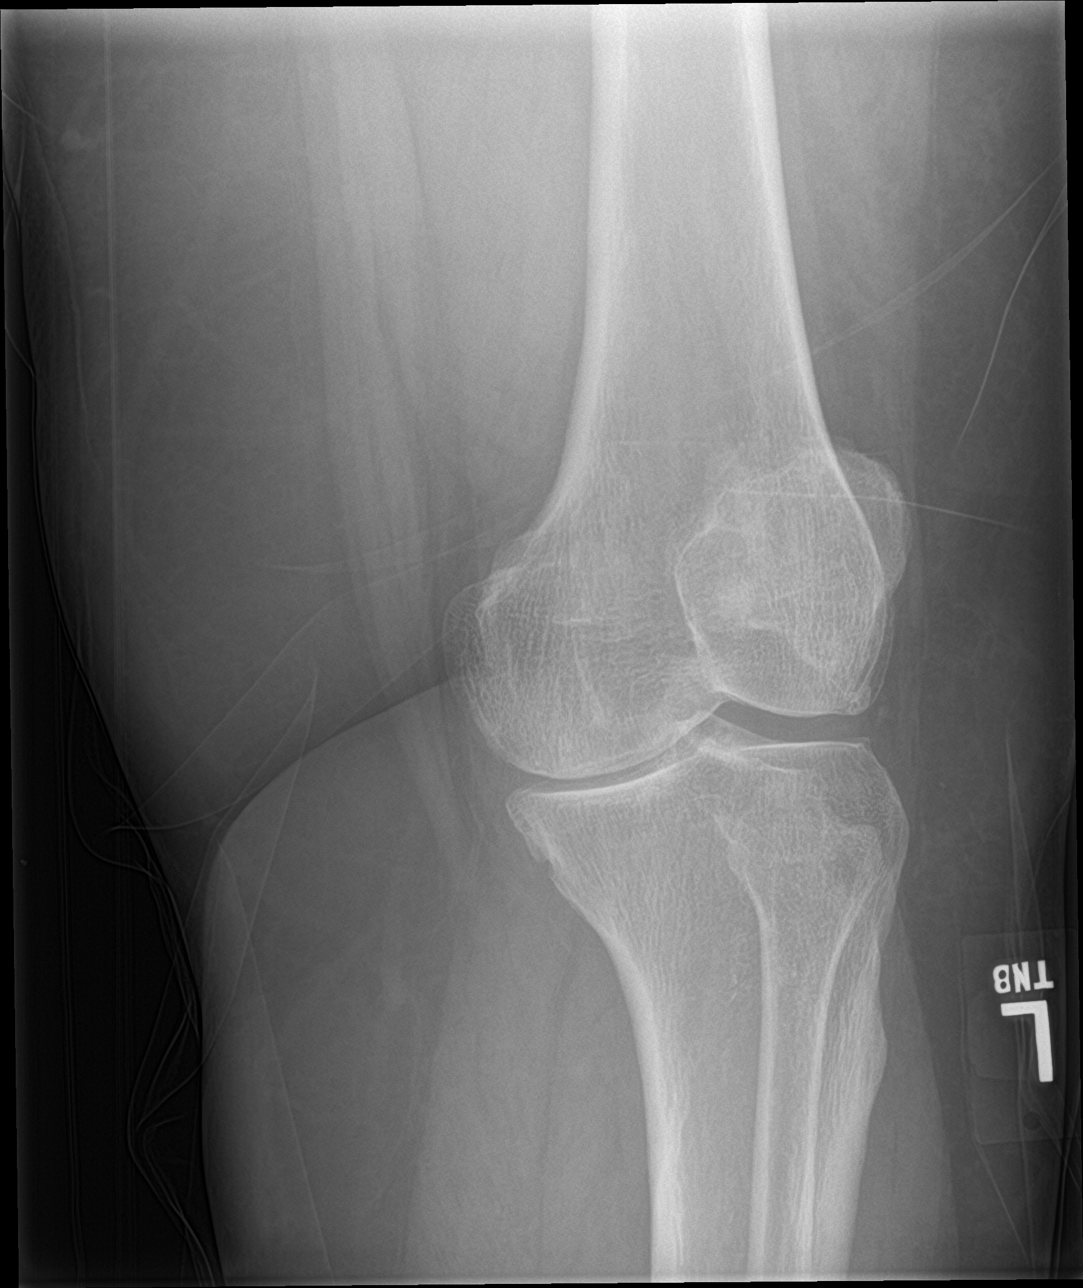

[knee lat]
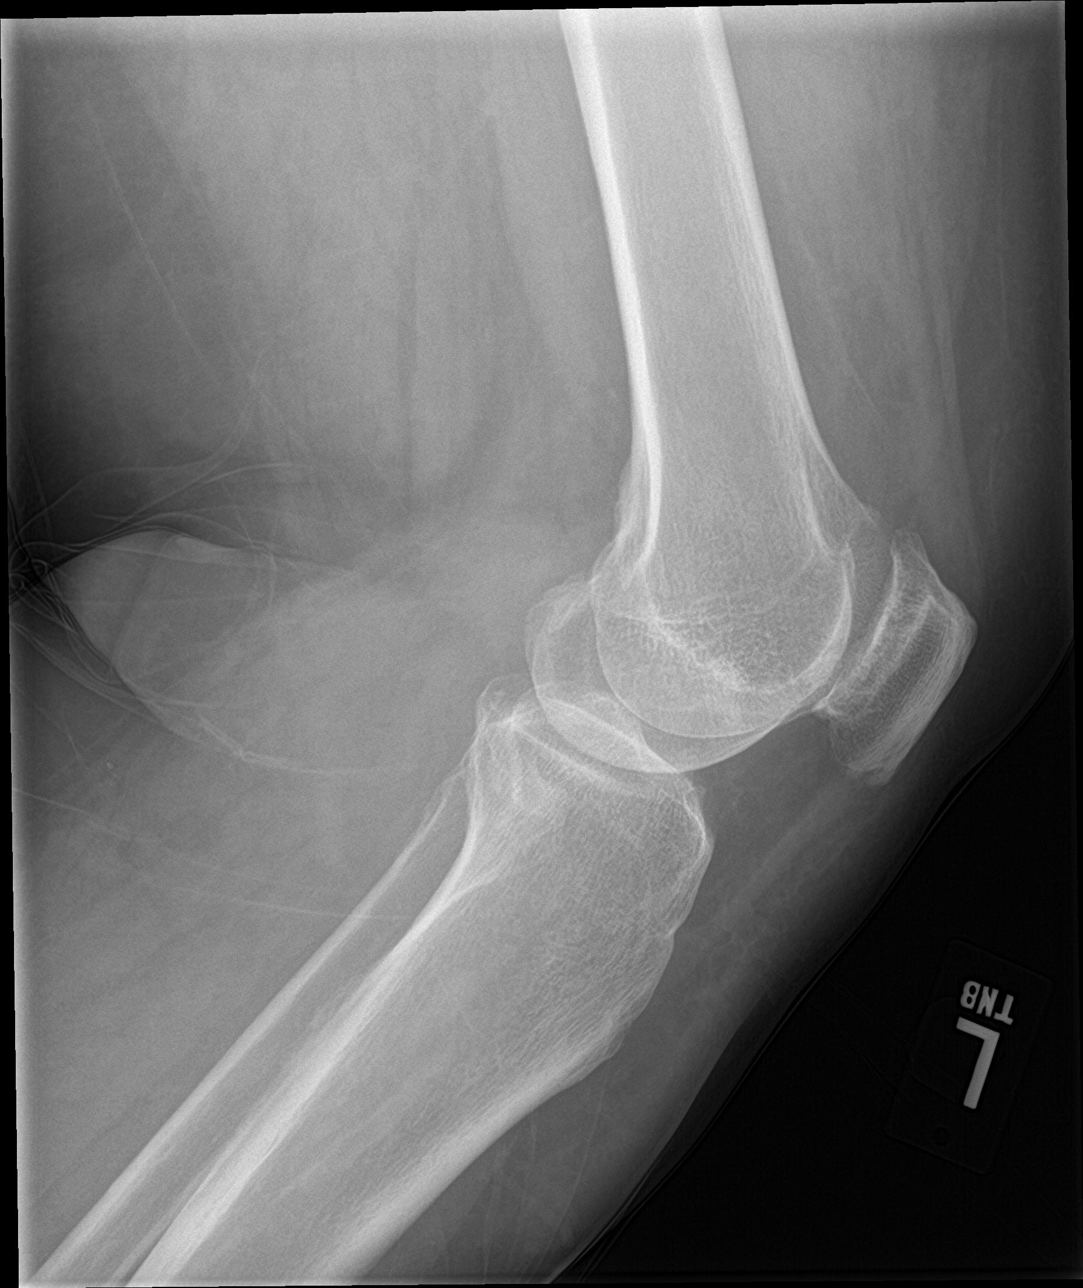

[4 of 4 positions shown; findings below may reference images not displayed]

FINDINGS: There is no acute or focal bony or joint abnormality.
Tricompartmental osteophytosis is seen. Joint space narrowing is
most notable in the medial compartment. No chondrocalcinosis or
joint effusion. Soft tissues are negative.
IMPRESSION: No acute abnormality.

Tricompartmental osteoarthritis appears worst medially.

## 2021-11-11 ENCOUNTER — Encounter: Payer: Self-pay | Admitting: Family Medicine

## 2021-11-11 ENCOUNTER — Ambulatory Visit (INDEPENDENT_AMBULATORY_CARE_PROVIDER_SITE_OTHER): Payer: Commercial Managed Care - PPO | Admitting: Family Medicine

## 2021-11-11 VITALS — BP 124/65 | HR 83 | Resp 16 | Ht 68.0 in | Wt 308.0 lb

## 2021-11-11 DIAGNOSIS — I10 Essential (primary) hypertension: Secondary | ICD-10-CM

## 2021-11-11 DIAGNOSIS — K219 Gastro-esophageal reflux disease without esophagitis: Secondary | ICD-10-CM

## 2021-11-11 MED ORDER — SPIRONOLACTONE 100 MG PO TABS: 100.0000 mg | ORAL_TABLET | Freq: Every day | ORAL | 3 refills | Status: DC

## 2021-11-11 MED ORDER — AMLODIPINE BESYLATE 5 MG PO TABS: 5.0000 mg | ORAL_TABLET | Freq: Every day | ORAL | 1 refills | Status: DC

## 2021-11-11 NOTE — Patient Instructions (Signed)
Re schedule CPE  from July to early September, call if you need me sooner  PLEASE get labs ordered in Feb as soon as possible, past due  Excellent weight loss, and blood pressure  It is important that you exercise regularly at least 30 minutes 5 times a week. If you develop chest pain, have severe difficulty breathing, or feel very tired, stop exercising immediately and seek medical attention   Think about what you will eat, plan ahead. Choose " clean, green, fresh or frozen" over canned, processed or packaged foods which are more sugary, salty and fatty. 70 to 75% of food eaten should be vegetables and fruit. Three meals at set times with snacks allowed between meals, but they must be fruit or vegetables. Aim to eat over a 12 hour period , example 7 am to 7 pm, and STOP after  your last meal of the day. Drink water,generally about 64 ounces per day, no other drink is as healthy. Fruit juice is best enjoyed in a healthy way, by EATING the fruit.  Thanks for choosing [facility], we consider it a privelige to serve you.

## 2021-11-12 ENCOUNTER — Ambulatory Visit (INDEPENDENT_AMBULATORY_CARE_PROVIDER_SITE_OTHER): Payer: Commercial Managed Care - PPO | Admitting: Cardiology

## 2021-11-12 ENCOUNTER — Encounter: Payer: Self-pay | Admitting: Cardiology

## 2021-11-12 DIAGNOSIS — Z8249 Family history of ischemic heart disease and other diseases of the circulatory system: Secondary | ICD-10-CM

## 2021-11-12 DIAGNOSIS — R0602 Shortness of breath: Secondary | ICD-10-CM | POA: Diagnosis not present

## 2021-11-12 NOTE — Assessment & Plan Note (Signed)
With associated fatigue.  Not severe.  We will go ahead and check an echocardiogram to ensure proper structure and function of her heart.  Her brother, a twin, has had several heart attacks and has an ICD pacemaker in place.

## 2021-11-12 NOTE — Progress Notes (Signed)
Cardiology Office Note:    Date:  11/12/2021   ID:  Shannon Roth, DOB 29-Mar-1966, MRN [id number]  PCP:  Fayrene Helper, MD   [facility] Providers Cardiologist:  Candee Furbish, MD     Referring MD: Fayrene Helper, MD    History of Present Illness:    Shannon Roth is a 56 y.o. female here for the evaluation of fatigue at the request of Dr. Tula Nakayama.  In review of her prior office note on 10/14/2021 she has had 2 months of excessive fatigue.  On average she may only get 4 hours of sleep as her grandson sleeps with her.  She has noted mild shortness of breath with activity.  She denies any chest discomfort no fevers chills nausea vomiting syncope bleeding.  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  Very busy.  Mother had CVA.  Father MI in his 69 Twin brother has had several myocardial infarctions, ICD.   Past Medical History:  Diagnosis Date   Arthritis    Cough    Hypertension    Migraine    Migraine 06/08/2000   Obesity    Rectal bleeding     Past Surgical History:  Procedure Laterality Date   bilateral BIOPSY BREAST  2002   benign   Bilateral tubal ligation  2001   BIOPSY  05/17/2018   Procedure: BIOPSY;  Surgeon: Danie Binder, MD;  Location: [facility];  Service: Endoscopy;;  duodenum and gastric   CHOLECYSTECTOMY  2003   COLONOSCOPY N/A 06/21/2015   SLF: hyperplastic polyps removed. next TCS 10 years   ESOPHAGOGASTRODUODENOSCOPY (EGD) WITH PROPOFOL N/A 05/17/2018   Procedure: ESOPHAGOGASTRODUODENOSCOPY (EGD) WITH PROPOFOL;  Surgeon: Danie Binder, MD;  Location: [facility];  Service: Endoscopy;  Laterality: N/A;  1:45pm   EYE SURGERY     right cataract removal    GIVENS CAPSULE STUDY N/A 05/17/2018   Procedure: GIVENS CAPSULE STUDY;  Surgeon: Danie Binder, MD;  Location: [facility];  Service: Endoscopy;  Laterality: N/A;   TOTAL ABDOMINAL HYSTERECTOMY W/ BILATERAL  SALPINGOOPHORECTOMY  2003   TUBAL LIGATION      Current Medications: Current Meds  Medication Sig   ergocalciferol (VITAMIN D2) 1.25 MG (50000 UT) capsule Take 1 capsule (50,000 Units total) by mouth once a week. One capsule once weekly   naproxen (NAPROSYN) 500 MG tablet Take 1 tablet (500 mg total) by mouth 2 (two) times daily with a meal. As needed for pain   omeprazole (PRILOSEC) 20 MG capsule TAKE 1 CAPSULE(20 MG) BY MOUTH DAILY   spironolactone (ALDACTONE) 100 MG tablet Take 1 tablet (100 mg total) by mouth daily.   tiZANidine (ZANAFLEX) 4 MG tablet Take 1 tablet (4 mg total) by mouth at bedtime.     Allergies:   Gabapentin, Tramadol, Codeine, and Penicillins   Social History   Socioeconomic History   Marital status: Single    Spouse name: Not on file   Number of children: 1   Years of education: Not on file   Highest education level: Not on file  Occupational History   Occupation: Social Services  Tobacco Use   Smoking status: Never    Passive exposure: Yes   Smokeless tobacco: Never   Tobacco comments:    Father & close friend  Vaping Use   Vaping Use: Never used  Substance and Sexual Activity   Alcohol use:  No    Alcohol/week: 0.0 standard drinks   Drug use: No   Sexual activity: Not on file  Other Topics Concern   Not on file  Social History Narrative   Originally from [place]. 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 She reports that she wore a full body suit but questionable respiratory equipment for protection. No bird, mold, or hot tub exposure.    Social Determinants of Health   Financial Resource Strain: Not on file  Food Insecurity: Not on file  Transportation Needs: Not on file  Physical Activity: Not on file  Stress: Not on file  Social Connections: Not on file     Family History: The patient's family history includes Arthritis in her mother; Asthma in her mother; Breast cancer in her sister; Colon cancer in her paternal  uncle; Diabetes in her brother and mother; Emphysema in her maternal grandmother and paternal grandfather; Heart disease in her brother and mother; Hypertension in her father; Lung cancer in her maternal grandmother; Stroke in her sister. There is no history of Liver disease.  ROS:   Please see the history of present illness.     All other systems reviewed and are negative.  EKGs/Labs/Other Studies Reviewed:    The following studies were reviewed today: 2 separate CTs reviewed as below.  EKG:  EKG is  ordered today.  The ekg ordered today demonstrates sinus bradycardia 52 no significant changes.  Recent Labs: 10/18/2021: ALT 33; BUN 12; Creatinine, Ser 0.95; Hemoglobin 11.1; Platelets 307; Potassium 3.8; Sodium 139  Recent Lipid Panel    Component Value Date/Time   CHOL 184 10/25/2020 0856   TRIG 121 10/25/2020 0856   HDL 46 10/25/2020 0856   CHOLHDL 4.0 10/25/2020 0856   CHOLHDL 3.7 Ratio 03/09/2009 1850   VLDL 18 03/09/2009 1850   LDLCALC 116 (H) 10/25/2020 0856     Risk Assessment/Calculations:              Physical Exam:    VS:  BP 126/81 (BP Location: Left Arm, Patient Position: Sitting, Cuff Size: Large)   Pulse 97   Ht '5\' 8"'$  (1.727 m)   Wt (!) 307 lb (139.3 kg)   SpO2 (!) 52%   BMI 46.68 kg/m     Wt Readings from Last 3 Encounters:  11/12/21 (!) 307 lb (139.3 kg)  11/11/21 (!) 308 lb (139.7 kg)  10/18/21 270 lb (122.5 kg)     GEN:  Well nourished, well developed in no acute distress HEENT: Normal NECK: No JVD; No carotid bruits LYMPHATICS: No lymphadenopathy CARDIAC: RRR, no murmurs, no rubs, gallops RESPIRATORY:  Clear to auscultation without rales, wheezing or rhonchi  ABDOMEN: Soft, non-tender, non-distended MUSCULOSKELETAL:  No edema; No deformity  SKIN: Warm and dry NEUROLOGIC:  Alert and oriented x 3 PSYCHIATRIC:  Normal affect   ASSESSMENT:    1. Shortness of breath   2. Family history of coronary artery disease   3. Morbid obesity ([place])     PLAN:    In order of problems listed above:  Shortness of breath With associated fatigue.  Not severe.  We will go ahead and check an echocardiogram to ensure proper structure and function of her heart.  Her brother, a twin, has had several heart attacks and has an ICD pacemaker in place.   Family history of coronary artery disease Her twin brother has had several heart attacks, ICD in place.  I offered a coronary calcium score for $99.  At this  time will defer.  I was able to look at a CT of her chest from 2017, personally reviewed and interpreted, I do not see any evidence of coronary artery calcification from that time.  I also looked at another CT of her abdomen recently from 2022 and other than mild right iliac atherosclerosis, there was no evidence of aortic atherosclerosis descending.  I could only see the bottom portion of the right coronary artery.  There was no plaque present.  Morbid obesity Continue with excellent weight loss.  She says she is down approximately 25 pounds.        Medication Adjustments/Labs and Tests Ordered: Current medicines are reviewed at length with the patient today.  Concerns regarding medicines are outlined above.  Orders Placed This Encounter  Procedures   EKG 12-Lead   ECHOCARDIOGRAM COMPLETE   No orders of the defined types were placed in this encounter.   Patient Instructions  Medication Instructions:  Your physician recommends that you continue on your current medications as directed. Please refer to the Current Medication list given to you today.  *If you need a refill on your cardiac medications before your next appointment, please call your pharmacy*  Lab Work: NONE  Testing/Procedures: Your physician has requested that you have an echocardiogram. Echocardiography is a painless test that uses sound waves to create images of your heart. It provides your doctor with information about the size and shape of your heart and how well  your heart's chambers and valves are working. This procedure takes approximately one hour. There are no restrictions for this procedure.   Follow-Up: At [facility], you and your health needs are our priority.  As part of our continuing mission to provide you with exceptional heart care, we have created designated Provider Care Teams.  These Care Teams include your primary Cardiologist (physician) and Advanced Practice Providers (APPs -  Physician Assistants and Nurse Practitioners) who all work together to provide you with the care you need, when you need it.  Your next appointment:   As needed  The format for your next appointment:   In Person  Provider:   Candee Furbish, MD {   Important Information About Sugar         Signed, Candee Furbish, MD  11/12/2021 3:34 PM    [place]

## 2021-11-12 NOTE — Assessment & Plan Note (Signed)
Her twin brother has had several heart attacks, ICD in place.  I offered a coronary calcium score for $99.  At this time will defer.  I was able to look at a CT of her chest from 2017, personally reviewed and interpreted, I do not see any evidence of coronary artery calcification from that time.  I also looked at another CT of her abdomen recently from 2022 and other than mild right iliac atherosclerosis, there was no evidence of aortic atherosclerosis descending.  I could only see the bottom portion of the right coronary artery.  There was no plaque present.

## 2021-11-12 NOTE — Assessment & Plan Note (Signed)
Continue with excellent weight loss.  She says she is down approximately 25 pounds.

## 2021-11-12 NOTE — Patient Instructions (Signed)
Medication Instructions:  Your physician recommends that you continue on your current medications as directed. Please refer to the Current Medication list given to you today.  *If you need a refill on your cardiac medications before your next appointment, please call your pharmacy*  Lab Work: NONE  Testing/Procedures: Your physician has requested that you have an echocardiogram. Echocardiography is a painless test that uses sound waves to create images of your heart. It provides your doctor with information about the size and shape of your heart and how well your heart's chambers and valves are working. This procedure takes approximately one hour. There are no restrictions for this procedure.   Follow-Up: At [facility], you and your health needs are our priority.  As part of our continuing mission to provide you with exceptional heart care, we have created designated Provider Care Teams.  These Care Teams include your primary Cardiologist (physician) and Advanced Practice Providers (APPs -  Physician Assistants and Nurse Practitioners) who all work together to provide you with the care you need, when you need it.  Your next appointment:   As needed  The format for your next appointment:   In Person  Provider:   Candee Furbish, MD {   Important Information About Sugar

## 2021-11-17 ENCOUNTER — Encounter: Payer: Self-pay | Admitting: Family Medicine

## 2021-11-17 NOTE — Assessment & Plan Note (Signed)
  Patient re-educated about  the importance of commitment to a  minimum of 150 minutes of exercise per week as able.  The importance of healthy food choices with portion control discussed, as well as eating regularly and within a 12 hour window most days. The need to choose "clean , green" food 50 to 75% of the time is discussed, as well as to make water the primary drink and set a goal of 64 ounces water daily.       11/12/2021    2:59 PM 11/11/2021    3:43 PM 10/18/2021    8:23 AM  Weight /BMI  Weight 307 lb 308 lb 270 lb  Height '5\' 8"'$  (1.727 m) '5\' 8"'$  (1.727 m) '5\' 8"'$  (1.727 m)  BMI 46.68 kg/m2 46.83 kg/m2 41.05 kg/m2   21 pound weight loss in 4 months, which is excellent , she is applauded on this nd encouraged to continue

## 2021-11-17 NOTE — Progress Notes (Signed)
ARLY SALMINEN     MRN: [id number]      DOB: 1966-01-15   HPI Shannon Roth is here for follow up and re-evaluation of chronic medical conditions, medication management and review of any available recent lab and radiology data.  Preventive health is updated, specifically  Cancer screening and Immunization.   Questions or concerns regarding consultations or procedures which the PT has had in the interim are  addressed. The PT denies any adverse reactions to current medications since the last visit.  There are no new concerns.  There are no specific complaints   ROS Denies recent fever or chills. Denies sinus pressure, nasal congestion, ear pain or sore throat. Denies chest congestion, productive cough or wheezing. Denies chest pains, palpitations and leg swelling Denies abdominal pain, nausea, vomiting,diarrhea or constipation.   Denies dysuria, frequency, hesitancy or incontinence. Denies joint pain, swelling and limitation in mobility. Denies headaches, seizures, numbness, or tingling. Denies depression, anxiety or insomnia. Denies skin break down or rash.   PE  BP 124/65   Pulse 83   Resp 16   Ht '5\' 8"'$  (1.727 m)   Wt (!) 308 lb (139.7 kg)   SpO2 100%   BMI 46.83 kg/m   Patient alert and oriented and in no cardiopulmonary distress.  HEENT: No facial asymmetry, EOMI,     Neck supple .  Chest: Clear to auscultation bilaterally.  CVS: S1, S2 no murmurs, no S3.Regular rate.  ABD: Soft non tender.   Ext: No edema  MS: Adequate ROM spine, shoulders, hips and knees.  Skin: Intact, no ulcerations or rash noted.  Psych: Good eye contact, normal affect. Memory intact not anxious or depressed appearing.  CNS: CN 2-12 intact, power,  normal throughout.no focal deficits noted.   Assessment & Plan  Essential hypertension DASH diet and commitment to daily physical activity for a minimum of 30 minutes discussed and encouraged, as a part of hypertension management. The  importance of attaining a healthy weight is also discussed.     11/12/2021    2:59 PM 11/11/2021    3:43 PM 10/18/2021    1:22 PM 10/18/2021   11:51 AM 10/18/2021   10:00 AM 10/18/2021    9:30 AM 10/18/2021    9:00 AM  BP/Weight  Systolic BP 678 938 101 751 025 852 778  Diastolic BP 81 65 57 60 56 76 76  Wt. (Lbs) 307 308       BMI 46.68 kg/m2 46.83 kg/m2        Controlled, no change in medication     GERD (gastroesophageal reflux disease) Controlled, no change in medication   Morbid obesity  Patient re-educated about  the importance of commitment to a  minimum of 150 minutes of exercise per week as able.  The importance of healthy food choices with portion control discussed, as well as eating regularly and within a 12 hour window most days. The need to choose "clean , green" food 50 to 75% of the time is discussed, as well as to make water the primary drink and set a goal of 64 ounces water daily.       11/12/2021    2:59 PM 11/11/2021    3:43 PM 10/18/2021    8:23 AM  Weight /BMI  Weight 307 lb 308 lb 270 lb  Height '5\' 8"'$  (1.727 m) '5\' 8"'$  (1.727 m) '5\' 8"'$  (1.727 m)  BMI 46.68 kg/m2 46.83 kg/m2 41.05 kg/m2   21 pound weight loss in  4 months, which is excellent , she is applauded on this nd encouraged to continue

## 2021-11-17 NOTE — Assessment & Plan Note (Signed)
Controlled, no change in medication  

## 2021-11-17 NOTE — Assessment & Plan Note (Signed)
DASH diet and commitment to daily physical activity for a minimum of 30 minutes discussed and encouraged, as a part of hypertension management. The importance of attaining a healthy weight is also discussed.     11/12/2021    2:59 PM 11/11/2021    3:43 PM 10/18/2021    1:22 PM 10/18/2021   11:51 AM 10/18/2021   10:00 AM 10/18/2021    9:30 AM 10/18/2021    9:00 AM  BP/Weight  Systolic BP 063 016 010 932 355 732 202  Diastolic BP 81 65 57 60 56 76 76  Wt. (Lbs) 307 308       BMI 46.68 kg/m2 46.83 kg/m2        Controlled, no change in medication

## 2021-12-03 ENCOUNTER — Telehealth (HOSPITAL_COMMUNITY): Payer: Self-pay | Admitting: Cardiology

## 2021-12-03 NOTE — Telephone Encounter (Signed)
Patient called and cancelled echocardiogram for reason below:  Patient is sick with Migranes and will call us back when she is ready to reschedule.   Order will be removed from the active echo wq and if patient calls back to reschedule we will reinstate the order. Thank you.

## 2021-12-03 NOTE — Telephone Encounter (Signed)
Will route this information to ordering Provider and covering RN as an FYI.

## 2021-12-05 ENCOUNTER — Other Ambulatory Visit (HOSPITAL_COMMUNITY): Payer: Commercial Managed Care - PPO

## 2021-12-24 ENCOUNTER — Other Ambulatory Visit: Payer: Self-pay | Admitting: Family Medicine

## 2021-12-26 ENCOUNTER — Encounter: Payer: Commercial Managed Care - PPO | Admitting: Family Medicine

## 2022-02-18 ENCOUNTER — Ambulatory Visit (INDEPENDENT_AMBULATORY_CARE_PROVIDER_SITE_OTHER): Payer: Commercial Managed Care - PPO | Admitting: Family Medicine

## 2022-02-18 ENCOUNTER — Encounter: Payer: Self-pay | Admitting: Family Medicine

## 2022-02-18 VITALS — BP 142/72 | HR 71 | Resp 15 | Ht 68.0 in | Wt 298.0 lb

## 2022-02-18 DIAGNOSIS — K760 Fatty (change of) liver, not elsewhere classified: Secondary | ICD-10-CM

## 2022-02-18 DIAGNOSIS — E559 Vitamin D deficiency, unspecified: Secondary | ICD-10-CM

## 2022-02-18 DIAGNOSIS — Z1211 Encounter for screening for malignant neoplasm of colon: Secondary | ICD-10-CM

## 2022-02-18 DIAGNOSIS — E8881 Metabolic syndrome: Secondary | ICD-10-CM

## 2022-02-18 DIAGNOSIS — Z0000 Encounter for general adult medical examination without abnormal findings: Secondary | ICD-10-CM | POA: Diagnosis not present

## 2022-02-18 DIAGNOSIS — Z13220 Encounter for screening for lipoid disorders: Secondary | ICD-10-CM

## 2022-02-18 DIAGNOSIS — I10 Essential (primary) hypertension: Secondary | ICD-10-CM | POA: Diagnosis not present

## 2022-02-18 DIAGNOSIS — R7301 Impaired fasting glucose: Secondary | ICD-10-CM

## 2022-02-18 MED ORDER — AMLODIPINE BESYLATE 2.5 MG PO TABS: 2.5000 mg | ORAL_TABLET | Freq: Every day | ORAL | 3 refills | Status: DC

## 2022-02-18 NOTE — Patient Instructions (Addendum)
F/U  first week in December , call if you need me sooner Re evaluate Blood pressure  New  additional blood pressure is amlodipine 2.5 mg daily, continue spironolactone 100 mg daily  Nurse visit for flu vaccine next week  PLEASE get fasting labs which are overdue, nurse need to re order  Nurse pls order cologuard   It is important that you exercise regularly at least 30 minutes 5 times a week. If you develop chest pain, have severe difficulty breathing, or feel very tired, stop exercising immediately and seek medical attention   \Think about what you will eat, plan ahead. Choose " clean, green, fresh or frozen" over canned, processed or packaged foods which are more sugary, salty and fatty. 70 to 75% of food eaten should be vegetables and fruit. Three meals at set times with snacks allowed between meals, but they must be fruit or vegetables. Aim to eat over a 12 hour period , example 7 am to 7 pm, and STOP after  your last meal of the day. Drink water,generally about 64 ounces per day, no other drink is as healthy. Fruit juice is best enjoyed in a healthy way, by EATING the fruit. CONGRATS on weight loss, keep it up!

## 2022-02-22 ENCOUNTER — Encounter: Payer: Self-pay | Admitting: Family Medicine

## 2022-02-22 DIAGNOSIS — Z0000 Encounter for general adult medical examination without abnormal findings: Secondary | ICD-10-CM | POA: Insufficient documentation

## 2022-02-22 NOTE — Assessment & Plan Note (Signed)

## 2022-02-22 NOTE — Progress Notes (Signed)
    Shannon Roth     MRN: [id number]      DOB: [birth year]  HPI: Patient is in for annual physical exam. Uncontrolled hypertension is   addressed at the visit. . Immunization is reviewed , will return for flu vaccine   PE: BP (!) 142/72   Pulse 71   Resp 15   Ht '5\' 8"'$  (1.727 m)   Wt 298 lb 0.6 oz (135.2 kg)   SpO2 93%   BMI 45.32 kg/m  Pleasant  female, alert and oriented x 3, in no cardio-pulmonary distress. Afebrile. HEENT No facial trauma or asymetry. Sinuses non tender.  Extra occullar muscles intact.. External ears normal, . Neck: supple, no adenopathy,JVD or thyromegaly.No bruits.  Chest: Clear to ascultation bilaterally.No crackles or wheezes. Non tender to palpation  Cardiovascular system; Heart sounds normal,  S1 and  S2 ,no S3.  No murmur, or thrill.  Peripheral pulses normal.  Abdomen: Soft, non tender, .     Musculoskeletal exam: Full ROM of spine, hips , shoulders and knees. No deformity ,swelling or crepitus noted. No muscle wasting or atrophy.   Neurologic: Cranial nerves 2 to 12 intact. Power, tone ,sensation and reflexes normal throughout. No disturbance in gait. No tremor.  Skin: Intact, no ulceration, erythema , scaling or rash noted. Pigmentation normal throughout  Psych; Normal mood and affect. Judgement and concentration normal   Assessment & Plan:  Encounter for annual physical exam Annual exam as documented. Counseling done  re healthy lifestyle involving commitment to 150 minutes exercise per week, heart healthy diet, and attaining healthy weight.The importance of adequate sleep also discussed. Regular seat belt use and home safety, is also discussed. Changes in health habits are decided on by the patient with goals and time frames  set for achieving them. Immunization and cancer screening needs are specifically addressed at this visit.

## 2022-02-23 ENCOUNTER — Other Ambulatory Visit: Payer: Self-pay

## 2022-02-23 DIAGNOSIS — Z1211 Encounter for screening for malignant neoplasm of colon: Secondary | ICD-10-CM

## 2022-03-02 ENCOUNTER — Encounter: Payer: Self-pay | Admitting: Family Medicine

## 2022-03-02 ENCOUNTER — Ambulatory Visit (INDEPENDENT_AMBULATORY_CARE_PROVIDER_SITE_OTHER): Payer: Commercial Managed Care - PPO | Admitting: Family Medicine

## 2022-03-02 VITALS — BP 137/70 | HR 88 | Ht 68.0 in | Wt 297.0 lb

## 2022-03-02 DIAGNOSIS — B309 Viral conjunctivitis, unspecified: Secondary | ICD-10-CM | POA: Diagnosis not present

## 2022-03-02 MED ORDER — AZELASTINE HCL 0.05 % OP SOLN: 1.0000 [drp] | Freq: Two times a day (BID) | OPHTHALMIC | 12 refills | Status: DC

## 2022-03-02 MED ORDER — AZELASTINE HCL 0.05 % OP SOLN: 1.0000 [drp] | Freq: Two times a day (BID) | OPHTHALMIC | 1 refills | Status: DC

## 2022-03-02 NOTE — Progress Notes (Signed)
   Acute Office Visit  Subjective:     Patient ID: Shannon Roth, female    DOB: 20-May-1966, 56 y.o.   MRN: [id number]  Chief Complaint  Patient presents with   left eye pain    Left eye pain started today 03/02/22 when got up this moring it was blood shot red it has cleared up some since this moring concerned it may be pink eye. Hurts in left eye only.    HPI The patient is in today with c/o of left eye redness and pain that started on 03/02/22. She reports waking up to redness in her left eye. She denies burning, itching, crusting on the lid margin in the morning, and a sensation of grittiness. She reports pharyngitis.  Review of Systems  Constitutional:  Negative for chills and fever.  Eyes:  Positive for redness.  Respiratory:  Negative for shortness of breath.   Cardiovascular:  Negative for chest pain and palpitations.  Skin:  Negative for itching.  Psychiatric/Behavioral:  The patient does not have insomnia.         Objective:    BP 137/70 (BP Location: Left Arm, Patient Position: Sitting)   Pulse 88   Ht '5\' 8"'$  (1.727 m)   Wt 297 lb (134.7 kg)   SpO2 99%   BMI 45.16 kg/m    Physical Exam Eyes:     General:        Left eye: No foreign body or discharge.     Conjunctiva/sclera:     Left eye: Left conjunctiva is injected. No exudate. Cardiovascular:     Rate and Rhythm: Normal rate and regular rhythm.     Pulses: Normal pulses.     Heart sounds: Normal heart sounds.  Pulmonary:     Effort: Pulmonary effort is normal.     Breath sounds: Normal breath sounds.  Neurological:     Mental Status: She is alert.     No results found for any visits on 03/02/22.      Assessment & Plan:   Problem List Items Addressed This Visit       Other   Viral conjunctivitis of left eye - Primary    Will be treated with azelastine 0.05% ophthalmic solution      Relevant Medications   azelastine (OPTIVAR) 0.05 % ophthalmic solution    Meds ordered this encounter   Medications   DISCONTD: azelastine (OPTIVAR) 0.05 % ophthalmic solution    Sig: Place 1 drop into the left eye 2 (two) times daily.    Dispense:  6 mL    Refill:  12   azelastine (OPTIVAR) 0.05 % ophthalmic solution    Sig: Place 1 drop into the left eye 2 (two) times daily.    Dispense:  6 mL    Refill:  1    Return if symptoms worsen or fail to improve.  Alvira Monday, FNP

## 2022-03-02 NOTE — Assessment & Plan Note (Signed)
Will be treated with azelastine 0.05% ophthalmic solution

## 2022-03-02 NOTE — Patient Instructions (Signed)
I appreciate the opportunity to provide care to you today!    Follow up:  With Dr. Moshe Cipro  Please pick up your prescription at the pharmacy     Please continue to a heart-healthy diet and increase your physical activities. Try to exercise for 43mns at least three times a week.      It was a pleasure to see you and I look forward to continuing to work together on your health and well-being. Please do not hesitate to call the office if you need care or have questions about your care.   Have a wonderful day and week. With Gratitude, GAlvira MondayMSN, FNP-BC

## 2022-03-27 LAB — COLOGUARD: COLOGUARD: NEGATIVE

## 2022-04-03 ENCOUNTER — Telehealth: Payer: Self-pay

## 2022-04-03 NOTE — Telephone Encounter (Signed)
Patient calling about test results. Please return patient call.

## 2022-04-03 NOTE — Telephone Encounter (Signed)
LMTRC

## 2022-04-03 NOTE — Telephone Encounter (Signed)
Only cologuard test is available, and is negative, the blood work still needs to be done

## 2022-04-07 NOTE — Telephone Encounter (Signed)
LMTRC

## 2022-05-13 ENCOUNTER — Encounter: Payer: Self-pay | Admitting: Family Medicine

## 2022-05-13 ENCOUNTER — Ambulatory Visit (INDEPENDENT_AMBULATORY_CARE_PROVIDER_SITE_OTHER): Payer: Commercial Managed Care - PPO | Admitting: Family Medicine

## 2022-05-13 ENCOUNTER — Ambulatory Visit (HOSPITAL_COMMUNITY)
Admission: RE | Admit: 2022-05-13 | Discharge: 2022-05-13 | Disposition: A | Discharge status: Home / Self Care | Payer: Commercial Managed Care - PPO | Source: Ambulatory Visit | Attending: Family Medicine | Admitting: Family Medicine

## 2022-05-13 VITALS — BP 162/78 | HR 76 | Temp 99.3°F | Resp 14 | Ht 68.0 in | Wt 298.0 lb

## 2022-05-13 DIAGNOSIS — J309 Allergic rhinitis, unspecified: Secondary | ICD-10-CM | POA: Diagnosis not present

## 2022-05-13 DIAGNOSIS — J209 Acute bronchitis, unspecified: Secondary | ICD-10-CM | POA: Diagnosis present

## 2022-05-13 DIAGNOSIS — U071 COVID-19: Secondary | ICD-10-CM

## 2022-05-13 DIAGNOSIS — I10 Essential (primary) hypertension: Secondary | ICD-10-CM | POA: Diagnosis not present

## 2022-05-13 MED ORDER — PREDNISONE 10 MG (21) PO TBPK: ORAL_TABLET | ORAL | 0 refills | Status: DC

## 2022-05-13 MED ORDER — BENZONATATE 100 MG PO CAPS: 100.0000 mg | ORAL_CAPSULE | Freq: Two times a day (BID) | ORAL | 0 refills | Status: DC | PRN

## 2022-05-13 MED ORDER — IPRATROPIUM BROMIDE 0.02 % IN SOLN
0.5000 mg | Freq: Once | RESPIRATORY_TRACT | Status: AC
  Administered 2022-05-13: 0.5 mg via RESPIRATORY_TRACT

## 2022-05-13 MED ORDER — PROMETHAZINE-DM 6.25-15 MG/5ML PO SYRP: ORAL_SOLUTION | ORAL | 0 refills | Status: DC

## 2022-05-13 MED ORDER — AZITHROMYCIN 250 MG PO TABS: ORAL_TABLET | ORAL | 0 refills | Status: AC

## 2022-05-13 MED ORDER — METHYLPREDNISOLONE ACETATE 80 MG/ML IJ SUSP
80.0000 mg | Freq: Once | INTRAMUSCULAR | Status: AC
  Administered 2022-05-13: 80 mg via INTRAMUSCULAR

## 2022-05-13 MED ORDER — ALBUTEROL SULFATE (2.5 MG/3ML) 0.083% IN NEBU
2.5000 mg | INHALATION_SOLUTION | Freq: Once | RESPIRATORY_TRACT | Status: AC
  Administered 2022-05-13: 2.5 mg via RESPIRATORY_TRACT

## 2022-05-13 NOTE — Assessment & Plan Note (Signed)
Current flare, depo medrol and oral prednisone

## 2022-05-13 NOTE — Patient Instructions (Addendum)
F/u in 3 to 5 weeks , re evaluate blood pressure  Labs, blood work,  already ordered today  Nurse pls document temp and pulse ox  Neb treatment x 1 , duoneb  Depo Medrol 80 mg iM in office  Flu, RSV and covid test today  CXR at hospital today after you leave the office  Prednisone, tessalon perles, azithromycin  and phenergan DM are prescribed. Take all as directed  You are treated for acute bronchitis  Blood pressure is high, PLEASE dO take your b lood pressure medication, even though you do not feel well, you need it  Thanks for choosing [facility], we consider it a privelige to serve you.

## 2022-05-13 NOTE — Assessment & Plan Note (Addendum)
Neb treatment , depo medrol, prednisone, RSV, flu and covid test, cXR, z pack, phenergan dm, neb treatment, CXR

## 2022-05-13 NOTE — Progress Notes (Unsigned)
   Shannon Roth     MRN: [id number]      DOB: 1965/12/31   HPI Shannon Roth is here with a 3 day h/o acute cough , nasal and sinus pressure and drainage, no chills or fever, excess cough ROS Denies recent fever or chills. Denies sinus pressure, nasal congestion, ear pain or sore throat. Denies chest congestion, productive cough or wheezing. Denies chest pains, palpitations and leg swelling Denies abdominal pain, nausea, vomiting,diarrhea or constipation.   Denies dysuria, frequency, hesitancy or incontinence. Denies joint pain, swelling and limitation in mobility. Denies headaches, seizures, numbness, or tingling. Denies depression, anxiety or insomnia. Denies skin break down or rash.   PE  BP (!) 162/78 (BP Location: Left Arm, Patient Position: Sitting, Cuff Size: Large)   Pulse 76   Ht '5\' 8"'$  (1.727 m)   Wt 298 lb 0.6 oz (135.2 kg)   SpO2 96%   BMI 45.32 kg/m   Patient alert and oriented and in no cardiopulmonary distress.Ill appeariong  HEENT: No facial asymmetry, EOMI,     Neck supple .  Chest: decreased air entry , scattered crackles , and  wheezes CVS: S1, S2 no murmurs, no S3.Regular rate.  ABD: Soft non tender.   Ext: No edema  MS: Adequate ROM spine, shoulders, hips and knees.  Skin: Intact, no ulcerations or rash noted.  Psych: Good eye contact, normal affect. Memory intact not anxious or depressed appearing.  CNS: CN 2-12 intact, power,  normal throughout.no focal deficits noted.   Assessment & Plan  Allergic sinusitis Current flare, depo medrol and oral prednisone  Acute bronchitis Neb treatment , depo medrol, prednisone, RSV, flu and covid test, cXR, z pack, phenergan dm, neb treatment, CXR  Essential hypertension Elevated at visit, reports excess use of decongestant and  poor sleep, re assess at next voisit , no med chnage  Lab test positive for detection of COVID-19 virus Paxlovid prescribed and pt notified of result

## 2022-05-14 LAB — CMP14+EGFR
ALT: 13 IU/L (ref 0–32)
AST: 16 IU/L (ref 0–40)
Albumin/Globulin Ratio: 1.3 (ref 1.2–2.2)
Albumin: 4.2 g/dL (ref 3.8–4.9)
Alkaline Phosphatase: 105 IU/L (ref 44–121)
BUN/Creatinine Ratio: 10 (ref 9–23)
BUN: 9 mg/dL (ref 6–24)
Bilirubin Total: 0.4 mg/dL (ref 0.0–1.2)
CO2: 23 mmol/L (ref 20–29)
Calcium: 9.3 mg/dL (ref 8.7–10.2)
Chloride: 102 mmol/L (ref 96–106)
Creatinine, Ser: 0.91 mg/dL (ref 0.57–1.00)
Globulin, Total: 3.3 g/dL (ref 1.5–4.5)
Glucose: 85 mg/dL (ref 70–99)
Potassium: 3.5 mmol/L (ref 3.5–5.2)
Sodium: 143 mmol/L (ref 134–144)
Total Protein: 7.5 g/dL (ref 6.0–8.5)
eGFR: 74 mL/min/{1.73_m2} (ref >59)

## 2022-05-14 LAB — LIPID PANEL
Chol/HDL Ratio: 3.6 ratio (ref 0.0–4.4)
Cholesterol, Total: 188 mg/dL (ref 100–199)
HDL: 52 mg/dL (ref >39)
LDL Chol Calc (NIH): 116 mg/dL — ABNORMAL HIGH (ref 0–99)
Triglycerides: 112 mg/dL (ref 0–149)
VLDL Cholesterol Cal: 20 mg/dL (ref 5–40)

## 2022-05-14 LAB — CBC
Hematocrit: 38.2 % (ref 34.0–46.6)
Hemoglobin: 11.5 g/dL (ref 11.1–15.9)
MCH: 22.7 pg — ABNORMAL LOW (ref 26.6–33.0)
MCHC: 30.1 g/dL — ABNORMAL LOW (ref 31.5–35.7)
MCV: 75 fL — ABNORMAL LOW (ref 79–97)
Platelets: 308 10*3/uL (ref 150–450)
RBC: 5.07 x10E6/uL (ref 3.77–5.28)
RDW: 13.6 % (ref 11.7–15.4)
WBC: 6.7 10*3/uL (ref 3.4–10.8)

## 2022-05-14 LAB — TSH: TSH: 0.773 u[IU]/mL (ref 0.450–4.500)

## 2022-05-14 LAB — HEMOGLOBIN A1C
Est. average glucose Bld gHb Est-mCnc: 105 mg/dL
Hgb A1c MFr Bld: 5.3 % (ref 4.8–5.6)

## 2022-05-14 LAB — VITAMIN D 25 HYDROXY (VIT D DEFICIENCY, FRACTURES): Vit D, 25-Hydroxy: 12.4 ng/mL — ABNORMAL LOW (ref 30.0–100.0)

## 2022-05-15 LAB — COVID-19, FLU A+B AND RSV
Influenza A, NAA: NOT DETECTED
Influenza B, NAA: NOT DETECTED
RSV, NAA: NOT DETECTED
SARS-CoV-2, NAA: DETECTED — AB

## 2022-05-15 MED ORDER — ERGOCALCIFEROL 1.25 MG (50000 UT) PO CAPS: 50000.0000 [IU] | ORAL_CAPSULE | ORAL | 2 refills | Status: DC

## 2022-05-15 MED ORDER — NIRMATRELVIR/RITONAVIR (PAXLOVID)TABLET: 3.0000 | ORAL_TABLET | Freq: Two times a day (BID) | ORAL | 0 refills | Status: AC

## 2022-05-17 ENCOUNTER — Encounter: Payer: Self-pay | Admitting: Family Medicine

## 2022-05-17 DIAGNOSIS — U071 COVID-19: Secondary | ICD-10-CM | POA: Insufficient documentation

## 2022-05-17 NOTE — Assessment & Plan Note (Signed)
Elevated at visit, reports excess use of decongestant and  poor sleep, re assess at next voisit , no med chnage

## 2022-05-17 NOTE — Assessment & Plan Note (Signed)
Paxlovid prescribed and pt notified of result

## 2022-06-02 ENCOUNTER — Other Ambulatory Visit: Payer: Self-pay | Admitting: Family Medicine

## 2022-06-04 ENCOUNTER — Other Ambulatory Visit: Payer: Self-pay

## 2022-06-04 ENCOUNTER — Telehealth: Payer: Self-pay | Admitting: Family Medicine

## 2022-06-04 MED ORDER — NAPROXEN 500 MG PO TABS: 500.0000 mg | ORAL_TABLET | Freq: Two times a day (BID) | ORAL | 0 refills | Status: DC

## 2022-06-04 NOTE — Telephone Encounter (Signed)
Prescription Request  06/04/2022  Is this a "Controlled Substance" medicine? No  LOV: 05/13/2022  What is the name of the medication or equipment? naproxen (NAPROSYN) 500 MG tablet   Have you contacted your pharmacy to request a refill? Yes   Which pharmacy would you like this sent to?  [facility] [phone] - [place], [place] [address] [place] [address] Phone: [phone] Fax: [phone]    Patient notified that their request is being sent to the clinical staff for review and that they should receive a response within 2 business days.   Please advise at [place]

## 2022-06-04 NOTE — Telephone Encounter (Signed)
Refills sent

## 2022-06-19 ENCOUNTER — Ambulatory Visit (INDEPENDENT_AMBULATORY_CARE_PROVIDER_SITE_OTHER): Payer: Commercial Managed Care - PPO | Admitting: Family Medicine

## 2022-06-19 ENCOUNTER — Encounter: Payer: Self-pay | Admitting: Family Medicine

## 2022-06-19 VITALS — BP 120/84 | HR 58 | Ht 68.0 in | Wt 297.1 lb

## 2022-06-19 DIAGNOSIS — I10 Essential (primary) hypertension: Secondary | ICD-10-CM

## 2022-06-19 DIAGNOSIS — E559 Vitamin D deficiency, unspecified: Secondary | ICD-10-CM

## 2022-06-19 DIAGNOSIS — Z1239 Encounter for other screening for malignant neoplasm of breast: Secondary | ICD-10-CM

## 2022-06-19 MED ORDER — AMLODIPINE BESYLATE 2.5 MG PO TABS: 2.5000 mg | ORAL_TABLET | Freq: Every day | ORAL | 11 refills | Status: DC

## 2022-06-19 MED ORDER — PHENTERMINE HCL 37.5 MG PO TABS: 37.5000 mg | ORAL_TABLET | Freq: Every day | ORAL | 1 refills | Status: DC

## 2022-06-19 NOTE — Patient Instructions (Addendum)
F/U in 14 weeks, call if you need me sooner  Start HALF phentermine daily to help with weight loss ( script says 1 daily)  Goal of 10 pound weight loss  No pepsi, no snacks! , No sweets, little bit/ smaller portions  of white food which is  sugar that is not sweet  Lots of color Iis GREAT, red , orange, purple , green, fruit and healthy protein, beans  Pls sched mamm[facility] at [facility]   Success with school!  It is important that you exercise regularly at least 30 minutes 5 times a week. If you develop chest pain, have severe difficulty breathing, or feel very tired, stop exercising immediately and seek medical attention   Thanks for choosing [facility], we consider it a privelige to serve you.

## 2022-06-21 ENCOUNTER — Encounter: Payer: Self-pay | Admitting: Family Medicine

## 2022-06-21 NOTE — Progress Notes (Signed)
Shannon Roth     MRN: [id number]      DOB: August 12, 1965   HPI Shannon Roth is here for follow up and re-evaluation of chronic medical conditions, medication management and review of any available recent lab and radiology data.  Preventive health is updated, specifically  Cancer screening and Immunization.   Questions or concerns regarding consultations or procedures which the PT has had in the interim are  addressed. The PT denies any adverse reactions to current medications since the last visit.  There are no new concerns.  There are no specific complaints   ROS Denies recent fever or chills. Denies sinus pressure, nasal congestion, ear pain or sore throat. Denies chest congestion, productive cough or wheezing. Denies chest pains, palpitations and leg swelling Denies abdominal pain, nausea, vomiting,diarrhea or constipation.   Denies dysuria, frequency, hesitancy or incontinence. Denies joint pain, swelling and limitation in mobility. Denies headaches, seizures, numbness, or tingling. Denies depression, anxiety or insomnia. Denies skin break down or rash.   PE  BP 120/84   Pulse (!) 58   Ht '5\' 8"'$  (1.727 m)   Wt 297 lb 1.3 oz (134.8 kg)   SpO2 97%   BMI 45.17 kg/m   Patient alert and oriented and in no cardiopulmonary distress.  HEENT: No facial asymmetry, EOMI,     Neck supple .  Chest: Clear to auscultation bilaterally.  CVS: S1, S2 no murmurs, no S3.Regular rate.  ABD: Soft non tender.   Ext: No edema  MS: Adequate ROM spine, shoulders, hips and knees.  Skin: Intact, no ulcerations or rash noted.  Psych: Good eye contact, normal affect. Memory intact not anxious or depressed appearing.  CNS: CN 2-12 intact, power,  normal throughout.no focal deficits noted.   Assessment & Plan  Essential hypertension Controlled, no change in medication DASH diet and commitment to daily physical activity for a minimum of 30 minutes discussed and encouraged, as a part of  hypertension management. The importance of attaining a healthy weight is also discussed.     06/19/2022   10:17 AM 06/19/2022    9:55 AM 06/19/2022    9:53 AM 05/13/2022    8:56 AM 05/13/2022    8:53 AM 03/02/2022    2:50 PM 02/18/2022    4:02 PM  BP/Weight  Systolic BP 937 169 678 938 101 751 025  Diastolic BP 84 75 83 78 91 70 72  Wt. (Lbs)   297.08  298.04 297 298.04  BMI   45.17 kg/m2  45.32 kg/m2 45.16 kg/m2 45.32 kg/m2       Vitamin D deficiency Updated lab needed at/ before next visit.   Morbid obesity Start half phentermine daily, and f/u in 12 weeks  Patient re-educated about  the importance of commitment to a  minimum of 150 minutes of exercise per week as able.  The importance of healthy food choices with portion control discussed, as well as eating regularly and within a 12 hour window most days. The need to choose "clean , green" food 50 to 75% of the time is discussed, as well as to make water the primary drink and set a goal of 64 ounces water daily.       06/19/2022    9:53 AM 05/13/2022    8:53 AM 03/02/2022    2:50 PM  Weight /BMI  Weight 297 lb 1.3 oz 298 lb 0.6 oz 297 lb  Height '5\' 8"'$  (1.727 m) '5\' 8"'$  (1.727 m) '5\' 8"'$  (1.727 m)  BMI 45.17 kg/m2 45.32 kg/m2 45.16 kg/m2

## 2022-06-21 NOTE — Assessment & Plan Note (Signed)
Controlled, no change in medication DASH diet and commitment to daily physical activity for a minimum of 30 minutes discussed and encouraged, as a part of hypertension management. The importance of attaining a healthy weight is also discussed.     06/19/2022   10:17 AM 06/19/2022    9:55 AM 06/19/2022    9:53 AM 05/13/2022    8:56 AM 05/13/2022    8:53 AM 03/02/2022    2:50 PM 02/18/2022    4:02 PM  BP/Weight  Systolic BP 537 482 707 867 544 920 100  Diastolic BP 84 75 83 78 91 70 72  Wt. (Lbs)   297.08  298.04 297 298.04  BMI   45.17 kg/m2  45.32 kg/m2 45.16 kg/m2 45.32 kg/m2

## 2022-06-21 NOTE — Assessment & Plan Note (Signed)
Start half phentermine daily, and f/u in 12 weeks  Patient re-educated about  the importance of commitment to a  minimum of 150 minutes of exercise per week as able.  The importance of healthy food choices with portion control discussed, as well as eating regularly and within a 12 hour window most days. The need to choose "clean , green" food 50 to 75% of the time is discussed, as well as to make water the primary drink and set a goal of 64 ounces water daily.       06/19/2022    9:53 AM 05/13/2022    8:53 AM 03/02/2022    2:50 PM  Weight /BMI  Weight 297 lb 1.3 oz 298 lb 0.6 oz 297 lb  Height '5\' 8"'$  (1.727 m) '5\' 8"'$  (1.727 m) '5\' 8"'$  (1.727 m)  BMI 45.17 kg/m2 45.32 kg/m2 45.16 kg/m2

## 2022-06-21 NOTE — Assessment & Plan Note (Signed)
Updated lab needed at/ before next visit.   

## 2022-07-17 ENCOUNTER — Telehealth: Payer: Self-pay | Admitting: Family Medicine

## 2022-07-17 ENCOUNTER — Other Ambulatory Visit: Payer: Self-pay

## 2022-07-17 DIAGNOSIS — R252 Cramp and spasm: Secondary | ICD-10-CM

## 2022-07-17 NOTE — Telephone Encounter (Signed)
Pt called stating she is on BP med & is having very painful leg cramps. States it hurts to sit. Wants to know what can be done for this?     [place]

## 2022-07-17 NOTE — Telephone Encounter (Signed)
Patient aware labs ordered and appt made

## 2022-07-21 LAB — BMP8+EGFR
BUN/Creatinine Ratio: 15 (ref 9–23)
BUN: 13 mg/dL (ref 6–24)
CO2: 20 mmol/L (ref 20–29)
Calcium: 9.4 mg/dL (ref 8.7–10.2)
Chloride: 105 mmol/L (ref 96–106)
Creatinine, Ser: 0.87 mg/dL (ref 0.57–1.00)
Glucose: 84 mg/dL (ref 70–99)
Potassium: 4.2 mmol/L (ref 3.5–5.2)
Sodium: 141 mmol/L (ref 134–144)
eGFR: 78 mL/min/{1.73_m2} (ref >59)

## 2022-07-21 LAB — MAGNESIUM: Magnesium: 2.1 mg/dL (ref 1.6–2.3)

## 2022-07-23 ENCOUNTER — Encounter: Payer: Self-pay | Admitting: Family Medicine

## 2022-07-23 ENCOUNTER — Ambulatory Visit (INDEPENDENT_AMBULATORY_CARE_PROVIDER_SITE_OTHER): Payer: Commercial Managed Care - PPO | Admitting: Family Medicine

## 2022-07-23 VITALS — BP 135/68 | Ht 68.0 in | Wt 305.1 lb

## 2022-07-23 DIAGNOSIS — R252 Cramp and spasm: Secondary | ICD-10-CM | POA: Diagnosis not present

## 2022-07-23 DIAGNOSIS — I10 Essential (primary) hypertension: Secondary | ICD-10-CM | POA: Diagnosis not present

## 2022-07-23 NOTE — Patient Instructions (Signed)
F/U re evaluate blood pressure In  8 weeks  STOP amlodipine, let me see if this will reduce cramps, call in 1 week to let me know  No phentermine now  If cramps much less then I will start a new medication to replace the amlodipine  Stay on spironolactone 100 mg daily please   Blood work is excellent  "Tonic Water " in small quantities several times per day if needed, may help the cramps  Thanks for choosing [facility], we consider it a privelige to serve you.

## 2022-07-26 ENCOUNTER — Encounter: Payer: Self-pay | Admitting: Family Medicine

## 2022-07-26 DIAGNOSIS — R252 Cramp and spasm: Secondary | ICD-10-CM | POA: Insufficient documentation

## 2022-07-26 NOTE — Progress Notes (Signed)
Shannon Roth     MRN: [id number]      DOB: 03/18/1966   HPI [name] is here for follow up and re-evaluation of chronic medical conditions, medication management and review of any available recent lab and radiology data.  Preventive health is updated, specifically  Cancer screening and Immunization.   Questions or concerns regarding consultations or procedures which the PT has had in the interim are  addressed. C/o severe muscle cramps which are debilitating thinks it is her new BP med,had cramps before, but not as severe Mustard of no benefit   ROS Denies recent fever or chills. Denies sinus pressure, nasal congestion, ear pain or sore throat. Denies chest congestion, productive cough or wheezing. Denies chest pains, palpitations and leg swelling Denies abdominal pain, nausea, vomiting,diarrhea or constipation.   Denies dysuria, frequency, hesitancy or incontinence. . Denies headaches, seizures, numbness, or tingling. Denies depression, anxiety or insomnia. Denies skin break down or rash.   PE  BP 135/68 (BP Location: Right Arm, Patient Position: Sitting, Cuff Size: Large)   Ht 5' 8"$  (1.727 m)   Wt (!) 305 lb 1.9 oz (138.4 kg)   BMI 46.39 kg/m   Patient alert and oriented and in no cardiopulmonary distress.  HEENT: No facial asymmetry, EOMI,     Neck supple .  Chest: Clear to auscultation bilaterally.  CVS: S1, S2 no murmurs, no S3.Regular rate.  ABD: Soft non tender.   Ext: No edema  MS: Adequate ROM spine, shoulders, hips and knees.  Skin: Intact, no ulcerations or rash noted.  Psych: Good eye contact, normal affect. Memory intact not anxious or depressed appearing.  CNS: CN 2-12 intact, power,  normal throughout.no focal deficits noted.   Assessment & Plan  Essential hypertension Controlled hoever needs to stop amlodipine to see if this is cause of cramps, send message after 1 week DASH diet and commitment to daily physical activity for a minimum of  30 minutes discussed and encouraged, as a part of hypertension management. The importance of attaining a healthy weight is also discussed.     07/23/2022    2:09 PM 06/19/2022   10:17 AM 06/19/2022    9:55 AM 06/19/2022    9:53 AM 05/13/2022    8:56 AM 05/13/2022    8:53 AM 03/02/2022    2:50 PM  BP/Weight  Systolic BP A999333 [id number] 0000000  Diastolic BP 68 84 75 83 78 91 70  Wt. (Lbs) 305.12   297.08  298.04 297  BMI 46.39 kg/m2   45.17 kg/m2  45.32 kg/m2 45.16 kg/m2       Morbid obesity Deteriorated , n o phentermine until BP controlled   Patient re-educated about  the importance of commitment to a  minimum of 150 minutes of exercise per week as able.  The importance of healthy food choices with portion control discussed, as well as eating regularly and within a 12 hour window most days. The need to choose "clean , green" food 50 to 75% of the time is discussed, as well as to make water the primary drink and set a goal of 64 ounces water daily.       07/23/2022    2:09 PM 06/19/2022    9:53 AM 05/13/2022    8:53 AM  Weight /BMI  Weight 305 lb 1.9 oz 297 lb 1.3 oz 298 lb 0.6 oz  Height 5' 8"$  (1.727 m) 5' 8"$  (1.727 m) 5' 8"$  (1.727 m)  BMI  46.39 kg/m2 45.17 kg/m2 45.32 kg/m2      Muscle cramps Debilitating occurs both in the day and at night Hold amlodipine and re eval, small quantities of tonic water recommended and muscle stretches, potassium , magnesium and electrolytes are all normal

## 2022-07-26 NOTE — Assessment & Plan Note (Signed)
Debilitating occurs both in the day and at night Hold amlodipine and re eval, small quantities of tonic water recommended and muscle stretches, potassium , magnesium and electrolytes are all normal

## 2022-07-26 NOTE — Assessment & Plan Note (Signed)
Controlled hoever needs to stop amlodipine to see if this is cause of cramps, send message after 1 week DASH diet and commitment to daily physical activity for a minimum of 30 minutes discussed and encouraged, as a part of hypertension management. The importance of attaining a healthy weight is also discussed.     07/23/2022    2:09 PM 06/19/2022   10:17 AM 06/19/2022    9:55 AM 06/19/2022    9:53 AM 05/13/2022    8:56 AM 05/13/2022    8:53 AM 03/02/2022    2:50 PM  BP/Weight  Systolic BP A999333 123456 [id number] 0000000  Diastolic BP 68 84 75 83 78 91 70  Wt. (Lbs) 305.12   297.08  298.04 297  BMI 46.39 kg/m2   45.17 kg/m2  45.32 kg/m2 45.16 kg/m2

## 2022-07-26 NOTE — Assessment & Plan Note (Signed)
Deteriorated , n o phentermine until BP controlled   Patient re-educated about  the importance of commitment to a  minimum of 150 minutes of exercise per week as able.  The importance of healthy food choices with portion control discussed, as well as eating regularly and within a 12 hour window most days. The need to choose "clean , green" food 50 to 75% of the time is discussed, as well as to make water the primary drink and set a goal of 64 ounces water daily.       07/23/2022    2:09 PM 06/19/2022    9:53 AM 05/13/2022    8:53 AM  Weight /BMI  Weight 305 lb 1.9 oz 297 lb 1.3 oz 298 lb 0.6 oz  Height 5' 8"$  (1.727 m) 5' 8"$  (1.727 m) 5' 8"$  (1.727 m)  BMI 46.39 kg/m2 45.17 kg/m2 45.32 kg/m2

## 2022-08-06 ENCOUNTER — Encounter: Payer: Self-pay | Admitting: Radiology

## 2022-08-14 ENCOUNTER — Encounter: Payer: Self-pay | Admitting: Family Medicine

## 2022-08-14 DIAGNOSIS — Z1231 Encounter for screening mammogram for malignant neoplasm of breast: Secondary | ICD-10-CM

## 2022-08-18 ENCOUNTER — Other Ambulatory Visit: Payer: Self-pay | Admitting: Family Medicine

## 2022-09-14 ENCOUNTER — Other Ambulatory Visit: Payer: Self-pay | Admitting: Family Medicine

## 2022-09-15 ENCOUNTER — Telehealth: Payer: Self-pay | Admitting: Family Medicine

## 2022-09-15 NOTE — Telephone Encounter (Signed)
Med refill needs prior authorization   naproxen (NAPROSYN) 500 MG tablet [[id number]   Pharmacy  [facility] [phone] - [place], [place] - [address] [place] [address], [place] [address] Phone: [phone]  Fax: [phone] DEA #: [id number]

## 2022-09-16 ENCOUNTER — Other Ambulatory Visit: Payer: Self-pay

## 2022-09-16 MED ORDER — NAPROXEN 500 MG PO TABS: 500.0000 mg | ORAL_TABLET | Freq: Two times a day (BID) | ORAL | 0 refills | Status: DC

## 2022-09-16 NOTE — Telephone Encounter (Signed)
Med refilled.

## 2022-09-25 ENCOUNTER — Encounter: Payer: Self-pay | Admitting: Family Medicine

## 2022-09-25 ENCOUNTER — Ambulatory Visit (INDEPENDENT_AMBULATORY_CARE_PROVIDER_SITE_OTHER): Payer: Commercial Managed Care - PPO | Admitting: Family Medicine

## 2022-09-25 VITALS — BP 148/88 | HR 58 | Resp 16 | Ht 67.0 in | Wt 318.0 lb

## 2022-09-25 DIAGNOSIS — E559 Vitamin D deficiency, unspecified: Secondary | ICD-10-CM | POA: Diagnosis not present

## 2022-09-25 DIAGNOSIS — I10 Essential (primary) hypertension: Secondary | ICD-10-CM | POA: Diagnosis not present

## 2022-09-25 MED ORDER — OLMESARTAN MEDOXOMIL 20 MG PO TABS: 20.0000 mg | ORAL_TABLET | Freq: Every day | ORAL | 3 refills | Status: DC

## 2022-09-25 NOTE — Patient Instructions (Addendum)
F/U in 8 to 9 weeks , re evaluate blood pressure , clal if you need me sooner  Nurse please f/u with pt on mammogram which  was ordered, she states order is wrong, not sure about that, she does need her mammogram  Additional medication olmesartan 20 mg daily added to spironolactone for blood pressure, take BOTH medications every day at the same time, since your blod pressure is still too high  Try to work on controlling food choice despite being stressed so that you start losing weight again  VERY best with school and exams!  Non fasting chem 7 and EGFr, 3 to 5 days before next appt  Pls commit to 64 ounces water daily  Please commit to vegetables and fruits mainly, with healthy protein like beans and white meat baked or grilled  It is important that you exercise regularly at least 30 minutes 5 times a week. If you develop chest pain, have severe difficulty breathing, or feel very tired, stop exercising immediately and seek medical attention    Thanks for choosing [facility], we consider it a privelige to serve you.

## 2022-09-25 NOTE — Assessment & Plan Note (Signed)
Unconrolled , add olmesartn 20 mg daily DASH diet and commitment to daily physical activity for a minimum of 30 minutes discussed and encouraged, as a part of hypertension management. The importance of attaining a healthy weight is also discussed.     09/25/2022    9:46 AM 07/23/2022    2:09 PM 06/19/2022   10:17 AM 06/19/2022    9:55 AM 06/19/2022    9:53 AM 05/13/2022    8:56 AM 05/13/2022    8:53 AM  BP/Weight  Systolic BP 148 135 120 129 163 162 168  Diastolic BP 88 68 84 75 83 78 91  Wt. (Lbs) 318 305.12   297.08  298.04  BMI 49.81 kg/m2 46.39 kg/m2   45.17 kg/m2  45.32 kg/m2

## 2022-09-28 ENCOUNTER — Encounter: Payer: Self-pay | Admitting: Family Medicine

## 2022-09-28 NOTE — Assessment & Plan Note (Signed)
Continue weekly vit D 

## 2022-09-28 NOTE — Progress Notes (Signed)
Shannon Roth     MRN: [id number]      DOB: 1965-06-20   HPI Ms. Leiphart is here for follow up and re-evaluation of chronic medical conditions, medication management and review of any available recent lab and radiology data.  Preventive health is updated, specifically  Cancer screening and Immunization.   Questions or concerns regarding consultations or procedures which the PT has had in the interim are  addressed. The PT denies any adverse reactions to current medications since the last visit.  There are no new concerns.  There are no specific complaints   ROS Denies recent fever or chills. Denies sinus pressure, nasal congestion, ear pain or sore throat. Denies chest congestion, productive cough or wheezing. Denies chest pains, palpitations and leg swelling Denies abdominal pain, nausea, vomiting,diarrhea or constipation.   Denies dysuria, frequency, hesitancy or incontinence. Denies joint pain, swelling and limitation in mobility. Denies headaches, seizures, numbness, or tingling. Denies depression, anxiety or insomnia. Denies skin break down or rash.   PE  BP (!) 148/88   Pulse (!) 58   Resp 16   Ht  (1.702 m)   Wt (!) 318 lb (144.2 kg)   SpO2 96%   BMI 49.81 kg/m   Patient alert and oriented and in no cardiopulmonary distress.  HEENT: No facial asymmetry, EOMI,     Neck supple .  Chest: Clear to auscultation bilaterally.  CVS: S1, S2 no murmurs, no S3.Regular rate.  ABD: Soft non tender.   Ext: No edema  MS: Adequate ROM spine, shoulders, hips and knees.  Skin: Intact, no ulcerations or rash noted.  Psych: Good eye contact, normal affect. Memory intact not anxious or depressed appearing.  CNS: CN 2-12 intact, power,  normal throughout.no focal deficits noted.   Assessment & Plan Essential hypertension Unconrolled , add olmesartn 20 mg daily DASH diet and commitment to daily physical activity for a minimum of 30 minutes discussed and encouraged, as  a part of hypertension management. The importance of attaining a healthy weight is also discussed.     09/25/2022    9:46 AM 07/23/2022    2:09 PM 06/19/2022   10:17 AM 06/19/2022    9:55 AM 06/19/2022    9:53 AM 05/13/2022    8:56 AM 05/13/2022    8:53 AM  BP/Weight  Systolic BP 148 135 120 129 163 162 168  Diastolic BP 88 68 84 75 83 78 91  Wt. (Lbs) 318 305.12   297.08  298.04  BMI 49.81 kg/m2 46.39 kg/m2   45.17 kg/m2  45.32 kg/m2       Morbid obesity  Patient re-educated about  the importance of commitment to a  minimum of 150 minutes of exercise per week as able.  The importance of healthy food choices with portion control discussed, as well as eating regularly and within a 12 hour window most days. The need to choose "clean , green" food 50 to 75% of the time is discussed, as well as to make water the primary drink and set a goal of 64 ounces water daily.       09/25/2022    9:46 AM 07/23/2022    2:09 PM 06/19/2022    9:53 AM  Weight /BMI  Weight 318 lb 305 lb 1.9 oz 297 lb 1.3 oz  Height  (1.702 m)  (1.727 m)  (1.727 m)  BMI 49.81 kg/m2 46.39 kg/m2 45.17 kg/m2      Vitamin D deficiency  Continue weekly vit D

## 2022-09-28 NOTE — Assessment & Plan Note (Signed)
  Patient re-educated about  the importance of commitment to a  minimum of 150 minutes of exercise per week as able.  The importance of healthy food choices with portion control discussed, as well as eating regularly and within a 12 hour window most days. The need to choose "clean , green" food 50 to 75% of the time is discussed, as well as to make water the primary drink and set a goal of 64 ounces water daily.       09/25/2022    9:46 AM 07/23/2022    2:09 PM 06/19/2022    9:53 AM  Weight /BMI  Weight 318 lb 305 lb 1.9 oz 297 lb 1.3 oz  Height  (1.702 m)  (1.727 m)  (1.727 m)  BMI 49.81 kg/m2 46.39 kg/m2 45.17 kg/m2

## 2022-10-22 ENCOUNTER — Other Ambulatory Visit: Payer: Self-pay | Admitting: Family Medicine

## 2022-10-24 ENCOUNTER — Ambulatory Visit
Admission: EM | Admit: 2022-10-24 | Discharge: 2022-10-24 | Disposition: A | Discharge status: Home / Self Care | Payer: Commercial Managed Care - PPO | Attending: Nurse Practitioner | Admitting: Nurse Practitioner

## 2022-10-24 DIAGNOSIS — J029 Acute pharyngitis, unspecified: Secondary | ICD-10-CM | POA: Diagnosis present

## 2022-10-24 DIAGNOSIS — J069 Acute upper respiratory infection, unspecified: Secondary | ICD-10-CM

## 2022-10-24 DIAGNOSIS — Z1152 Encounter for screening for COVID-19: Secondary | ICD-10-CM | POA: Diagnosis not present

## 2022-10-24 LAB — POCT RAPID STREP A (OFFICE): Rapid Strep A Screen: NEGATIVE

## 2022-10-24 MED ORDER — PROMETHAZINE-DM 6.25-15 MG/5ML PO SYRP: 5.0000 mL | ORAL_SOLUTION | Freq: Four times a day (QID) | ORAL | 0 refills | Status: DC | PRN

## 2022-10-24 MED ORDER — LIDOCAINE VISCOUS HCL 2 % MT SOLN: 5.0000 mL | Freq: Four times a day (QID) | OROMUCOSAL | 0 refills | Status: DC | PRN

## 2022-10-24 MED ORDER — FLUTICASONE PROPIONATE 50 MCG/ACT NA SUSP: 2.0000 | Freq: Every day | NASAL | 0 refills | Status: AC

## 2022-10-24 NOTE — ED Triage Notes (Signed)
Pt c/o cough, head congestion, and sore throat.  Home interventions: OTC cough medications  Started: Tuesday/Wednesday

## 2022-10-24 NOTE — Discharge Instructions (Addendum)
The rapid strep test was negative.  A throat culture and COVID test are pending.  You will be contacted if the pending test results are positive.  If you have access to MyChart, you will also be able to see the results there. Take medication as prescribed. May take over-the-counter Tylenol as needed for pain, discomfort. Warm salt water gargles 3-4 times daily while throat pain persist. Recommend a soft diet to include soup, broth, yogurt, Jell-O, or applesauce while throat pain is present.  You can also try warm or cool liquids for throat pain or discomfort. Recommend using a humidifier in your bedroom at nighttime during sleep or sleeping elevated on pillows while cough symptoms persist. If symptoms do not improve over the next 5 to 7 days, or suddenly worsen before that time, please follow-up in this clinic with your primary care physician for further evaluation. Follow-up as needed.

## 2022-10-24 NOTE — ED Provider Notes (Signed)
[facility]    CSN: [id number] Arrival date & time: 10/24/22  0808      History   Chief Complaint Chief Complaint  Patient presents with   Cough   Nasal Congestion   Sore Throat    HPI Shannon Roth is a 57 y.o. female.   The history is provided by the patient.   The patient presents with a 3-day history of head congestion, nasal congestion, postnasal drainage, sore throat, and a dry cough.  Patient states she is unsure if she has had a fever, but reports she has had episodes of feeling "hot and cold".  Patient states she has increased throat pain with swallowing.  Patient states she was outside in the rain 1 day before her symptoms started.  Patient denies ear pain, ear drainage, headache, wheezing, shortness of breath, difficulty breathing, abdominal pain, nausea, vomiting, or diarrhea.  Patient reports she has been taking over-the-counter medications such as TheraFlu and NyQuil with minimal relief.  Patient states that her grandchild was also sick with the same or similar symptoms.  Past Medical History:  Diagnosis Date   Arthritis    Cough    Hypertension    Migraine    Migraine 06/08/2000   Obesity    Rectal bleeding     Patient Active Problem List   Diagnosis Date Noted   Muscle cramps 07/26/2022   Lab test positive for detection of COVID-19 virus 05/17/2022   Encounter for annual physical exam 02/22/2022   Family history of coronary artery disease 11/12/2021   Lack of adequate sleep 10/14/2021   Fatigue 10/14/2021   Chronic pain in left foot 05/21/2021   Allergic sinusitis 01/12/2021   Skin lesion of breast 10/25/2020   Vitamin D deficiency 01/12/2020   Presbycusis of both ears 01/08/2020   Essential hypertension 12/19/2019   Foot pain, bilateral 12/19/2019   IBS (irritable bowel syndrome) 09/06/2019   Fatty liver 05/05/2017   Elevated ferritin 05/05/2017   Headache 09/03/2016   Knee pain, right 09/03/2016   Abnormal CT of liver 12/24/2015    Microcytic anemia 12/24/2015   Liver lesion, right lobe 12/19/2015   H/O asbestos exposure 12/03/2015   GERD (gastroesophageal reflux disease) 12/03/2015   Shortness of breath 12/03/2015   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 next TCS 10 years   ESOPHAGOGASTRODUODENOSCOPY (EGD) WITH PROPOFOL N/A 05/17/2018   Procedure: ESOPHAGOGASTRODUODENOSCOPY (EGD) WITH PROPOFOL;  Surgeon: West Bali, MD;  Location: [facility];  Service: Endoscopy;  Laterality: N/A;  1:45pm   EYE SURGERY     right cataract removal    GIVENS CAPSULE STUDY N/A 05/17/2018   Procedure: GIVENS CAPSULE STUDY;  Surgeon: West Bali, MD;  Location: [facility];  Service: Endoscopy;  Laterality: N/A;   TOTAL ABDOMINAL HYSTERECTOMY W/ BILATERAL SALPINGOOPHORECTOMY  2003   TUBAL LIGATION  OB History   No obstetric history on file.      Home Medications    Prior to Admission medications   Medication Sig Start Date End Date Taking? Authorizing Provider  fluticasone (FLONASE) 50 MCG/ACT nasal spray Place 2 sprays into both nostrils daily. 10/24/22  Yes Virga Haltiwanger-Warren, Sadie Haber, NP  lidocaine (XYLOCAINE) 2 % solution Use as directed 5 mLs in the mouth or throat every 6 (six) hours as needed for mouth pain. Gargle and spit 5 mL every 6 hours as needed for throat pain or discomfort. 10/24/22  Yes Kerith Sherley-Warren, Sadie Haber, NP   promethazine-dextromethorphan (PROMETHAZINE-DM) 6.25-15 MG/5ML syrup Take 5 mLs by mouth 4 (four) times daily as needed for cough. 10/24/22  Yes Chau Savell-Warren, Sadie Haber, NP  ergocalciferol (VITAMIN D2) 1.25 MG (50000 UT) capsule Take 1 capsule (50,000 Units total) by mouth once a week. One capsule once weekly 05/15/22   Kerri Perches, MD  naproxen (NAPROSYN) 500 MG tablet Take 1 tablet (500 mg total) by mouth 2 (two) times daily with a meal. As needed for pain 09/16/22   Kerri Perches, MD  olmesartan (BENICAR) 20 MG tablet Take 1 tablet (20 mg total) by mouth daily. 09/25/22   Kerri Perches, MD  omeprazole (PRILOSEC) 20 MG capsule TAKE 1 CAPSULE(20 MG) BY MOUTH DAILY 10/22/22   Kerri Perches, MD  spironolactone (ALDACTONE) 100 MG tablet TAKE 1 TABLET(100 MG) BY MOUTH DAILY 08/18/22   Kerri Perches, MD    Family History Family History  Problem Relation Age of Onset   Asthma Mother    Diabetes Mother    Arthritis Mother    Heart disease Mother    Hypertension Father    Emphysema Paternal Grandfather    Emphysema Maternal Grandmother    Lung cancer Maternal Grandmother    Breast cancer Sister    Stroke Sister    Diabetes Brother    Heart disease Brother    Colon cancer Paternal Uncle        54s   Liver disease Neg Hx     Social History Social History   Tobacco Use   Smoking status: Never    Passive exposure: Yes   Smokeless tobacco: Never   Tobacco comments:    Father & close friend  Vaping Use   Vaping Use: Never used  Substance Use Topics   Alcohol use: No    Alcohol/week: 0.0 standard drinks of alcohol   Drug use: No     Allergies   Penicillin g, Tramadol, Codeine, Gabapentin, and Penicillins   Review of Systems Review of Systems Per HPI  Physical Exam Triage Vital Signs ED Triage Vitals  Enc Vitals Group     BP 10/24/22 0825 (!) 140/79     Pulse Rate 10/24/22 0825 71     Resp 10/24/22 0825 20     Temp 10/24/22 0825 98.5 F (36.9  C)     Temp Source 10/24/22 0825 Oral     SpO2 10/24/22 0825 97 %     Weight --      Height --      Head Circumference --      Peak Flow --      Pain Score 10/24/22 0823 8     Pain Loc --      Pain Edu? --      Excl. in GC? --    No data found.  Updated Vital Signs BP (!) 140/79 (BP Location: Right Arm)  Pulse 71   Temp 98.5 F (36.9 C) (Oral)   Resp 20   SpO2 97%   Visual Acuity Right Eye Distance:   Left Eye Distance:   Bilateral Distance:    Right Eye Near:   Left Eye Near:    Bilateral Near:     Physical Exam Constitutional:      General: She is not in acute distress.    Appearance: She is well-developed.  HENT:     Head: Normocephalic and atraumatic.     Right Ear: Tympanic membrane and ear canal normal.     Left Ear: Tympanic membrane and ear canal normal.     Nose: Congestion present.     Right Turbinates: Enlarged and swollen.     Left Turbinates: Enlarged and swollen.     Right Sinus: Maxillary sinus tenderness and frontal sinus tenderness present.     Left Sinus: Maxillary sinus tenderness and frontal sinus tenderness present.     Mouth/Throat:     Lips: Pink.     Mouth: Mucous membranes are moist.     Pharynx: Uvula midline. Pharyngeal swelling and posterior oropharyngeal erythema present. No oropharyngeal exudate or uvula swelling.     Tonsils: 1+ on the right. 1+ on the left.     Comments: Cobblestoning present on posterior oropharynx Eyes:     Conjunctiva/sclera: Conjunctivae normal.     Pupils: Pupils are equal, round, and reactive to light.  Neck:     Thyroid: No thyromegaly.     Trachea: No tracheal deviation.  Cardiovascular:     Rate and Rhythm: Normal rate and regular rhythm.     Heart sounds: Normal heart sounds.  Pulmonary:     Effort: Pulmonary effort is normal.     Breath sounds: Normal breath sounds.  Abdominal:     General: Bowel sounds are normal. There is no distension.     Palpations: Abdomen is soft.     Tenderness:  There is no abdominal tenderness.  Musculoskeletal:     Cervical back: Normal range of motion and neck supple.  Skin:    General: Skin is warm and dry.  Neurological:     General: No focal deficit present.     Mental Status: She is alert and oriented to person, place, and time.  Psychiatric:        Mood and Affect: Mood normal.        Behavior: Behavior normal.        Thought Content: Thought content normal.        Judgment: Judgment normal.      UC Treatments / Results  Labs (all labs ordered are listed, but only abnormal results are displayed) Labs Reviewed  SARS CORONAVIRUS 2 (TAT 6-24 HRS)  CULTURE, GROUP A STREP [facility])  POCT RAPID STREP A (OFFICE)    EKG   Radiology No results found.  Procedures Procedures (including critical care time)  Medications Ordered in UC Medications - No data to display  Initial Impression / Assessment and Plan / UC Course  I have reviewed the triage vital signs and the nursing notes.  Pertinent labs & imaging results that were available during my care of the patient were reviewed by me and considered in my medical decision making (see chart for details).  The patient is well-appearing, she is in no acute distress, she is mildly hypertensive, but vital signs are otherwise stable.  Rapid strep test was negative, throat culture and COVID test are pending.  Patient is  a candidate to receive Paxlovid if her COVID test is positive.  Reviewed patient's most recent lab work dated 07/20/2022, creatinine was 0.87, lipid profile was within normal limits.  Differential diagnoses include viral upper respiratory infection with cough, viral sinusitis, and COVID.  Will treat patient symptomatically while further testing is pending.  Patient was prescribed Promethazine DM for her cough, viscous lidocaine 2% to gargle and spit for throat pain or discomfort, and fluticasone 50 mcg nasal spray for nasal congestion, and sinus pain.  Report for recommendations  were provided and discussed with the patient to include increasing fluids, allowing for plenty of rest, use of Tylenol for pain or discomfort, normal saline nasal spray throughout the day for nasal congestion, and use of a humidifier in her bedroom at nighttime during sleep.  Patient was given strict follow-up precautions.  Patient is in agreement with this plan of care and verbalizes understanding.  All questions were answered.  Patient stable for discharge.   Final Clinical Impressions(s) / UC Diagnoses   Final diagnoses:  Viral upper respiratory tract infection with cough  Encounter for screening for COVID-19  Sore throat     Discharge Instructions      The rapid strep test was negative.  A throat culture and COVID test are pending.  You will be contacted if the pending test results are positive.  If you have access to MyChart, you will also be able to see the results there. Take medication as prescribed. May take over-the-counter Tylenol as needed for pain, discomfort. Warm salt water gargles 3-4 times daily while throat pain persist. Recommend a soft diet to include soup, broth, yogurt, Jell-O, or applesauce while throat pain is present.  You can also try warm or cool liquids for throat pain or discomfort. Recommend using a humidifier in your bedroom at nighttime during sleep or sleeping elevated on pillows while cough symptoms persist. If symptoms do not improve over the next 5 to 7 days, or suddenly worsen before that time, please follow-up in this clinic with your primary care physician for further evaluation. Follow-up as needed.     ED Prescriptions     Medication Sig Dispense Auth. Provider   promethazine-dextromethorphan (PROMETHAZINE-DM) 6.25-15 MG/5ML syrup Take 5 mLs by mouth 4 (four) times daily as needed for cough. 118 mL Ismelda Weatherman-Warren, Sadie Haber, NP   fluticasone (FLONASE) 50 MCG/ACT nasal spray Place 2 sprays into both nostrils daily. 16 g Zykeem Bauserman-Warren, Sadie Haber,  NP   lidocaine (XYLOCAINE) 2 % solution Use as directed 5 mLs in the mouth or throat every 6 (six) hours as needed for mouth pain. Gargle and spit 5 mL every 6 hours as needed for throat pain or discomfort. 100 mL Konnor Vondrasek-Warren, Sadie Haber, NP      PDMP not reviewed this encounter.   Abran Cantor, NP 10/24/22 [phone]

## 2022-10-25 LAB — SARS CORONAVIRUS 2 (TAT 6-24 HRS): SARS Coronavirus 2: NEGATIVE

## 2022-10-27 LAB — CULTURE, GROUP A STREP (THRC)

## 2022-10-28 ENCOUNTER — Telehealth: Payer: Self-pay

## 2022-10-28 NOTE — Telephone Encounter (Signed)
Patient called to report symptoms of chills, headache and R ear pain. Symptoms started on or around 5/15. Patient went to urgent care, however, prescriptions are not relieving symptoms.  Patient would like assistance. [facility] schedule is currently booked. First available is next Tuesday. Patient refused.

## 2022-10-30 ENCOUNTER — Encounter: Payer: Self-pay | Admitting: Emergency Medicine

## 2022-10-30 ENCOUNTER — Other Ambulatory Visit: Payer: Self-pay

## 2022-10-30 ENCOUNTER — Ambulatory Visit
Admission: EM | Admit: 2022-10-30 | Discharge: 2022-10-30 | Disposition: A | Discharge status: Home / Self Care | Payer: Commercial Managed Care - PPO | Attending: Nurse Practitioner | Admitting: Nurse Practitioner

## 2022-10-30 DIAGNOSIS — J069 Acute upper respiratory infection, unspecified: Secondary | ICD-10-CM | POA: Diagnosis not present

## 2022-10-30 DIAGNOSIS — H6591 Unspecified nonsuppurative otitis media, right ear: Secondary | ICD-10-CM

## 2022-10-30 MED ORDER — AZITHROMYCIN 250 MG PO TABS: 250.0000 mg | ORAL_TABLET | Freq: Every day | ORAL | 0 refills | Status: DC

## 2022-10-30 MED ORDER — PSEUDOEPH-BROMPHEN-DM 30-2-10 MG/5ML PO SYRP: 5.0000 mL | ORAL_SOLUTION | Freq: Four times a day (QID) | ORAL | 0 refills | Status: DC | PRN

## 2022-10-30 MED ORDER — PREDNISONE 20 MG PO TABS: 40.0000 mg | ORAL_TABLET | Freq: Every day | ORAL | 0 refills | Status: AC

## 2022-10-30 NOTE — ED Provider Notes (Signed)
[facility]    CSN: [id number] Arrival date & time: 10/30/22  0807      History   Chief Complaint Chief Complaint  Patient presents with   Sore Throat    HPI Shannon Roth is a 57 y.o. female.   The history is provided by the patient.   Patient presents for follow-up for continued sore throat and cough.  Patient was seen in this clinic on 10/24/2022.  Patient's COVID test, strep test, and throat culture were negative.  She was prescribed viscous lidocaine for throat pain, Promethazine DM for her cough, and fluticasone for nasal congestion.  Patient also endorses now that she has pain in the right ear.  Patient denies any fever, chills, headache, ear drainage, wheezing, shortness of breath, or difficulty breathing.  Patient states that she feels like her symptoms have worsened.  Past Medical History:  Diagnosis Date   Arthritis    Cough    Hypertension    Migraine    Migraine 06/08/2000   Obesity    Rectal bleeding     Patient Active Problem List   Diagnosis Date Noted   Muscle cramps 07/26/2022   Lab test positive for detection of COVID-19 virus 05/17/2022   Encounter for annual physical exam 02/22/2022   Family history of coronary artery disease 11/12/2021   Lack of adequate sleep 10/14/2021   Fatigue 10/14/2021   Chronic pain in left foot 05/21/2021   Allergic sinusitis 01/12/2021   Skin lesion of breast 10/25/2020   Vitamin D deficiency 01/12/2020   Presbycusis of both ears 01/08/2020   Essential hypertension 12/19/2019   Foot pain, bilateral 12/19/2019   IBS (irritable bowel syndrome) 09/06/2019   Fatty liver 05/05/2017   Elevated ferritin 05/05/2017   Headache 09/03/2016   Knee pain, right 09/03/2016   Abnormal CT of liver 12/24/2015   Microcytic anemia 12/24/2015   Liver lesion, right lobe 12/19/2015   H/O asbestos exposure 12/03/2015   GERD (gastroesophageal reflux disease) 12/03/2015   Shortness of breath 12/03/2015   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 next TCS 10 years   ESOPHAGOGASTRODUODENOSCOPY (EGD) WITH PROPOFOL N/A 05/17/2018   Procedure: ESOPHAGOGASTRODUODENOSCOPY (EGD) WITH PROPOFOL;  Surgeon: West Bali, MD;  Location: [facility];  Service: Endoscopy;  Laterality: N/A;  1:45pm   EYE SURGERY     right cataract removal    GIVENS CAPSULE STUDY N/A 05/17/2018   Procedure: GIVENS CAPSULE STUDY;  Surgeon: West Bali, MD;  Location: [facility];  Service: Endoscopy;  Laterality: N/A;   TOTAL ABDOMINAL HYSTERECTOMY W/ BILATERAL SALPINGOOPHORECTOMY  2003   TUBAL LIGATION      OB History   No obstetric history on file.      Home Medications    Prior to Admission medications   Medication Sig Start Date End Date Taking? Authorizing Provider  ergocalciferol (VITAMIN D2) 1.25 MG (50000 UT) capsule Take 1 capsule (50,000 Units total) by mouth once a week. One capsule once weekly 05/15/22   Kerri Perches, MD  fluticasone [facility]) 50 MCG/ACT nasal spray Place 2 sprays into both nostrils daily. 10/24/22   Jasin Brazel-Warren, Sadie Haber, NP  lidocaine (XYLOCAINE) 2 % solution Use as directed 5 mLs in the mouth or throat every 6 (six) hours as needed for mouth pain. Gargle and spit 5 mL every 6 hours as needed for throat pain or discomfort. 10/24/22   Merion Grimaldo-Warren, Sadie Haber, NP  naproxen (NAPROSYN) 500 MG tablet Take 1 tablet  (500 mg total) by mouth 2 (two) times daily with a meal. As needed for pain 09/16/22   Kerri Perches, MD  olmesartan (BENICAR) 20 MG tablet Take 1 tablet (20 mg total) by mouth daily. 09/25/22   Kerri Perches, MD  omeprazole (PRILOSEC) 20 MG capsule TAKE 1 CAPSULE(20 MG) BY MOUTH DAILY 10/22/22   Kerri Perches, MD  promethazine-dextromethorphan (PROMETHAZINE-DM) 6.25-15 MG/5ML syrup Take 5 mLs by mouth 4 (four) times daily as needed for cough. 10/24/22   Chalee Hirota-Warren, Sadie Haber, NP  spironolactone (ALDACTONE) 100 MG tablet TAKE 1 TABLET(100 MG) BY MOUTH DAILY 08/18/22   Kerri Perches, MD    Family History Family History  Problem Relation Age of Onset   Asthma Mother    Diabetes Mother    Arthritis Mother    Heart disease Mother    Hypertension Father    Emphysema Paternal Grandfather    Emphysema Maternal Grandmother    Lung cancer Maternal Grandmother    Breast cancer Sister    Stroke Sister    Diabetes Brother    Heart disease Brother    Colon cancer Paternal Uncle        32s   Liver disease Neg Hx     Social History Social History   Tobacco Use   Smoking status: Never    Passive exposure: Yes   Smokeless tobacco: Never   Tobacco comments:    Father & close friend  Vaping Use   Vaping Use: Never used  Substance Use Topics   Alcohol use: No    Alcohol/week: 0.0 standard drinks of alcohol   Drug use: No     Allergies   Penicillin g, Tramadol, Codeine, Gabapentin, and Penicillins   Review of Systems Review of Systems Per HPI  Physical Exam Triage Vital Signs ED Triage Vitals  Enc Vitals Group     BP 10/30/22 0819 126/76     Pulse Rate 10/30/22 0819 62     Resp 10/30/22 0819 20     Temp 10/30/22 0819 98.5 F (36.9 C)     Temp Source 10/30/22 0819 Oral     SpO2 10/30/22 0819 97 %     Weight --      Height --      Head Circumference --      Peak Flow --      Pain Score 10/30/22 0818 9     Pain Loc --      Pain Edu? --      Excl.  in GC? --    No data found.  Updated Vital Signs BP 126/76 (BP Location: Right Arm)   Pulse 62   Temp 98.5 F (36.9 C) (Oral)   Resp 20   SpO2 97%   Visual Acuity Right Eye Distance:   Left Eye Distance:   Bilateral Distance:  Right Eye Near:   Left Eye Near:    Bilateral Near:     Physical Exam Vitals and nursing note reviewed.  Constitutional:      General: She is not in acute distress.    Appearance: She is well-developed.  HENT:     Head: Normocephalic.     Right Ear: Ear canal normal. A middle ear effusion is present. Tympanic membrane is erythematous and bulging.     Left Ear: Tympanic membrane and ear canal normal.     Nose: Congestion present.     Right Turbinates: Enlarged and swollen.     Left Turbinates: Enlarged and swollen.     Right Sinus: No maxillary sinus tenderness or frontal sinus tenderness.     Left Sinus: No maxillary sinus tenderness or frontal sinus tenderness.     Mouth/Throat:     Lips: Pink.     Mouth: Mucous membranes are moist.     Pharynx: Oropharynx is clear. Uvula midline. Posterior oropharyngeal erythema present. No pharyngeal swelling.     Tonsils: No tonsillar exudate.     Comments: Cobblestoning present on posterior oropharynx Eyes:     Conjunctiva/sclera: Conjunctivae normal.     Pupils: Pupils are equal, round, and reactive to light.  Cardiovascular:     Rate and Rhythm: Normal rate and regular rhythm.     Heart sounds: Normal heart sounds.  Pulmonary:     Effort: Pulmonary effort is normal. No respiratory distress.     Breath sounds: Normal breath sounds. No stridor. No wheezing, rhonchi or rales.  Abdominal:     General: Bowel sounds are normal.     Palpations: Abdomen is soft.     Tenderness: There is no abdominal tenderness.  Musculoskeletal:     Cervical back: Normal range of motion.  Lymphadenopathy:     Cervical: No cervical adenopathy.  Skin:    General: Skin is warm and dry.  Neurological:     General: No  focal deficit present.     Mental Status: She is alert and oriented to person, place, and time.  Psychiatric:        Mood and Affect: Mood normal.        Behavior: Behavior normal.      UC Treatments / Results  Labs (all labs ordered are listed, but only abnormal results are displayed) Labs Reviewed - No data to display  EKG   Radiology No results found.  Procedures Procedures (including critical care time)  Medications Ordered in UC Medications - No data to display  Initial Impression / Assessment and Plan / UC Course  I have reviewed the triage vital signs and the nursing notes.  Pertinent labs & imaging results that were available during my care of the patient were reviewed by me and considered in my medical decision making (see chart for details).  Patient presents for follow-up for sore throat and right ear pain.  On exam, patient has bulging and erythema of the right tympanic membrane, consistent with right otitis media.  Patient continues to experience viral upper respiratory symptoms to include postnasal drainage, and cough.  Will treat patient with azithromycin 250 mg for her nasal congestion with the other upper respiratory symptoms if they are actually bacterial.  Patient was also prescribed Bromfed-DM for her cough and prednisone 40 mg for the cough.  Supportive care recommendations were provided and discussed with the patient to include continuing over-the-counter analgesics for pain or discomfort, warm salt water gargles, and sleeping elevated  on pillows while cough symptoms persist.  Patient was given indications of when follow-up may be necessary.  Patient verbalizes understanding and is in agreement with this plan of care.  All questions were answered.  Patient stable for discharge.  Final Clinical Impressions(s) / UC Diagnoses   Final diagnoses:  None   Discharge Instructions   None    ED Prescriptions   None    PDMP not reviewed this encounter.    Abran Cantor, NP 10/30/22 [phone]

## 2022-10-30 NOTE — ED Triage Notes (Signed)
Pt reports was seen for same on Saturday and reports no improvement in symptoms. Pt reports continued throat pain and right ear started hurting a few days ago.   Has tried nasal spray and cough syrup.

## 2022-10-30 NOTE — Discharge Instructions (Addendum)
Take medication as prescribed. May continue over-the-counter Tylenol or ibuprofen as needed for pain, fever, general discomfort. Warm salt water gargles 3-4 times daily while throat pain persist. Recommend a soft diet to include soup, broth, yogurt, pudding, Jell-O, popsicles, warm or cool liquids while throat pain persist. Recommend using a humidifier in your bedroom at nighttime for sleep and sleeping elevated on pillows while cough symptoms persist. As discussed, if you begin to feel well, but continue to have a lingering cough, recommend use of throat lozenges, cough drops, and increasing your fluid intake.  If you continue to experience the cough, but have new symptoms such as fever, chills, wheezing, or shortness of breath, recommend following up in this clinic or with your primary care physician for further evaluation. Follow-up as needed.

## 2022-11-02 NOTE — Telephone Encounter (Signed)
Spoke with patient on 5/23 - per provider, patient should return to Urgent Care for further eval of symptoms. Patient states she is uninsured and will not return to UC.

## 2022-11-09 ENCOUNTER — Encounter: Payer: Self-pay | Admitting: Internal Medicine

## 2022-11-09 ENCOUNTER — Ambulatory Visit (INDEPENDENT_AMBULATORY_CARE_PROVIDER_SITE_OTHER): Payer: Commercial Managed Care - PPO | Admitting: Internal Medicine

## 2022-11-09 VITALS — BP 132/73 | HR 67 | Ht 68.0 in | Wt 316.2 lb

## 2022-11-09 DIAGNOSIS — R0981 Nasal congestion: Secondary | ICD-10-CM | POA: Insufficient documentation

## 2022-11-09 DIAGNOSIS — H66001 Acute suppurative otitis media without spontaneous rupture of ear drum, right ear: Secondary | ICD-10-CM | POA: Diagnosis not present

## 2022-11-09 DIAGNOSIS — H6501 Acute serous otitis media, right ear: Secondary | ICD-10-CM | POA: Insufficient documentation

## 2022-11-09 DIAGNOSIS — K219 Gastro-esophageal reflux disease without esophagitis: Secondary | ICD-10-CM | POA: Diagnosis not present

## 2022-11-09 MED ORDER — PANTOPRAZOLE SODIUM 40 MG PO TBEC: 40.0000 mg | DELAYED_RELEASE_TABLET | Freq: Every day | ORAL | 3 refills | Status: DC

## 2022-11-09 MED ORDER — TRIAMCINOLONE ACETONIDE 40 MG/ML IJ SUSP
40.0000 mg | Freq: Once | INTRAMUSCULAR | Status: AC
  Administered 2022-11-09: 40 mg via INTRAMUSCULAR

## 2022-11-09 MED ORDER — OFLOXACIN 0.3 % OT SOLN: 5.0000 [drp] | Freq: Two times a day (BID) | OTIC | 0 refills | Status: AC

## 2022-11-09 NOTE — Progress Notes (Signed)
Acute Office Visit  Subjective:     Patient ID: Shannon Roth, female    DOB: [birth year], 57 y.o.   MRN: [id number]  Chief Complaint  Patient presents with   flu like symptoms    Had symptoms since 10/09/2022    Shannon Roth presents today for an acute visit endorsing a nearly 4-week history of flulike symptoms.  She has presented to urgent care multiple times.  Testing for COVID/influenza and strep throat have been negative.  She has been treated with azithromycin and steroids.  Despite these measures her symptoms persist.  Symptoms of greatest concern to her today or fullness in the right ear, nausea with vomiting, and reflux.  She has most recently been treated with azithromycin for acute otitis media of the right ear.  She has also been prescribed multiple cough medications without significant symptom improvement.  She is interested in additional treatment options today as she is currently missing class and symptoms are overall getting worse.  Review of Systems  Constitutional:  Positive for malaise/fatigue.  HENT:  Positive for congestion, ear pain, hearing loss, sinus pain and sore throat.   Respiratory:  Positive for cough. Negative for sputum production and shortness of breath.   Cardiovascular:  Negative for chest pain.  Gastrointestinal:  Positive for heartburn, nausea and vomiting. Negative for abdominal pain.  Genitourinary:  Negative for dysuria.      Objective:    BP 132/73   Pulse 67   Ht 5\' 8"  (1.727 m)   Wt (!) 316 lb 3.2 oz (143.4 kg)   SpO2 93%   BMI 48.08 kg/m   Physical Exam Vitals reviewed.  Constitutional:      General: She is not in acute distress.    Appearance: Normal appearance. She is obese. She is not toxic-appearing.  HENT:     Head: Normocephalic and atraumatic.     Right Ear: External ear normal. Decreased hearing noted. Tympanic membrane is bulging.     Left Ear: Hearing and external ear normal.     Nose: Congestion and rhinorrhea present.      Mouth/Throat:     Mouth: Mucous membranes are moist.     Pharynx: Oropharynx is clear. No oropharyngeal exudate or posterior oropharyngeal erythema.  Eyes:     General: No scleral icterus.    Extraocular Movements: Extraocular movements intact.     Conjunctiva/sclera: Conjunctivae normal.     Pupils: Pupils are equal, round, and reactive to light.  Cardiovascular:     Rate and Rhythm: Normal rate and regular rhythm.     Pulses: Normal pulses.     Heart sounds: Normal heart sounds. No murmur heard.    No friction rub. No gallop.  Pulmonary:     Effort: Pulmonary effort is normal.     Breath sounds: Normal breath sounds. No wheezing, rhonchi or rales.  Abdominal:     General: Abdomen is flat. Bowel sounds are normal. There is no distension.     Palpations: Abdomen is soft.     Tenderness: There is no abdominal tenderness.  Musculoskeletal:        General: No swelling. Normal range of motion.     Cervical back: Normal range of motion.     Right lower leg: No edema.     Left lower leg: No edema.  Lymphadenopathy:     Cervical: No cervical adenopathy.  Skin:    General: Skin is warm and dry.     Capillary Refill: Capillary refill  takes less than 2 seconds.     Coloration: Skin is not jaundiced.  Neurological:     General: No focal deficit present.     Mental Status: She is alert and oriented to person, place, and time.  Psychiatric:        Mood and Affect: Mood normal.        Behavior: Behavior normal.       Assessment & Plan:   Problem List Items Addressed This Visit       Nasal sinus congestion    She continues to experience significant sinus congestion and nonproductive cough.   -Recommend continued supportive care measures.  She has multiple cough medications at home. -Recommend use of fluticasone nasal spray and Sudafed for symptom relief as well -Will also administer triamcinolone 40 mg IM injection for symptom relief -She was instructed return to care if her  symptoms worsen or fail to improve      GERD (gastroesophageal reflux disease)    Symptoms are poorly controlled currently.  She endorses recent nausea with vomiting.  Currently prescribed omeprazole, which she reports is ineffective in alleviating her symptoms. -Discontinue omeprazole.  Start Protonix 40 mg daily.  Can increase to twice daily if needed for symptom relief.      Acute serous otitis media, right ear    Her chief concern today is persistent right ear fullness and loss of hearing.  Recently treated with azithromycin for acute otitis media of the right ear.  She continues to experience discomfort in the right ear.  Bulging TM present on exam today. -Ofloxacin eardrops x 10 days prescribed today for treatment of OM of the right ear -Recommend use of fluticasone nasal spray and Sudafed as I suspect her symptoms are also partially attributable to eustachian tube dysfunction in the setting of persistent sinus congestion.      Meds ordered this encounter  Medications   ofloxacin (FLOXIN) 0.3 % OTIC solution    Sig: Place 5 drops into the right ear 2 (two) times daily for 10 days.    Dispense:  5 mL    Refill:  0   pantoprazole (PROTONIX) 40 MG tablet    Sig: Take 1 tablet (40 mg total) by mouth daily.    Dispense:  30 tablet    Refill:  3   triamcinolone acetonide (KENALOG-40) injection 40 mg    Return if symptoms worsen or fail to improve.  Billie Lade, MD

## 2022-11-09 NOTE — Assessment & Plan Note (Signed)
Her chief concern today is persistent right ear fullness and loss of hearing.  Recently treated with azithromycin for acute otitis media of the right ear.  She continues to experience discomfort in the right ear.  Bulging TM present on exam today. -Ofloxacin eardrops x 10 days prescribed today for treatment of OM of the right ear -Recommend use of fluticasone nasal spray and Sudafed as I suspect her symptoms are also partially attributable to eustachian tube dysfunction in the setting of persistent sinus congestion.

## 2022-11-09 NOTE — Patient Instructions (Signed)
It was a pleasure to see you today.  Thank you for giving [place] the opportunity to be involved in your care.  Below is a brief recap of your visit and next steps.  We will plan to see you again in July.  Summary Ofloxacin ear drops prescribed today Recommend using Flonase and Sudafed for relief of sinus / ear congestion Triamcinolone injection administered today Return to care if your symptoms worsen or fail to improve. Otherwise we will see you for routine follow up with Dr. Lodema Hong in July.

## 2022-11-09 NOTE — Assessment & Plan Note (Signed)
Symptoms are poorly controlled currently.  She endorses recent nausea with vomiting.  Currently prescribed omeprazole, which she reports is ineffective in alleviating her symptoms. -Discontinue omeprazole.  Start Protonix 40 mg daily.  Can increase to twice daily if needed for symptom relief.

## 2022-11-09 NOTE — Assessment & Plan Note (Signed)
She continues to experience significant sinus congestion and nonproductive cough.   -Recommend continued supportive care measures.  She has multiple cough medications at home. -Recommend use of fluticasone nasal spray and Sudafed for symptom relief as well -Will also administer triamcinolone 40 mg IM injection for symptom relief -She was instructed return to care if her symptoms worsen or fail to improve

## 2022-11-19 ENCOUNTER — Other Ambulatory Visit: Payer: Self-pay | Admitting: Family Medicine

## 2022-11-19 ENCOUNTER — Telehealth: Payer: Self-pay | Admitting: Family Medicine

## 2022-11-19 MED ORDER — IBUPROFEN 800 MG PO TABS: 800.0000 mg | ORAL_TABLET | Freq: Three times a day (TID) | ORAL | 0 refills | Status: DC | PRN

## 2022-11-19 NOTE — Telephone Encounter (Signed)
Prescription Request  11/19/2022  LOV: 09/25/2022  What is the name of the medication or equipment? ibuprofen (ADVIL,MOTRIN) 800 MG tablet   Have you contacted your pharmacy to request a refill? Yes   Which pharmacy would you like this sent to?  [facility] [phone] - [place], [place] - [address] AT [facility] [address] [place] [address] Phone: [phone] Fax: [phone]    Patient notified that their request is being sent to the clinical staff for review and that they should receive a response within 2 business days.   Please advise at [facility] [phone]   Chronic Migraines

## 2022-12-03 ENCOUNTER — Other Ambulatory Visit: Payer: Self-pay | Admitting: Podiatry

## 2022-12-03 ENCOUNTER — Encounter: Payer: Self-pay | Admitting: Podiatry

## 2022-12-03 ENCOUNTER — Ambulatory Visit (INDEPENDENT_AMBULATORY_CARE_PROVIDER_SITE_OTHER): Payer: Commercial Managed Care - PPO

## 2022-12-03 ENCOUNTER — Ambulatory Visit (INDEPENDENT_AMBULATORY_CARE_PROVIDER_SITE_OTHER): Payer: Commercial Managed Care - PPO | Admitting: Podiatry

## 2022-12-03 DIAGNOSIS — M722 Plantar fascial fibromatosis: Secondary | ICD-10-CM

## 2022-12-03 DIAGNOSIS — R52 Pain, unspecified: Secondary | ICD-10-CM | POA: Diagnosis not present

## 2022-12-03 MED ORDER — TRIAMCINOLONE ACETONIDE 10 MG/ML IJ SUSP
10.0000 mg | Freq: Once | INTRAMUSCULAR | Status: AC
  Administered 2022-12-03: 10 mg

## 2022-12-03 MED ORDER — DICLOFENAC SODIUM 75 MG PO TBEC: 75.0000 mg | DELAYED_RELEASE_TABLET | Freq: Two times a day (BID) | ORAL | 2 refills | Status: DC

## 2022-12-03 NOTE — Progress Notes (Signed)
Subjective:   Patient ID: Shannon Roth, female   DOB: 57 y.o.   MRN: [id number]   HPI Patient has developed pain mostly in the plantar heel left with inflammation fluid buildup with obesity is complicating factor   ROS      Objective:  Physical Exam  Neurovascular status intact exquisite discomfort in the mid fascia left near insertion calcaneus and into the medial band also with moderate depression of the arch     Assessment:  Acute Planter fasciitis left with inflammation flattening of the arch     Plan:  H&P reviewed went ahead today did sterile prep injected the plantar fascia at insertion 3 mg Kenalog 5 mg Xylocaine applied fascial brace instructions on usage advised on ice therapy and support shoes and physical therapy and reappoint to recheck  X-rays indicate small spur no indication stress fracture arthritis

## 2022-12-23 ENCOUNTER — Encounter: Payer: Self-pay | Admitting: Family Medicine

## 2022-12-23 ENCOUNTER — Ambulatory Visit (INDEPENDENT_AMBULATORY_CARE_PROVIDER_SITE_OTHER): Payer: Commercial Managed Care - PPO | Admitting: Family Medicine

## 2022-12-23 VITALS — BP 138/82 | Ht 68.0 in | Wt 318.1 lb

## 2022-12-23 DIAGNOSIS — E559 Vitamin D deficiency, unspecified: Secondary | ICD-10-CM | POA: Diagnosis not present

## 2022-12-23 DIAGNOSIS — D649 Anemia, unspecified: Secondary | ICD-10-CM

## 2022-12-23 DIAGNOSIS — E785 Hyperlipidemia, unspecified: Secondary | ICD-10-CM | POA: Diagnosis not present

## 2022-12-23 DIAGNOSIS — J309 Allergic rhinitis, unspecified: Secondary | ICD-10-CM

## 2022-12-23 DIAGNOSIS — I10 Essential (primary) hypertension: Secondary | ICD-10-CM

## 2022-12-23 NOTE — Assessment & Plan Note (Addendum)
Hyperlipidemia:Low fat diet discussed and encouraged.   Lipid Panel  Lab Results  Component Value Date   CHOL 188 05/13/2022   HDL 52 05/13/2022   LDLCALC 116 (H) 05/13/2022   TRIG 112 05/13/2022   CHOLHDL 3.6 05/13/2022     Needs to reduce fried and fatty foods.

## 2022-12-23 NOTE — Patient Instructions (Signed)
Annual exam end September, or early October, call if you need me sooner  PLEASE schedule mammogram and [facility] of [facility]illa [facility]  You need to  go on the web site to start the application process for Bariatric surgery, I do believe this is the best decision for you\\Labs today, cBC, lipid, cmp and eGFR , hBa1C ,  TSH and vit D   Continue current BP meds  Thanks for choosing [facility], we consider it a privelige to serve you.

## 2022-12-23 NOTE — Assessment & Plan Note (Signed)
Controlled, no change in medication DASH diet and commitment to daily physical activity for a minimum of 30 minutes discussed and encouraged, as a part of hypertension management. The importance of attaining a healthy weight is also discussed.     12/23/2022    9:37 AM 12/23/2022    9:01 AM 11/09/2022    3:39 PM 10/30/2022    8:19 AM 10/24/2022    8:25 AM 09/25/2022    9:46 AM 07/23/2022    2:09 PM  BP/Weight  Systolic BP 138 128 132 126 140 148 135  Diastolic BP 82 68 73 76 79 88 68  Wt. (Lbs)  318.08 316.2   318 305.12  BMI  48.36 kg/m2 48.08 kg/m2   49.81 kg/m2 46.39 kg/m2

## 2022-12-23 NOTE — Assessment & Plan Note (Addendum)
Very low needs to commit to supplemental vit D weekly

## 2022-12-23 NOTE — Assessment & Plan Note (Signed)
  Patient re-educated about  the importance of commitment to a  minimum of 150 minutes of exercise per week as able.  The importance of healthy food choices with portion control discussed, as well as eating regularly and within a 12 hour window most days. The need to choose "clean , green" food 50 to 75% of the time is discussed, as well as to make water the primary drink and set a goal of 64 ounces water daily.       12/23/2022    9:01 AM 11/09/2022    3:39 PM 09/25/2022    9:46 AM  Weight /BMI  Weight 318 lb 1.3 oz 316 lb 3.2 oz 318 lb  Height 5\' 8"  (1.727 m) 5\' 8"  (1.727 m) 5\' 7"  (1.702 m)  BMI 48.36 kg/m2 48.08 kg/m2 49.81 kg/m2    Recommend surgery pt to start the oprocess

## 2022-12-24 ENCOUNTER — Other Ambulatory Visit: Payer: Self-pay

## 2022-12-24 DIAGNOSIS — D649 Anemia, unspecified: Secondary | ICD-10-CM

## 2022-12-24 LAB — LIPID PANEL
Chol/HDL Ratio: 3.4 ratio (ref 0.0–4.4)
Cholesterol, Total: 190 mg/dL (ref 100–199)
HDL: 56 mg/dL (ref >39)
LDL Chol Calc (NIH): 122 mg/dL — ABNORMAL HIGH (ref 0–99)
Triglycerides: 67 mg/dL (ref 0–149)
VLDL Cholesterol Cal: 12 mg/dL (ref 5–40)

## 2022-12-24 LAB — CBC
Hematocrit: 37.1 % (ref 34.0–46.6)
Hemoglobin: 10.7 g/dL — ABNORMAL LOW (ref 11.1–15.9)
MCH: 22.5 pg — ABNORMAL LOW (ref 26.6–33.0)
MCHC: 28.8 g/dL — ABNORMAL LOW (ref 31.5–35.7)
MCV: 78 fL — ABNORMAL LOW (ref 79–97)
Platelets: 346 10*3/uL (ref 150–450)
RBC: 4.76 x10E6/uL (ref 3.77–5.28)
RDW: 14.5 % (ref 11.7–15.4)
WBC: 8.2 10*3/uL (ref 3.4–10.8)

## 2022-12-24 LAB — CMP14+EGFR
ALT: 14 IU/L (ref 0–32)
AST: 15 IU/L (ref 0–40)
Albumin: 4.2 g/dL (ref 3.8–4.9)
Alkaline Phosphatase: 100 IU/L (ref 44–121)
BUN/Creatinine Ratio: 21 (ref 9–23)
BUN: 18 mg/dL (ref 6–24)
Bilirubin Total: 0.3 mg/dL (ref 0.0–1.2)
CO2: 25 mmol/L (ref 20–29)
Calcium: 9.7 mg/dL (ref 8.7–10.2)
Chloride: 102 mmol/L (ref 96–106)
Creatinine, Ser: 0.86 mg/dL (ref 0.57–1.00)
Globulin, Total: 3.1 g/dL (ref 1.5–4.5)
Glucose: 81 mg/dL (ref 70–99)
Potassium: 4.7 mmol/L (ref 3.5–5.2)
Sodium: 140 mmol/L (ref 134–144)
Total Protein: 7.3 g/dL (ref 6.0–8.5)
eGFR: 79 mL/min/{1.73_m2} (ref >59)

## 2022-12-24 LAB — HEMOGLOBIN A1C
Est. average glucose Bld gHb Est-mCnc: 105 mg/dL
Hgb A1c MFr Bld: 5.3 % (ref 4.8–5.6)

## 2022-12-24 LAB — VITAMIN D 25 HYDROXY (VIT D DEFICIENCY, FRACTURES): Vit D, 25-Hydroxy: 18.7 ng/mL — ABNORMAL LOW (ref 30.0–100.0)

## 2022-12-24 LAB — TSH: TSH: 1.65 u[IU]/mL (ref 0.450–4.500)

## 2022-12-24 MED ORDER — VITAMIN D (ERGOCALCIFEROL) 1.25 MG (50000 UNIT) PO CAPS: 50000.0000 [IU] | ORAL_CAPSULE | ORAL | 8 refills | Status: DC

## 2022-12-25 LAB — VITAMIN B12

## 2022-12-25 LAB — FOLATE: Folate: 3.2 ng/mL (ref >3.0)

## 2022-12-25 LAB — IRON: Iron: 75 ug/dL (ref 27–159)

## 2022-12-26 LAB — SPECIMEN STATUS REPORT

## 2022-12-29 ENCOUNTER — Encounter: Payer: Self-pay | Admitting: Family Medicine

## 2022-12-29 NOTE — Assessment & Plan Note (Signed)
Controlled , no change in management 

## 2022-12-29 NOTE — Progress Notes (Signed)
Shannon Roth     MRN: [id number]      DOB: 06-Dec-1965  Chief Complaint  Patient presents with   Follow-up    Bp follow up    HPI Shannon Roth is here for follow up and re-evaluation of chronic medical conditions, medication management and review of any available recent lab and radiology data.  Preventive health is updated, specifically  Cancer screening and Immunization.   Questions or concerns regarding consultations or procedures which the PT has had in the interim are  addressed. The PT denies any adverse reactions to current medications since the last visit.  No progress with weight loss, interested in bariatric surgery ROS Denies recent fever or chills. Denies sinus pressure, nasal congestion, ear pain or sore throat. Denies chest congestion, productive cough or wheezing. Denies chest pains, palpitations and leg swelling Denies abdominal pain, nausea, vomiting,diarrhea or constipation.   Denies dysuria, frequency, hesitancy or incontinence. Denies  uncontrolled joint pain, swelling and limitation in mobility. Denies headaches, seizures, numbness, or tingling. Denies depression, anxiety or insomnia. Denies skin break down or rash.   PE  BP 138/82   Ht 5\' 8"  (1.727 m)   Wt (!) 318 lb 1.3 oz (144.3 kg)   BMI 48.36 kg/m   Patient alert and oriented and in no cardiopulmonary distress.  HEENT: No facial asymmetry, EOMI,     Neck supple .  Chest: Clear to auscultation bilaterally.  CVS: S1, S2 no murmurs, no S3.Regular rate.  ABD: Soft non tender.   Ext: No edema  MS: Adequate though reduced  ROM spine, shoulders, hips and knees.  Skin: Intact, no ulcerations or rash noted.  Psych: Good eye contact, normal affect. Memory intact not anxious or depressed appearing.  CNS: CN 2-12 intact, power,  normal throughout.no focal deficits noted.   Assessment & Plan  Essential hypertension Controlled, no change in medication DASH diet and commitment to daily physical  activity for a minimum of 30 minutes discussed and encouraged, as a part of hypertension management. The importance of attaining a healthy weight is also discussed.     12/23/2022    9:37 AM 12/23/2022    9:01 AM 11/09/2022    3:39 PM 10/30/2022    8:19 AM 10/24/2022    8:25 AM 09/25/2022    9:46 AM 07/23/2022    2:09 PM  BP/Weight  Systolic BP 138 128 132 126 140 148 135  Diastolic BP 82 68 73 76 79 88 68  Wt. (Lbs)  318.08 316.2   318 305.12  BMI  48.36 kg/m2 48.08 kg/m2   49.81 kg/m2 46.39 kg/m2       Morbid obesity  Patient re-educated about  the importance of commitment to a  minimum of 150 minutes of exercise per week as able.  The importance of healthy food choices with portion control discussed, as well as eating regularly and within a 12 hour window most days. The need to choose "clean , green" food 50 to 75% of the time is discussed, as well as to make water the primary drink and set a goal of 64 ounces water daily.       12/23/2022    9:01 AM 11/09/2022    3:39 PM 09/25/2022    9:46 AM  Weight /BMI  Weight 318 lb 1.3 oz 316 lb 3.2 oz 318 lb  Height 5\' 8"  (1.727 m) 5\' 8"  (1.727 m) 5\' 7"  (1.702 m)  BMI 48.36 kg/m2 48.08 kg/m2 49.81 kg/m2  Recommend surgery pt to start the oprocess  Vitamin D deficiency Very low needs to commit to supplemental vit D weekly   Dyslipidemia Hyperlipidemia:Low fat diet discussed and encouraged.   Lipid Panel  Lab Results  Component Value Date   CHOL 188 05/13/2022   HDL 52 05/13/2022   LDLCALC 116 (H) 05/13/2022   TRIG 112 05/13/2022   CHOLHDL 3.6 05/13/2022     Needs to reduce fried and fatty foods.   Allergic sinusitis Controlled, no change in management

## 2022-12-30 ENCOUNTER — Telehealth: Payer: Self-pay | Admitting: Family Medicine

## 2022-12-30 ENCOUNTER — Other Ambulatory Visit: Payer: Self-pay

## 2022-12-30 NOTE — Telephone Encounter (Signed)
I spoke with pt and she understands.

## 2022-12-30 NOTE — Telephone Encounter (Signed)
Pt returning call to Katie 

## 2022-12-30 NOTE — Telephone Encounter (Signed)
Patient called she is confused as to why she needs to see gastro if her iron levels were normal ? Please advise ?

## 2023-01-05 ENCOUNTER — Encounter: Payer: Self-pay | Admitting: Gastroenterology

## 2023-01-05 ENCOUNTER — Ambulatory Visit: Payer: Commercial Managed Care - PPO | Admitting: Gastroenterology

## 2023-01-05 VITALS — BP 132/71 | HR 58 | Temp 98.6°F | Ht 68.0 in | Wt 322.2 lb

## 2023-01-05 DIAGNOSIS — D509 Iron deficiency anemia, unspecified: Secondary | ICD-10-CM

## 2023-01-05 DIAGNOSIS — K649 Unspecified hemorrhoids: Secondary | ICD-10-CM | POA: Diagnosis not present

## 2023-01-05 DIAGNOSIS — K219 Gastro-esophageal reflux disease without esophagitis: Secondary | ICD-10-CM | POA: Diagnosis not present

## 2023-01-05 NOTE — Progress Notes (Unsigned)
GI Office Note    Referring Provider: Kerri Perches, MD Primary Care Physician:  Kerri Perches, MD  Primary Gastroenterologist: formerly Dr. Darrick Penna  Chief Complaint   Chief Complaint  Patient presents with   Anemia    History of Present Illness   Shannon Roth is a 57 y.o. female presenting today at the request of Dr. Lodema Hong for new onset anemia.    Naproxyn stopped working Now ibuprofen takes prn. Diclofen few weeks ago. Alternate bM regular. No melena, brbpr. No blood donation.   Cologuard 102/2023 negative.       Labs December 23, 2022 showed hemoglobin 10.7 down from 11.5 seven months prior.  MCV 78, platelets 346,000, folate 3.2 low normal, iron 75, B12 569  EGD December 2019: -mild gastritis/duodenitis s/p biopsy. Consistent with gastritis/duodenitis, no H. pylori  Colonoscopy December 2017: -2 rectal polyps removed, hyperplastic -Left colon redundant -Small internal hemorrhoids -Colonoscopy 10 years  Medications   Current Outpatient Medications  Medication Sig Dispense Refill   diclofenac (VOLTAREN) 75 MG EC tablet Take 1 tablet (75 mg total) by mouth 2 (two) times daily. 50 tablet 2   fluticasone (FLONASE) 50 MCG/ACT nasal spray Place 2 sprays into both nostrils daily. 16 g 0   ibuprofen (ADVIL) 800 MG tablet Take 1 tablet (800 mg total) by mouth every 8 (eight) hours as needed. 30 tablet 0   olmesartan (BENICAR) 20 MG tablet Take 1 tablet (20 mg total) by mouth daily. 30 tablet 3   pantoprazole (PROTONIX) 40 MG tablet Take 1 tablet (40 mg total) by mouth daily. 30 tablet 3   spironolactone (ALDACTONE) 100 MG tablet TAKE 1 TABLET(100 MG) BY MOUTH DAILY 90 tablet 3   Vitamin D, Ergocalciferol, (DRISDOL) 1.25 MG (50000 UNIT) CAPS capsule Take 1 capsule (50,000 Units total) by mouth every 7 (seven) days. 5 capsule 8   No current facility-administered medications for this visit.    Allergies   Allergies as of 01/05/2023 - Review  Complete 01/05/2023  Allergen Reaction Noted   Penicillin g Other (See Comments) 11/24/2016   Tramadol  08/09/2019   Codeine Nausea And Vomiting 05/10/2008   Gabapentin Rash 08/09/2019   Penicillins Rash 09/12/2007    Past Medical History   Past Medical History:  Diagnosis Date   Arthritis    Cough    Hypertension    Migraine    Migraine 06/08/2000   Obesity    Rectal bleeding     Past Surgical History   Past Surgical History:  Procedure Laterality Date   bilateral BIOPSY BREAST  2002   benign   Bilateral tubal ligation  2001   BIOPSY  05/17/2018   Procedure: BIOPSY;  Surgeon: West Bali, MD;  Location: [facility];  Service: Endoscopy;;  duodenum and gastric   CHOLECYSTECTOMY  2003   COLONOSCOPY N/A 06/21/2015   SLF: hyperplastic polyps removed. next TCS 10 years   ESOPHAGOGASTRODUODENOSCOPY (EGD) WITH PROPOFOL N/A 05/17/2018   Procedure: ESOPHAGOGASTRODUODENOSCOPY (EGD) WITH PROPOFOL;  Surgeon: West Bali, MD;  Location: [facility];  Service: Endoscopy;  Laterality: N/A;  1:45pm   EYE SURGERY     right cataract removal    GIVENS CAPSULE STUDY N/A 05/17/2018   Procedure: GIVENS CAPSULE STUDY;  Surgeon: West Bali, MD;  Location: [facility];  Service: Endoscopy;  Laterality: N/A;   TOTAL ABDOMINAL HYSTERECTOMY W/ BILATERAL SALPINGOOPHORECTOMY  2003   TUBAL LIGATION      Past Family  History   Family History  Problem Relation Age of Onset   Asthma Mother    Diabetes Mother    Arthritis Mother    Heart disease Mother    Hypertension Father    Emphysema Paternal Grandfather    Emphysema Maternal Grandmother    Lung cancer Maternal Grandmother    Breast cancer Sister    Stroke Sister    Diabetes Brother    Heart disease Brother    Colon cancer Paternal Uncle        38s   Liver disease Neg Hx     Past Social History   Social History   Socioeconomic History   Marital status: Single    Spouse name: Not on file   Number of children:  1   Years of education: Not on file   Highest education level: Not on file  Occupational History   Occupation: Social Services  Tobacco Use   Smoking status: Never    Passive exposure: Yes   Smokeless tobacco: Never   Tobacco comments:    Father & close friend  Advertising account planner   Vaping status: Never Used  Substance and Sexual Activity   Alcohol use: No    Alcohol/week: 0.0 standard drinks of alcohol   Drug use: No   Sexual activity: Not on file  Other Topics Concern   Not on file  Social History Narrative   Originally from [place]. 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 She reports that she wore a full body suit but questionable respiratory equipment for protection. No bird, mold, or hot tub exposure.    Social Determinants of Health   Financial Resource Strain: Not on file  Food Insecurity: Not on file  Transportation Needs: Not on file  Physical Activity: Not on file  Stress: Not on file  Social Connections: Unknown (10/20/2021)   Received from [facility]   Social Network    Social Network: Not on file  Intimate Partner Violence: Unknown (09/11/2021)   Received from [facility]   HITS    Physically Hurt: Not on file    Insult or Talk Down To: Not on file    Threaten Physical Harm: Not on file    Scream or Curse: Not on file    Review of Systems   General: Negative for anorexia, weight loss, fever, chills, fatigue, weakness. Eyes: Negative for vision changes.  ENT: Negative for hoarseness, difficulty swallowing , nasal congestion. CV: Negative for chest pain, angina, palpitations, dyspnea on exertion, peripheral edema.  Respiratory: Negative for dyspnea at rest, dyspnea on exertion, cough, sputum, wheezing.  GI: See history of present illness. GU:  Negative for dysuria, hematuria, urinary incontinence, urinary frequency, nocturnal urination.  MS: Negative for joint pain, low back pain.  Derm: Negative for rash or itching.  Neuro: Negative for  weakness, abnormal sensation, seizure, frequent headaches, memory loss,  confusion.  Psych: Negative for anxiety, depression, suicidal ideation, hallucinations.  Endo: Negative for unusual weight change.  Heme: Negative for bruising or bleeding. Allergy: Negative for rash or hives.  Physical Exam   BP 132/71 (BP Location: Right Arm, Patient Position: Sitting, Cuff Size: Large) Comment (Cuff Size): thigh cuff  Pulse (!) 58   Temp 98.6 F (37 C) (Oral)   Ht 5\' 8"  (1.727 m)   Wt (!) 322 lb 3.2 oz (146.1 kg)   SpO2 97%   BMI 48.99 kg/m    General: Well-nourished, well-developed in no acute distress.  Head: Normocephalic, atraumatic.  Eyes: Conjunctiva pink, no icterus. Mouth: Oropharyngeal mucosa moist and pink , no lesions erythema or exudate. Neck: Supple without thyromegaly, masses, or lymphadenopathy.  Lungs: Clear to auscultation bilaterally.  Heart: Regular rate and rhythm, no murmurs rubs or gallops.  Abdomen: Bowel sounds are normal, nontender, nondistended, no hepatosplenomegaly or masses,  no abdominal bruits or hernia, no rebound or guarding.   Rectal: *** Extremities: No lower extremity edema. No clubbing or deformities.  Neuro: Alert and oriented x 4 , grossly normal neurologically.  Skin: Warm and dry, no rash or jaundice.   Psych: Alert and cooperative, normal mood and affect.  Labs   Lab Results  Component Value Date   HGBA1C 5.3 12/23/2022   Lab Results  Component Value Date   NA 140 12/23/2022   CL 102 12/23/2022   K 4.7 12/23/2022   CO2 25 12/23/2022   BUN 18 12/23/2022   CREATININE 0.86 12/23/2022   EGFR 79 12/23/2022   CALCIUM 9.7 12/23/2022   ALBUMIN 4.2 12/23/2022   GLUCOSE 81 12/23/2022   Lab Results  Component Value Date   ALT 14 12/23/2022   AST 15 12/23/2022   ALKPHOS 100 12/23/2022   BILITOT 0.3 12/23/2022   Lab Results  Component Value Date   WBC 8.2 12/23/2022   HGB 10.7 (L) 12/23/2022   HCT 37.1 12/23/2022   MCV 78 (L)  12/23/2022   PLT 346 12/23/2022   Lab Results  Component Value Date   IRON 75 12/23/2022               Lab Results  Component Value Date   VITAMINB12 569 12/23/2022   Lab Results  Component Value Date   FOLATE 3.2 12/23/2022    Imaging Studies   No results found.  Assessment       PLAN   ***   Leanna Battles. Melvyn Neth, MHS, PA-C [facility]

## 2023-01-05 NOTE — Patient Instructions (Signed)
Please complete the stool test and return to our office. We will decide work up of anemia based on if there is blood in the stool or not.

## 2023-01-06 ENCOUNTER — Ambulatory Visit
Admission: RE | Admit: 2023-01-06 | Discharge: 2023-01-06 | Disposition: A | Discharge status: Home / Self Care | Payer: Commercial Managed Care - PPO | Source: Ambulatory Visit | Attending: Family Medicine | Admitting: Family Medicine

## 2023-01-06 DIAGNOSIS — Z1239 Encounter for other screening for malignant neoplasm of breast: Secondary | ICD-10-CM

## 2023-01-07 ENCOUNTER — Telehealth: Payer: Self-pay | Admitting: Gastroenterology

## 2023-01-07 MED ORDER — FOLIC ACID 1 MG PO TABS: 1.0000 mg | ORAL_TABLET | Freq: Every day | ORAL | 1 refills | Status: DC

## 2023-01-07 NOTE — Telephone Encounter (Signed)
Please let pt know, while we are waiting for ifobt to come in (on order), I would suggest she take folic acid 1mg  daily for 2 months. Her folic acid was low normal. I will send in RX.

## 2023-01-08 ENCOUNTER — Other Ambulatory Visit: Payer: Self-pay | Admitting: Gastroenterology

## 2023-01-08 NOTE — Telephone Encounter (Signed)
Pt aware of results and rx was sent in. Also Per Tammy C send hemmocult cards to patient to do since IFOBT kits on back order. Patient is also aware. I have mailed this to her

## 2023-01-13 NOTE — Telephone Encounter (Signed)
noted 

## 2023-01-21 ENCOUNTER — Other Ambulatory Visit: Payer: Self-pay | Admitting: Family Medicine

## 2023-03-04 DIAGNOSIS — D649 Anemia, unspecified: Secondary | ICD-10-CM | POA: Diagnosis not present

## 2023-03-17 ENCOUNTER — Ambulatory Visit (INDEPENDENT_AMBULATORY_CARE_PROVIDER_SITE_OTHER): Payer: Commercial Managed Care - PPO | Admitting: Family Medicine

## 2023-03-17 ENCOUNTER — Encounter: Payer: Self-pay | Admitting: Family Medicine

## 2023-03-17 VITALS — BP 110/64 | Ht 68.0 in | Wt 322.1 lb

## 2023-03-17 DIAGNOSIS — N761 Subacute and chronic vaginitis: Secondary | ICD-10-CM

## 2023-03-17 DIAGNOSIS — E785 Hyperlipidemia, unspecified: Secondary | ICD-10-CM

## 2023-03-17 DIAGNOSIS — Z0001 Encounter for general adult medical examination with abnormal findings: Secondary | ICD-10-CM

## 2023-03-17 DIAGNOSIS — N760 Acute vaginitis: Secondary | ICD-10-CM

## 2023-03-17 DIAGNOSIS — I10 Essential (primary) hypertension: Secondary | ICD-10-CM

## 2023-03-17 DIAGNOSIS — E559 Vitamin D deficiency, unspecified: Secondary | ICD-10-CM

## 2023-03-17 DIAGNOSIS — R7301 Impaired fasting glucose: Secondary | ICD-10-CM

## 2023-03-17 DIAGNOSIS — M25561 Pain in right knee: Secondary | ICD-10-CM

## 2023-03-17 MED ORDER — METHYLPREDNISOLONE ACETATE 80 MG/ML IJ SUSP
80.0000 mg | Freq: Once | INTRAMUSCULAR | Status: AC
Start: 2023-03-17 — End: 2023-03-17
  Administered 2023-03-17: 80 mg via INTRAMUSCULAR

## 2023-03-17 MED ORDER — KETOROLAC TROMETHAMINE 60 MG/2ML IM SOLN
60.0000 mg | Freq: Once | INTRAMUSCULAR | Status: AC
Start: 2023-03-17 — End: 2023-03-17
  Administered 2023-03-17: 60 mg via INTRAMUSCULAR

## 2023-03-17 MED ORDER — MELOXICAM 15 MG PO TABS: 15.0000 mg | ORAL_TABLET | Freq: Every day | ORAL | 3 refills | Status: DC

## 2023-03-17 NOTE — Patient Instructions (Addendum)
F/u in 4.5 months, call if you need me sooner  Toradol 60 mg iM and depo medrol 80 mg iM in office today for Knee pain  New for arthritic pain is meloxicam 15 mg one daily, stop diclofenac  BP is excellent  Send specimen to test for BV, Yeast and trichomonas today  Continue therapy  It is important that you exercise regularly at least 30 minutes 5 times a week. If you develop chest pain, have severe difficulty breathing, or feel very tired, stop exercising immediately and seek medical attention   Plant based diet!  Fasting lipid, chem 7 and EGFr and hBA1C 3 to 5 days before next visit  Pls get covid vaccine at your pharmacy  Thanks for choosing [facility], we consider it a privelige to serve you. Best for 2025!

## 2023-03-20 LAB — NUSWAB BV AND CANDIDA, NAA
Candida albicans, NAA: NEGATIVE
Candida glabrata, NAA: NEGATIVE

## 2023-03-21 DIAGNOSIS — Z0001 Encounter for general adult medical examination with abnormal findings: Secondary | ICD-10-CM | POA: Insufficient documentation

## 2023-03-21 DIAGNOSIS — N761 Subacute and chronic vaginitis: Secondary | ICD-10-CM | POA: Insufficient documentation

## 2023-03-21 NOTE — Assessment & Plan Note (Signed)

## 2023-03-21 NOTE — Assessment & Plan Note (Signed)
  Patient re-educated about  the importance of commitment to a  minimum of 150 minutes of exercise per week as able.  The importance of healthy food choices with portion control discussed, as well as eating regularly and within a 12 hour window most days. The need to choose "clean , green" food 50 to 75% of the time is discussed, as well as to make water the primary drink and set a goal of 64 ounces water daily.       03/17/2023    8:11 AM 01/05/2023    3:38 PM 12/23/2022    9:01 AM  Weight /BMI  Weight 322 lb 1.9 oz 322 lb 3.2 oz 318 lb 1.3 oz  Height 5\' 8"  (1.727 m) 5\' 8"  (1.727 m) 5\' 8"  (1.727 m)  BMI 48.98 kg/m2 48.99 kg/m2 48.36 kg/m2

## 2023-03-21 NOTE — Assessment & Plan Note (Signed)
Acute onset with no specific trigger Uncontrolled.Toradol and depo medrol administered IM in the office , to be followed by a short course of oral prednisone and NSAIDS. Me;xicam daily as needed after an initial 5 days
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
## 2023-03-24 ENCOUNTER — Other Ambulatory Visit: Payer: Self-pay | Admitting: Family Medicine

## 2023-03-25 ENCOUNTER — Telehealth: Payer: Self-pay | Admitting: Family Medicine

## 2023-03-25 NOTE — Telephone Encounter (Signed)
Patient called asked if nurse will please return her call. [phone].

## 2023-03-25 NOTE — Telephone Encounter (Signed)
Still having itching in the folds of her labia and its irritated and there is a slight odor. Wants something called in to take care of this. [facility] [place]

## 2023-03-26 MED ORDER — METRONIDAZOLE 500 MG PO TABS
500.0000 mg | ORAL_TABLET | Freq: Two times a day (BID) | ORAL | 0 refills | Status: DC
Start: 2023-03-26 — End: 2023-08-03

## 2023-03-26 MED ORDER — FLUCONAZOLE 150 MG PO TABS: 150.0000 mg | ORAL_TABLET | Freq: Once | ORAL | 0 refills | Status: AC

## 2023-03-26 NOTE — Addendum Note (Signed)
Addended by: Syliva Overman E on: 03/26/2023 01:11 PM   Modules accepted: Orders

## 2023-03-26 NOTE — Telephone Encounter (Signed)
I spoke with pt flagyll tabs and 1 fluconazole are being sent in

## 2023-04-21 ENCOUNTER — Ambulatory Visit: Payer: Commercial Managed Care - PPO | Admitting: Family Medicine

## 2023-04-21 VITALS — BP 111/71 | HR 58 | Ht 68.0 in | Wt 321.0 lb

## 2023-04-21 DIAGNOSIS — R252 Cramp and spasm: Secondary | ICD-10-CM | POA: Diagnosis not present

## 2023-04-21 MED ORDER — DULOXETINE HCL 30 MG PO CPEP: 30.0000 mg | ORAL_CAPSULE | Freq: Every day | ORAL | 3 refills | Status: DC

## 2023-04-21 NOTE — Patient Instructions (Signed)

## 2023-04-21 NOTE — Assessment & Plan Note (Addendum)
Trial on Cymbalta 30 mg once daily Labs ordered to rule out any deficiencies, BMP, Magnesium, and Iron Panel Advise try incorporating regular gentle stretching and low-impact exercises to improve flexibility and circulation, which may help reduce discomfort. Applying heat or cold packs to affected areas can also provide temporary relief.

## 2023-04-21 NOTE — Progress Notes (Signed)
Established Patient Office Visit   Subjective  Patient ID: Shannon Roth, female    DOB: 06-27-1965  Age: 57 y.o. MRN: [id number]  Chief Complaint  Patient presents with   Medical Management of Chronic Issues    Leg pain / cramps at night, causing her to have trouble sleeping. Numbness in feet and hands.     She  has a past medical history of Arthritis, Cough, Hypertension, Migraine, Migraine (06/08/2000), Obesity, and Rectal bleeding.   The patient reports chronic, diffuse pain that began in August 2024, with no known injury to explain its onset. The pain has gradually worsened over the past several months and radiates to the foot, straight leg, and hand. She describes the pain as numbness, aching, sharp, and tingling, with a constant intensity rated at 10/10. Symptoms are particularly severe at night, aggravated by movement and somewhat relieved by sitting down and massaging the affected areas. She has attempted heat application and meloxicam for relief, but these have provided only minimal benefit    Review of Systems  Constitutional:  Negative for chills and fever.  Eyes:  Negative for blurred vision.  Respiratory:  Negative for cough.   Cardiovascular:  Negative for chest pain.  Gastrointestinal:  Negative for abdominal pain.      Objective:     BP 111/71   Pulse (!) 58   Ht 5\' 8"  (1.727 m)   Wt (!) 321 lb 0.6 oz (145.6 kg)   SpO2 91%   BMI 48.81 kg/m  BP Readings from Last 3 Encounters:  04/21/23 111/71  03/17/23 110/64  01/05/23 132/71      Physical Exam Vitals reviewed.  Constitutional:      General: She is not in acute distress.    Appearance: Normal appearance. She is not ill-appearing, toxic-appearing or diaphoretic.  HENT:     Head: Normocephalic.  Eyes:     General:        Right eye: No discharge.        Left eye: No discharge.     Conjunctiva/sclera: Conjunctivae normal.  Cardiovascular:     Rate and Rhythm: Normal rate.     Pulses: Normal  pulses.     Heart sounds: Normal heart sounds.  Pulmonary:     Effort: Pulmonary effort is normal. No respiratory distress.     Breath sounds: Normal breath sounds.  Skin:    General: Skin is warm and dry.     Capillary Refill: Capillary refill takes less than 2 seconds.  Neurological:     Mental Status: She is alert.  Psychiatric:        Mood and Affect: Mood normal.      No results found for any visits on 04/21/23.  The 10-year ASCVD risk score (Arnett DK, et al., 2019) is: 3.6%    Assessment & Plan:  Muscle cramps Assessment & Plan: Trial on Cymbalta 30 mg once daily Labs ordered to rule out any deficiencies, BMP, Magnesium, and Iron Panel Advise try incorporating regular gentle stretching and low-impact exercises to improve flexibility and circulation, which may help reduce discomfort. Applying heat or cold packs to affected areas can also provide temporary relief  Orders: -     BMP8+eGFR -     Iron, TIBC and Ferritin Panel -     Magnesium  Other orders -     DULoxetine HCl; Take 1 capsule (30 mg total) by mouth daily.  Dispense: 30 capsule; Refill: 3    Return if symptoms  worsen or fail to improve.   Cruzita Lederer Newman Nip, FNP

## 2023-05-09 ENCOUNTER — Other Ambulatory Visit: Payer: Self-pay | Admitting: Family Medicine

## 2023-06-07 ENCOUNTER — Encounter: Payer: Self-pay | Admitting: Podiatry

## 2023-06-07 ENCOUNTER — Ambulatory Visit (INDEPENDENT_AMBULATORY_CARE_PROVIDER_SITE_OTHER): Payer: Commercial Managed Care - PPO

## 2023-06-07 ENCOUNTER — Ambulatory Visit (INDEPENDENT_AMBULATORY_CARE_PROVIDER_SITE_OTHER): Payer: Commercial Managed Care - PPO | Admitting: Podiatry

## 2023-06-07 DIAGNOSIS — M76821 Posterior tibial tendinitis, right leg: Secondary | ICD-10-CM

## 2023-06-07 DIAGNOSIS — M79671 Pain in right foot: Secondary | ICD-10-CM

## 2023-06-07 MED ORDER — TRIAMCINOLONE ACETONIDE 10 MG/ML IJ SUSP
10.0000 mg | Freq: Once | INTRAMUSCULAR | Status: AC
  Administered 2023-06-07: 10 mg via INTRA_ARTICULAR

## 2023-06-07 NOTE — Progress Notes (Signed)
Subjective:   Patient ID: Shannon Roth, female   DOB: 57 y.o.   MRN: [id number]   HPI Patient presents stating that she is developed a lot of pain in the right ankle and she is not sure where it came from neuro   ROS      Objective:  Physical Exam  Vascular status intact with inflammation of the medial side of the right ankle around the posterior tibial as it comes underneath the medial side of the foot     Assessment:  Appears to be acute posterior tibial tendinitis right     Plan:  H&P reviewed sterile prep injected the tendon after explaining risk 3 mg Dexasone Kenalog 5 mg Xylocaine and applied sterile dressing.  Reappoint to recheck may require immobilization

## 2023-07-08 ENCOUNTER — Ambulatory Visit (INDEPENDENT_AMBULATORY_CARE_PROVIDER_SITE_OTHER): Payer: Commercial Managed Care - PPO | Admitting: Family Medicine

## 2023-07-08 ENCOUNTER — Encounter (INDEPENDENT_AMBULATORY_CARE_PROVIDER_SITE_OTHER): Payer: Self-pay | Admitting: Family Medicine

## 2023-07-08 VITALS — BP 164/80 | HR 64 | Temp 98.2°F | Ht 66.0 in | Wt 318.0 lb

## 2023-07-08 DIAGNOSIS — E559 Vitamin D deficiency, unspecified: Secondary | ICD-10-CM

## 2023-07-08 DIAGNOSIS — Z6841 Body Mass Index (BMI) 40.0 and over, adult: Secondary | ICD-10-CM

## 2023-07-08 DIAGNOSIS — I10 Essential (primary) hypertension: Secondary | ICD-10-CM | POA: Diagnosis not present

## 2023-07-08 NOTE — Progress Notes (Signed)
Carlye Grippe, DO, ABFM, ABOM Bariatric physician [address] [place], [place], [place] [address] Office: [phone]  /  Fax: [phone]     Initial Evaluation:  Shannon Roth was seen in clinic today to evaluate for obesity. She is interested in losing weight to improve overall health and reduce the risk of weight related complications. She presents today to review program treatment options, initial physical assessment, and evaluation.     She was referred by: Self-Referral  When asked how has your weight affected you? She states: Contributed to medical problems, Contributed to orthopedic problems or mobility issues, Having fatigue, and Having poor endurance  Contributing factors to her weight change: Family history of obesity, Moderate to high levels of stress, and Reduced physical activity  Some associated conditions: Hypertension, GERD, Vitamin D Deficiency, and Other: Arthritis  Current nutrition plan: None  Current level of physical activity: None  Current or previous pharmacotherapy: Rybelsus for 1-2 months  Response to medication: Was cost prohibitive.   @MEDCOMM @    Past Medical History:  Diagnosis Date   Arthritis    Cough    Hypertension    Migraine    Migraine 06/08/2000   Obesity    Rectal bleeding      Objective:  BP (!) 164/80   Pulse 64   Temp 98.2 F (36.8 C)   Ht 5\' 6"  (1.676 m)   Wt (!) 318 lb (144.2 kg)   SpO2 100%   BMI 51.33 kg/m  She was weighed on the bioimpedance scale: Body mass index is 51.33 kg/m.  Visceral Fat %: 22, Body Fat %: 55.2  Weight Lost Since Last Visit: N/A  Weight Gained Since Last Visit: N/A   Vitals Temp: 98.2 F (36.8 C) BP: (!) 164/80 Pulse Rate: 64 SpO2: 100 %   Anthropometric Measurements Height: 5\' 6"  (1.676 m) Weight: (!) 318 lb (144.2 kg) BMI (Calculated): 51.35 Weight at Last Visit: N/A Weight Lost Since Last Visit: N/A Weight Gained Since Last Visit: N/A Starting Weight: N/A   Body  Composition  Body Fat %: 55.2 % Fat Mass (lbs): 175.6 lbs Muscle Mass (lbs): 135.2 lbs Total Body Water (lbs): 96.2 lbs Visceral Fat Rating : 22   Other Clinical Data Fasting: No Labs: No Today's Visit #: Info Session Comments: Information Session    General: Well Developed, well nourished, and in no acute distress.  HEENT: Normocephalic, atraumatic; EOMI, sclerae are anicteric. Skin: Warm and dry, good turgor Chest:  Normal excursion, shape, no gross ABN Respiratory: No conversational dyspnea; speaking in full sentences NeuroM-Sk:  Normal gross ROM * 4 extremities  Psych: A and O *3, insight adequate, mood- full   Assessment and Plan:   FOR THE DISEASE OF OBESITY:  Morbid obesity (HCC) Assessment & Plan: We reviewed anthropometrics, biometrics, associated medical conditions and contributing factors with patient.   Shatasia would benefit from a medically tailored reduced calorie nutrional plan based on her REE (resting energy expenditure), which will be determined by indirect calorimetry.  We will also assess for cardiometabolic risk and nutritional derangements via fasting labs at intake appointment.    Obesity Treatment / Action Plan:   she was weighed on the bioimpedance scale and results were discussed and documented in the synopsis.   She will complete provided nutritional and psychosocial assessment questionnaire before the next appointment.  she will be scheduled for indirect calorimetry to determine resting energy expenditure in a fasting state.  This will allow [place] to create a reduced calorie,  high-protein meal plan to promote loss of fat mass while preserving muscle mass.  We will also assess for cardiometabolic risk and nutritional derangements via an ECG and fasting serologies at her next appointment.  she was encouraged to work on amassing support from family and friends to begin their weight loss journey.   Work on eliminating or reducing the presence of  highly processed, poorly nutritious, calorie-dense foods in the home.   Obesity Education Performed Today:  Patient was counseled on nutritional approaches to weight loss and benefits of reducing processed foods and consuming plant-based foods and high quality protein as part of nutritional weight management program.   We discussed the importance of long term lifestyle changes which include nutrition, exercise and behavioral modifications as well as the importance of customizing this to her specific health and social needs.   We discussed the benefits of reaching a healthier weight to alleviate the symptoms of existing conditions and reduce the risks of the biomechanical, metabolic and psychological effects of obesity.  Was counseled on the health benefits of losing 5%-10% of total body weight.  Was counseled on our cognitive behavorial therapy program, lead by our bariatric psychologist, who focuses on emotional eating and creating positive behavorial change.  Was counseled on bariatric pharmacotherapy and how this may be used as an adjunct in their weight management   Shirin appears to be in the action stage of change and states they are ready to start intensive lifestyle modifications and behavioral modifications.  It was recommended that she follow up in the next 1-2 weeks to review the above steps, and to continue with treatment of their chronic disease state of obesity   FOR OTHER CONDITIONS RELATED TO THE DISEASE OF OBESITY:  Essential hypertension Assessment & Plan: Last 3 blood pressure readings in our office are as follows: BP Readings from Last 3 Encounters:  07/08/23 (!) 164/80  04/21/23 111/71  03/17/23 110/64   Pt states she has a strong fhx of hypertension. She was diagnosed with HTN at 58 y.o. HTN treated with Olmesartan and Spironolactone. Blood pressure above target today - she reports forgetting to take her medications last night. Continue with all  antihypertensives. Pt may benefit from a low salt, heart healthy meal plan. Losing 10% or more of body weight may improve condition.     Vitamin D deficiency Assessment & Plan: Pt is on prescription high dose vitamin D once weekly without any complications - has been on this for 3-4 yrs. Continue vitamin D supplementation. Weight loss will likely improve availability of vitamin D.    Attestations:   Reviewed by clinician on day of visit: allergies, medications, problem list, medical history, surgical history, family history, social history, and previous encounter notes pertinent to obesity diagnosis. 46 minutes was spent today on this visit including the above counseling, pre-visit chart review, and post-visit documentation.  Over 50% of this time was spent in direct, face-to-face counseling and coordination of care  I, Special Randolm Idol , acting as a medical scribe for Thomasene Lot, DO., have compiled all relevant documentation for today's office visit on behalf of Thomasene Lot, DO, while in the presence of [employer], DO.  I have reviewed the above documentation for accuracy and completeness, and I agree with the above. Carlye Grippe, D.O.  The 21st Century Cures Act was signed into law in 2016 which includes the topic of electronic health records.  This provides immediate access to information in MyChart.  This includes consultation notes, operative  notes, office notes, lab results and pathology reports.  If you have any questions about what you read please let us know at your next visit so we can discuss your concerns and take corrective action if need be.  We are right here with you!

## 2023-07-13 ENCOUNTER — Encounter (INDEPENDENT_AMBULATORY_CARE_PROVIDER_SITE_OTHER): Payer: Commercial Managed Care - PPO | Admitting: Family Medicine

## 2023-08-03 ENCOUNTER — Ambulatory Visit (INDEPENDENT_AMBULATORY_CARE_PROVIDER_SITE_OTHER): Payer: Commercial Managed Care - PPO | Admitting: Family Medicine

## 2023-08-03 ENCOUNTER — Encounter: Payer: Self-pay | Admitting: Family Medicine

## 2023-08-03 VITALS — BP 110/80 | Resp 14 | Ht 68.0 in | Wt 312.0 lb

## 2023-08-03 DIAGNOSIS — I10 Essential (primary) hypertension: Secondary | ICD-10-CM | POA: Diagnosis not present

## 2023-08-03 DIAGNOSIS — Z1231 Encounter for screening mammogram for malignant neoplasm of breast: Secondary | ICD-10-CM

## 2023-08-03 DIAGNOSIS — R252 Cramp and spasm: Secondary | ICD-10-CM | POA: Insufficient documentation

## 2023-08-03 DIAGNOSIS — E559 Vitamin D deficiency, unspecified: Secondary | ICD-10-CM | POA: Diagnosis not present

## 2023-08-03 DIAGNOSIS — R7989 Other specified abnormal findings of blood chemistry: Secondary | ICD-10-CM | POA: Diagnosis not present

## 2023-08-03 MED ORDER — CYCLOBENZAPRINE HCL 5 MG PO TABS: 5.0000 mg | ORAL_TABLET | Freq: Every day | ORAL | 2 refills | Status: DC

## 2023-08-03 MED ORDER — IBUPROFEN 800 MG PO TABS: ORAL_TABLET | ORAL | 0 refills | Status: DC

## 2023-08-03 NOTE — Patient Instructions (Addendum)
 F/u in 4 monhts, call if you need me sooner  Please schedule mammogram at checkout  Labs today already ordered in October and in [place], also vit d and magnesium levels  New for leg cramps is cyclobenzaprine one at bedtime  cONGRATS on weight loss, for every 1 pound you lose 4 pounds of pressure is taken off of your knees  It is important that you exercise regularly at least 30 minutes 5 times a week. If you develop chest pain, have severe difficulty breathing, or feel very tired, stop exercising immediately and seek medical attention    Thanks for choosing [facility], we consider it a privelige to serve you.

## 2023-08-03 NOTE — Progress Notes (Signed)
 Shannon Roth     MRN: [id number]      DOB: 14-Apr-1966  Chief Complaint  Patient presents with   Hypertension    I have disabling cramps/ spasms in both feet, mainly in the mornings, and nothing is helping    HPI Shannon Roth is here for follow up and re-evaluation of chronic medical conditions, medication management and review of any available recent lab and radiology data.  C/o excess cramping , in both feet, tenderness under muscle of skin , pain with touching Preventive health is updated, specifically  Cancer screening and Immunization.   Questions or concerns regarding consultations or procedures which the PT has had in the interim are  addressed. The PT denies any adverse reactions to current medications since the last visit.  will be trying symvisc injections for knee pain and have been told I need replacement , no time for that now, have to finish school States ibuprofen more bneficial than meloxicam for pain and will use limited anount ROS Denies recent fever or chills. Denies sinus pressure, nasal congestion, ear pain or sore throat. Denies chest congestion, productive cough or wheezing. Denies chest pains, palpitations and leg swelling Denies abdominal pain, nausea, vomiting,diarrhea or constipation.   Denies dysuria, frequency, hesitancy or incontinence. . Denies headaches, seizures, numbness, or tingling. Denies depression, anxiety or insomnia. Denies skin break down or rash.   PE  BP 110/80 (BP Location: Right Arm)   Resp 14   Ht 5\' 8"  (1.727 m)   Wt (!) 312 lb (141.5 kg)   SpO2 94%   BMI 47.44 kg/m  Patient alert and oriented and in no cardiopulmonary distress.  HEENT: No facial asymmetry, EOMI,     Neck supple .  Chest: Clear to auscultation bilaterally.  CVS: S1, S2 no murmurs, no S3.Regular rate.  ABD: Soft non tender.   Ext: No edema  MS: Adequate ROM spine, shoulders, hips and reduced in  knees.  Skin: Intact, no ulcerations or rash  noted.  Psych: Good eye contact, normal affect. Memory intact not anxious or depressed appearing.  CNS: CN 2-12 intact, power,  normal throughout.no focal deficits noted.   Assessment & Plan  Muscle cramps Uncontrolled , check calcium, magnesium and potssium, trial of flexeril at bedtime  Vitamin D deficiency Updated lab needed at/ before next visit.   Morbid obesity Improved  Patient re-educated about  the importance of commitment to a  minimum of 150 minutes of exercise per week as able.  The importance of healthy food choices with portion control discussed, as well as eating regularly and within a 12 hour window most days. The need to choose "clean , green" food 50 to 75% of the time is discussed, as well as to make water the primary drink and set a goal of 64 ounces water daily.       08/03/2023    8:10 AM 07/08/2023   11:00 AM 04/21/2023    1:24 PM  Weight /BMI  Weight 312 lb 318 lb 321 lb 0.6 oz  Height 5\' 8"  (1.727 m) 5\' 6"  (1.676 m) 5\' 8"  (1.727 m)  BMI 47.44 kg/m2 51.33 kg/m2 48.81 kg/m2      Essential hypertension Controlled, no change in medication DASH diet and commitment to daily physical activity for a minimum of 30 minutes discussed and encouraged, as a part of hypertension management. The importance of attaining a healthy weight is also discussed.     08/03/2023    8:10 AM 07/08/2023  11:58 AM 07/08/2023   11:00 AM 04/21/2023    1:32 PM 04/21/2023    1:24 PM 03/17/2023    8:11 AM 01/05/2023    3:38 PM  BP/Weight  Systolic BP 110 164 149 111 155 110 132  Diastolic BP 80 80 83 71 72 64 71  Wt. (Lbs) 312  318  321.04 322.12 322.2  BMI 47.44 kg/m2  51.33 kg/m2  48.81 kg/m2 48.98 kg/m2 48.99 kg/m2

## 2023-08-03 NOTE — Assessment & Plan Note (Signed)
 Controlled, no change in medication DASH diet and commitment to daily physical activity for a minimum of 30 minutes discussed and encouraged, as a part of hypertension management. The importance of attaining a healthy weight is also discussed.     08/03/2023    8:10 AM 07/08/2023   11:58 AM 07/08/2023   11:00 AM 04/21/2023    1:32 PM 04/21/2023    1:24 PM 03/17/2023    8:11 AM 01/05/2023    3:38 PM  BP/Weight  Systolic BP 110 164 149 111 155 110 132  Diastolic BP 80 80 83 71 72 64 71  Wt. (Lbs) 312  318  321.04 322.12 322.2  BMI 47.44 kg/m2  51.33 kg/m2  48.81 kg/m2 48.98 kg/m2 48.99 kg/m2

## 2023-08-03 NOTE — Assessment & Plan Note (Signed)
 Improved  Patient re-educated about  the importance of commitment to a  minimum of 150 minutes of exercise per week as able.  The importance of healthy food choices with portion control discussed, as well as eating regularly and within a 12 hour window most days. The need to choose "clean , green" food 50 to 75% of the time is discussed, as well as to make water the primary drink and set a goal of 64 ounces water daily.       08/03/2023    8:10 AM 07/08/2023   11:00 AM 04/21/2023    1:24 PM  Weight /BMI  Weight 312 lb 318 lb 321 lb 0.6 oz  Height 5\' 8"  (1.727 m) 5\' 6"  (1.676 m) 5\' 8"  (1.727 m)  BMI 47.44 kg/m2 51.33 kg/m2 48.81 kg/m2

## 2023-08-03 NOTE — Assessment & Plan Note (Signed)
 Updated lab needed at/ before next visit.

## 2023-08-03 NOTE — Assessment & Plan Note (Signed)
 Uncontrolled , check calcium, magnesium and potssium, trial of flexeril at bedtime

## 2023-08-04 ENCOUNTER — Encounter: Payer: Self-pay | Admitting: Family Medicine

## 2023-08-04 LAB — MAGNESIUM: Magnesium: 2.3 mg/dL (ref 1.6–2.3)

## 2023-08-04 LAB — VITAMIN D 25 HYDROXY (VIT D DEFICIENCY, FRACTURES): Vit D, 25-Hydroxy: 31.6 ng/mL (ref 30.0–100.0)

## 2023-08-07 ENCOUNTER — Other Ambulatory Visit: Payer: Self-pay | Admitting: Family Medicine

## 2023-08-12 ENCOUNTER — Encounter: Payer: Self-pay | Admitting: Nurse Practitioner

## 2023-08-12 ENCOUNTER — Telehealth: Admitting: Nurse Practitioner

## 2023-08-12 DIAGNOSIS — R6889 Other general symptoms and signs: Secondary | ICD-10-CM

## 2023-08-12 DIAGNOSIS — R1110 Vomiting, unspecified: Secondary | ICD-10-CM | POA: Diagnosis not present

## 2023-08-12 DIAGNOSIS — R059 Cough, unspecified: Secondary | ICD-10-CM | POA: Diagnosis not present

## 2023-08-12 DIAGNOSIS — R093 Abnormal sputum: Secondary | ICD-10-CM

## 2023-08-12 MED ORDER — ONDANSETRON 4 MG PO TBDP: 4.0000 mg | ORAL_TABLET | Freq: Three times a day (TID) | ORAL | 0 refills | Status: DC | PRN

## 2023-08-12 MED ORDER — PROMETHAZINE-DM 6.25-15 MG/5ML PO SYRP: 5.0000 mL | ORAL_SOLUTION | Freq: Four times a day (QID) | ORAL | 0 refills | Status: DC | PRN

## 2023-08-12 MED ORDER — BENZONATATE 100 MG PO CAPS: 200.0000 mg | ORAL_CAPSULE | Freq: Three times a day (TID) | ORAL | 0 refills | Status: DC | PRN

## 2023-08-12 NOTE — Progress Notes (Signed)
 Name: Shannon Roth   MRN: [id number]    DOB: [birth year]   Date:08/12/2023       Progress Note  Subjective  Chief Complaint  Chief Complaint  Patient presents with   URI    Cough, congestion and vomiting for 2 days    I connected with  Nigel Bridgeman  on 08/12/23 at  8:40 AM EST by a video enabled telemedicine application and verified that I am speaking with the correct person using two identifiers.  I discussed the limitations of evaluation and management by telemedicine and the availability of in person appointments. The patient expressed understanding and agreed to proceed with a virtual visit  Staff also discussed with the patient that there may be a patient responsible charge related to this service. Patient Location: home Provider Location: [facility] Additional Individuals present: alone  HPI  Discussed the use of AI scribe software for clinical note transcription with the patient, who gave verbal consent to proceed.  History of Present Illness   Shannon Roth is a 58 year old female who presents with flu-like symptoms.  She has been experiencing flu-like symptoms for the past two days, including a persistent cough, significant phlegm production, and frequent vomiting. These symptoms have been severe enough to impact her daily activities.  She mentions that her grandson had a virus earlier in the week, and she began feeling unwell after caring for him on Monday. Since then, both she and her grandson's father have been ill.  She has attempted to manage her symptoms with over-the-counter medications such as Alka Seltzer and Theraflu, but these have not provided relief. Her illness has disrupted her attendance at school.    Patient Active Problem List   Diagnosis Date Noted   Low vitamin D level 08/03/2023   Cramps of lower extremity 08/03/2023   Annual visit for general adult medical examination with abnormal findings 03/21/2023   Chronic vaginitis 03/21/2023   Dyslipidemia  12/23/2022   Nasal sinus congestion 11/09/2022   Muscle cramps 07/26/2022   Lab test positive for detection of COVID-19 virus 05/17/2022   Encounter for annual physical exam 02/22/2022   Family history of coronary artery disease 11/12/2021   Lack of adequate sleep 10/14/2021   Fatigue 10/14/2021   Chronic pain in left foot 05/21/2021   Allergic sinusitis 01/12/2021   Skin lesion of breast 10/25/2020   Vitamin D deficiency 01/12/2020   Presbycusis of both ears 01/08/2020   Essential hypertension 12/19/2019   Foot pain, bilateral 12/19/2019   IBS (irritable bowel syndrome) 09/06/2019   Fatty liver 05/05/2017   Elevated ferritin 05/05/2017   Headache 09/03/2016   Knee pain, right 09/03/2016   Abnormal CT of liver 12/24/2015   Microcytic anemia 12/24/2015   Liver lesion, right lobe 12/19/2015   H/O asbestos exposure 12/03/2015   GERD (gastroesophageal reflux disease) 12/03/2015   Shortness of breath 12/03/2015   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    Anxiety 02/17/2012   Metabolic syndrome 10/21/2011   Back pain with left-sided radiculopathy 11/29/2009   Morbid obesity (HCC) 09/13/2007   Migraines 09/13/2007   RECTAL BLEEDING, HX OF 09/13/2007    Social History   Tobacco Use   Smoking status: Never    Passive exposure: Yes   Smokeless tobacco: Never   Tobacco comments:    Father & close friend  Substance Use Topics   Alcohol use: No    Alcohol/week: 0.0 standard drinks of alcohol  Current Outpatient Medications:    benzonatate (TESSALON PERLES) 100 MG capsule, Take 2 capsules (200 mg total) by mouth 3 (three) times daily as needed., Disp: 20 capsule, Rfl: 0   cyclobenzaprine (FLEXERIL) 5 MG tablet, Take 1 tablet (5 mg total) by mouth at bedtime., Disp: 30 tablet, Rfl: 2   DULoxetine (CYMBALTA) 30 MG capsule, Take 1 capsule (30 mg total) by mouth daily., Disp: 30 capsule, Rfl: 3   fluticasone (FLONASE) 50 MCG/ACT nasal spray, Place 2  sprays into both nostrils daily., Disp: 16 g, Rfl: 0   folic acid (FOLVITE) 1 MG tablet, TAKE 1 TABLET BY MOUTH DAILY, Disp: 90 tablet, Rfl: 0   ibuprofen (ADVIL) 800 MG tablet, Take one tablet by mouth two times weekly , if needed, for uncontrolled pain, Disp: 30 tablet, Rfl: 0   olmesartan (BENICAR) 20 MG tablet, TAKE 1 TABLET(20 MG) BY MOUTH DAILY, Disp: 30 tablet, Rfl: 1   omeprazole (PRILOSEC) 20 MG capsule, TAKE 1 CAPSULE(20 MG) BY MOUTH DAILY, Disp: 30 capsule, Rfl: 3   ondansetron (ZOFRAN-ODT) 4 MG disintegrating tablet, Take 1 tablet (4 mg total) by mouth every 8 (eight) hours as needed for nausea or vomiting., Disp: 20 tablet, Rfl: 0   promethazine-dextromethorphan (PROMETHAZINE-DM) 6.25-15 MG/5ML syrup, Take 5 mLs by mouth 4 (four) times daily as needed for cough., Disp: 118 mL, Rfl: 0   spironolactone (ALDACTONE) 100 MG tablet, TAKE 1 TABLET(100 MG) BY MOUTH DAILY, Disp: 90 tablet, Rfl: 3   Vitamin D, Ergocalciferol, (DRISDOL) 1.25 MG (50000 UNIT) CAPS capsule, Take 1 capsule (50,000 Units total) by mouth every 7 (seven) days., Disp: 5 capsule, Rfl: 8  Allergies  Allergen Reactions   Penicillin G Other (See Comments)    GI Intolerance    Tramadol    Codeine Nausea And Vomiting   Gabapentin Rash   Penicillins Rash    I personally reviewed active problem list, medication list, allergies with the patient/caregiver today.  ROS  Ten systems reviewed and is negative except as mentioned in HPI   Objective  Virtual encounter, vitals not obtained.  There is no height or weight on file to calculate BMI.  Nursing Note and Vital Signs reviewed.  Physical Exam  Awake, alert and oriented, speaking in complete sentences   No results found for this or any previous visit (from the past 72 hours).  Assessment & Plan  Assessment and Plan    Influenza-like illness Symptoms of cough, phlegm, and vomiting for two days. Possible exposure to a viral illness from her  grandson. -Prescribe antiemetic medication for nausea and vomiting. -Prescribe Tessalon Perles (benzonatate) for cough suppression. -Prescribe cough syrup phenergan-DM for additional cough suppression, with caution about potential drowsiness. -Advise to continue over-the-counter medications including an antihistamine (Zyrtec or Claritin) and Flonase for sinus congestion. -Recommend plain Mucinex for mucus production. -Advise to seek emergency care if unable to maintain hydration due to vomiting. -Prescriptions to be sent to [facility] on [address].       Push fluids and get rest  -Red flags and when to present for emergency care or RTC including fever >101.61F, chest pain, shortness of breath, new/worsening/un-resolving symptoms,  reviewed with patient at time of visit. Follow up and care instructions discussed and provided in AVS. - I discussed the assessment and treatment plan with the patient. The patient was provided an opportunity to ask questions and all were answered. The patient agreed with the plan and demonstrated an understanding of the instructions.  I provided 15 minutes  of non-face-to-face time during this encounter.  Berniece Salines, FNP

## 2023-08-16 NOTE — Telephone Encounter (Signed)
 Copied from CRM [id number]. Topic: Clinical - Prescription Issue >> Aug 16, 2023  1:09 PM Dondra Prader E wrote: Reason for CRM: Pt called reporting that she is not feeling any better and the medicine she received per her visit last week is not improving her symptoms. Patient wants to see her PCP but she does not have any appts until May. Please call patient and relay symptoms to PCP, she wants something prescribed by her PCP that will help.   Best contact: [phone]

## 2023-08-18 ENCOUNTER — Other Ambulatory Visit: Payer: Self-pay | Admitting: Family Medicine

## 2023-08-19 NOTE — Telephone Encounter (Signed)
 Contacted patient for an acute visit. Will contact patient with any openings, did recommend worse follow up with urgent care, per patient has already been to urgent care 3 times.

## 2023-08-19 NOTE — Telephone Encounter (Signed)
 Patient calling back has not heard back from nurse or provider.  See below message.  Patient would like a return call today [phone]

## 2023-08-24 ENCOUNTER — Other Ambulatory Visit: Payer: Self-pay | Admitting: Family Medicine

## 2023-08-26 ENCOUNTER — Ambulatory Visit (INDEPENDENT_AMBULATORY_CARE_PROVIDER_SITE_OTHER): Admitting: Podiatry

## 2023-08-26 ENCOUNTER — Encounter: Payer: Self-pay | Admitting: Podiatry

## 2023-08-26 DIAGNOSIS — L600 Ingrowing nail: Secondary | ICD-10-CM

## 2023-08-26 NOTE — Patient Instructions (Addendum)
 Place 1/4 cup of epsom salts in a quart of warm tap water.  Submerge your foot or feet in the solution and soak for 20 minutes.  This soak should be done twice a day.  Next, remove your foot or feet from solution, blot dry the affected area. Apply ointment and cover if instructed by your doctor.   IF YOUR SKIN BECOMES IRRITATED WHILE USING THESE INSTRUCTIONS, IT IS OKAY TO SWITCH TO  WHITE VINEGAR AND WATER.  As another alternative soak, you may use antibacterial soap and water.  Monitor for any signs/symptoms of infection. Call the office immediately if any occur or go directly to the emergency room. Call with any questions/concerns.  Okay to apply small amount of Neosporin to the ingrown toenail site for the first 5 to 7 days.  After that stop any ointment use.  Continue to soak and bandage the toe until a dry scab starts to form.  Allow toe to air dry before applying Band-Aid.

## 2023-08-26 NOTE — Progress Notes (Signed)
 Subjective:  Patient ID: Shannon Roth, female    DOB: 1965-12-22,  MRN: [id number]  Shannon Roth presents to clinic today for:  Chief Complaint  Patient presents with   Ingrown Toenail    Rm 18: left foot big toe ingrown. Pt had a pedicure and tech told her that she saw something but could not get it out. Pt stated that Left great toe and 2nd toe has toenail discoloration and has never had that happen before.  Patient is presenting for above complaint.  He is concerned about developing ingrown toenail to the left hallux lateral border.  She has been dealing with a lot of tenderness to the nail fold.  Has not noticed any drainage.  She is also concerned about developing fungal nail infection left first and second toenail.  PCP is Kerri Perches, MD.  Allergies  Allergen Reactions   Penicillin G Other (See Comments)    GI Intolerance    Tramadol    Codeine Nausea And Vomiting   Gabapentin Rash   Penicillins Rash    Review of Systems: Negative except as noted in the HPI.  Objective:  There were no vitals filed for this visit.  RICHARDINE PEPPERS is a pleasant 58 y.o. female in NAD. AAO x 3.  Vascular Examination: Capillary refill time is less than 3 seconds to toes bilateral. Palpable pedal pulses b/l LE. Digital hair present b/l. No pedal edema b/l. Skin temperature gradient WNL b/l. No varicosities b/l. No cyanosis or clubbing noted b/l.   Dermatological Examination: There is incurvation of the left hallux lateral nail border.  There is pain on palpation of the affected nail border.  No significant erythema or edema present.  No drainage.  Significant hyperkeratotic callus along nail fold present.  There is some thickening and darkened and yellowish discoloration to the toenails, left second and first toenail in particular.  Neurological Examination: Protective sensation intact with Semmes-Weinstein 10 gram monofilament b/l LE. Vibratory sensation intact b/l LE.      Latest Ref Rng & Units 12/23/2022    9:57 AM  Hemoglobin A1C  Hemoglobin-A1c 4.8 - 5.6 % 5.3      Assessment/Plan: 1. Ingrown toenail of left foot     No orders of the defined types were placed in this encounter.   Discussed patient's condition today.  After obtaining patient consent, the left hallux was anesthetized with a 50:50 mixture of 1% lidocaine plain and 0.5% bupivacaine plain for a total of 3cc's administered.  Upon confirmation of anesthesia, a freer elevator was utilized to free the lateral nail border from the nail bed.  The nail border was then avulsed proximal to the eponychium and removed in toto.  The area was inspected for any remaining spicules.  A chemical matrixectomy was performed with NaOH and neutralized with acetic acid solution.  Antibiotic ointment and a DSD were applied, followed by a Coban dressing.  Patient tolerated the anesthetic and procedure well and will f/u in 2-3 weeks for recheck.  Patient given post-procedure instructions for daily 15-minute Epsom salt soaks, antibiotic ointment and daily use of Bandaids until toe starts to dry / form eschar.   We will obtain nail specimen from the toenails of concern for fungal pathology at follow-up visit.   Return in about 2 weeks (around 09/09/2023) for Nail Check.   Ram Haugan L. Marchia Bond, AACFAS [facility]     [address]  [place], [place] [address]                Office [phone]  Fax [phone]

## 2023-09-01 ENCOUNTER — Ambulatory Visit (INDEPENDENT_AMBULATORY_CARE_PROVIDER_SITE_OTHER): Admitting: Internal Medicine

## 2023-09-01 ENCOUNTER — Encounter: Payer: Self-pay | Admitting: Internal Medicine

## 2023-09-01 VITALS — BP 102/61 | HR 63 | Ht 68.0 in | Wt 316.6 lb

## 2023-09-01 DIAGNOSIS — J209 Acute bronchitis, unspecified: Secondary | ICD-10-CM | POA: Diagnosis not present

## 2023-09-01 MED ORDER — AZITHROMYCIN 250 MG PO TABS: ORAL_TABLET | ORAL | 0 refills | Status: AC

## 2023-09-01 MED ORDER — PROMETHAZINE-DM 6.25-15 MG/5ML PO SYRP
5.0000 mL | ORAL_SOLUTION | Freq: Four times a day (QID) | ORAL | 0 refills | Status: DC | PRN
Start: 2023-09-01 — End: 2023-09-16

## 2023-09-01 MED ORDER — METHYLPREDNISOLONE ACETATE 80 MG/ML IJ SUSP
80.0000 mg | Freq: Once | INTRAMUSCULAR | Status: AC
  Administered 2023-09-01: 80 mg via INTRAMUSCULAR

## 2023-09-01 MED ORDER — ALBUTEROL SULFATE HFA 108 (90 BASE) MCG/ACT IN AERS: 2.0000 | INHALATION_SPRAY | Freq: Four times a day (QID) | RESPIRATORY_TRACT | 0 refills | Status: DC | PRN

## 2023-09-01 NOTE — Patient Instructions (Signed)
 Please start taking Azithromycin as prescribed.  Please use Albuterol inhaler as needed for shortness of breath or wheezing.  Please use Phenol throat spray as needed for sore throat.

## 2023-09-01 NOTE — Progress Notes (Signed)
 Acute Office Visit  Subjective:    Patient ID: Shannon Roth, female    DOB: 08-Dec-1965, 57 y.o.   MRN: [id number]  Chief Complaint  Patient presents with   Cough    Pt reports sx of ongoing cough, has yellow mucus. Had the flu the first week of march, worried about pneumonia.     HPI Patient is in today for complaint of persistent cough for the last 3 weeks.  She had flulike symptoms about 3 weeks ago, had a virtual visit with e-visit provider and was given symptomatic treatment with promethazine-DM syrup, Tessalon and Zofran.  She currently denies any fever or chills.  She has persistent cough, exertional dyspnea and mild wheezing at times.  She has noticed yellowish expectoration at times.  Past Medical History:  Diagnosis Date   Arthritis    Cough    Hypertension    Migraine    Migraine 06/08/2000   Obesity    Rectal bleeding     Past Surgical History:  Procedure Laterality Date   bilateral BIOPSY BREAST  2002   benign   Bilateral tubal ligation  2001   BIOPSY  05/17/2018   Procedure: BIOPSY;  Surgeon: West Bali, MD;  Location: [facility];  Service: Endoscopy;;  duodenum and gastric   CHOLECYSTECTOMY  2003   COLONOSCOPY N/A 06/21/2015   SLF: hyperplastic polyps removed. next TCS 10 years   ESOPHAGOGASTRODUODENOSCOPY (EGD) WITH PROPOFOL N/A 05/17/2018   Procedure: ESOPHAGOGASTRODUODENOSCOPY (EGD) WITH PROPOFOL;  Surgeon: West Bali, MD;  Location: [facility];  Service: Endoscopy;  Laterality: N/A;  1:45pm   EYE SURGERY     right cataract removal    GIVENS CAPSULE STUDY N/A 05/17/2018   Procedure: GIVENS CAPSULE STUDY;  Surgeon: West Bali, MD;  Location: [facility];  Service: Endoscopy;  Laterality: N/A;   TOTAL ABDOMINAL HYSTERECTOMY W/ BILATERAL SALPINGOOPHORECTOMY  2003   TUBAL LIGATION      Family History  Problem Relation Age of Onset   Asthma Mother    Diabetes Mother    Arthritis Mother    Heart disease Mother    Hypertension  Father    Emphysema Paternal Grandfather    Emphysema Maternal Grandmother    Lung cancer Maternal Grandmother    Breast cancer Sister    Stroke Sister    Diabetes Brother    Heart disease Brother    Colon cancer Paternal Uncle        48s   Liver disease Neg Hx     Social History   Socioeconomic History   Marital status: Single    Spouse name: Not on file   Number of children: 1   Years of education: Not on file   Highest education level: Not on file  Occupational History   Occupation: Social Services  Tobacco Use   Smoking status: Never    Passive exposure: Yes   Smokeless tobacco: Never   Tobacco comments:    Father & close friend  Vaping Use   Vaping status: Never Used  Substance and Sexual Activity   Alcohol use: No    Alcohol/week: 0.0 standard drinks of alcohol   Drug use: No   Sexual activity: Not on file  Other Topics Concern   Not on file  Social History Narrative   Originally from [place]. 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 She reports that she wore a full body suit but questionable respiratory equipment for protection. No bird,  mold, or hot tub exposure.    Social Drivers of Corporate investment banker Strain: Not on file  Food Insecurity: Not on file  Transportation Needs: Not on file  Physical Activity: Not on file  Stress: Not on file  Social Connections: Unknown (10/20/2021)   Received from [facility]   Social Network    Social Network: Not on file  Intimate Partner Violence: Unknown (09/11/2021)   Received from [facility], [facility]   HITS    Physically Hurt: Not on file    Insult or Talk Down To: Not on file    Threaten Physical Harm: Not on file    Scream or Curse: Not on file    Outpatient Medications Prior to Visit  Medication Sig Dispense Refill   benzonatate (TESSALON PERLES) 100 MG capsule Take 2 capsules (200 mg total) by mouth 3 (three) times daily as needed. 20 capsule 0    cyclobenzaprine (FLEXERIL) 5 MG tablet Take 1 tablet (5 mg total) by mouth at bedtime. 30 tablet 2   DULoxetine (CYMBALTA) 30 MG capsule Take 1 capsule (30 mg total) by mouth daily. 30 capsule 3   fluticasone (FLONASE) 50 MCG/ACT nasal spray Place 2 sprays into both nostrils daily. 16 g 0   folic acid (FOLVITE) 1 MG tablet TAKE 1 TABLET BY MOUTH DAILY 90 tablet 0   ibuprofen (ADVIL) 800 MG tablet Take one tablet by mouth two times weekly , if needed, for uncontrolled pain 30 tablet 0   olmesartan (BENICAR) 20 MG tablet TAKE 1 TABLET(20 MG) BY MOUTH DAILY 30 tablet 1   omeprazole (PRILOSEC) 20 MG capsule TAKE 1 CAPSULE(20 MG) BY MOUTH DAILY 30 capsule 3   ondansetron (ZOFRAN-ODT) 4 MG disintegrating tablet Take 1 tablet (4 mg total) by mouth every 8 (eight) hours as needed for nausea or vomiting. 20 tablet 0   spironolactone (ALDACTONE) 100 MG tablet TAKE 1 TABLET(100 MG) BY MOUTH DAILY 90 tablet 3   Vitamin D, Ergocalciferol, (DRISDOL) 1.25 MG (50000 UNIT) CAPS capsule Take 1 capsule (50,000 Units total) by mouth every 7 (seven) days. 5 capsule 8   promethazine-dextromethorphan (PROMETHAZINE-DM) 6.25-15 MG/5ML syrup Take 5 mLs by mouth 4 (four) times daily as needed for cough. 118 mL 0   No facility-administered medications prior to visit.    Allergies  Allergen Reactions   Penicillin G Other (See Comments)    GI Intolerance    Tramadol    Codeine Nausea And Vomiting   Gabapentin Rash   Penicillins Rash    Review of Systems  Constitutional:  Negative for chills and fever.  HENT:  Positive for congestion. Negative for sore throat.   Eyes:  Negative for pain and discharge.  Respiratory:  Positive for cough, shortness of breath and wheezing.   Cardiovascular:  Negative for chest pain and palpitations.  Gastrointestinal:  Negative for abdominal pain, constipation, diarrhea, nausea and vomiting.  Endocrine: Negative for polydipsia and polyuria.  Genitourinary:  Negative for dysuria and  hematuria.  Musculoskeletal:  Negative for neck pain and neck stiffness.  Skin:  Negative for rash.  Neurological:  Negative for dizziness and weakness.  Psychiatric/Behavioral:  Negative for agitation and behavioral problems.        Objective:    Physical Exam Vitals reviewed.  Constitutional:      General: She is not in acute distress.    Appearance: She is obese. She is not diaphoretic.  HENT:     Head: Normocephalic and atraumatic.  Nose: Nose normal.     Mouth/Throat:     Mouth: Mucous membranes are moist.     Pharynx: No posterior oropharyngeal erythema.  Eyes:     General: No scleral icterus.    Extraocular Movements: Extraocular movements intact.  Cardiovascular:     Rate and Rhythm: Normal rate and regular rhythm.     Heart sounds: Normal heart sounds. No murmur heard. Pulmonary:     Breath sounds: Wheezing (Mild, diffuse) present. No rales.  Musculoskeletal:     Cervical back: Neck supple. No tenderness.     Right lower leg: No edema.     Left lower leg: No edema.  Skin:    General: Skin is warm.     Findings: No rash.  Neurological:     General: No focal deficit present.     Mental Status: She is alert and oriented to person, place, and time.  Psychiatric:        Mood and Affect: Mood normal.        Behavior: Behavior normal.     BP 102/61   Pulse 63   Ht 5\' 8"  (1.727 m)   Wt (!) 316 lb 9.6 oz (143.6 kg)   SpO2 98%   BMI 48.14 kg/m  Wt Readings from Last 3 Encounters:  09/01/23 (!) 316 lb 9.6 oz (143.6 kg)  08/03/23 (!) 312 lb (141.5 kg)  07/08/23 (!) 318 lb (144.2 kg)        Assessment & Plan:   Problem List Items Addressed This Visit       Respiratory   Acute bronchitis - Primary   Started empiric azithromycin considering persistent symptoms despite symptomatic treatment Depo-Medrol 80 mg IM today Albuterol inhaler as needed for dyspnea or wheezing Continue promethazine-DM syrup as needed for cough Check CXR      Relevant  Medications   azithromycin (ZITHROMAX) 250 MG tablet   promethazine-dextromethorphan (PROMETHAZINE-DM) 6.25-15 MG/5ML syrup   albuterol (VENTOLIN HFA) 108 (90 Base) MCG/ACT inhaler   Other Relevant Orders   DG Chest 2 View     Meds ordered this encounter  Medications   azithromycin (ZITHROMAX) 250 MG tablet    Sig: Take 2 tablets on day 1, then 1 tablet daily on days 2 through 5    Dispense:  6 tablet    Refill:  0   promethazine-dextromethorphan (PROMETHAZINE-DM) 6.25-15 MG/5ML syrup    Sig: Take 5 mLs by mouth 4 (four) times daily as needed for cough.    Dispense:  118 mL    Refill:  0   albuterol (VENTOLIN HFA) 108 (90 Base) MCG/ACT inhaler    Sig: Inhale 2 puffs into the lungs every 6 (six) hours as needed for wheezing or shortness of breath.    Dispense:  8 g    Refill:  0    Okay to substitute to generic/formulary Albuterol.   methylPREDNISolone acetate (DEPO-MEDROL) injection 80 mg     Anabel Halon, MD

## 2023-09-01 NOTE — Assessment & Plan Note (Addendum)
 Started empiric azithromycin considering persistent symptoms despite symptomatic treatment Depo-Medrol 80 mg IM today Albuterol inhaler as needed for dyspnea or wheezing Continue promethazine-DM syrup as needed for cough Check CXR

## 2023-09-16 ENCOUNTER — Encounter: Payer: Self-pay | Admitting: Podiatry

## 2023-09-16 ENCOUNTER — Ambulatory Visit (INDEPENDENT_AMBULATORY_CARE_PROVIDER_SITE_OTHER): Admitting: Podiatry

## 2023-09-16 DIAGNOSIS — B351 Tinea unguium: Secondary | ICD-10-CM | POA: Diagnosis not present

## 2023-09-16 DIAGNOSIS — L600 Ingrowing nail: Secondary | ICD-10-CM

## 2023-09-16 NOTE — Progress Notes (Signed)
       Subjective:  Patient ID: Shannon Roth, female    DOB: 14-Sep-1965,  MRN: [id number]  Chief Complaint  Patient presents with   Nail Problem    " I had some white stuff come out of the toe where he did the procedure this morning,     [name] presents to clinic today for f/u of PNA to the left hallux lateral border.  She is doing well with this.  Denies pain.  She does have some callus along the nail fold in this area.  She is also wanting to discuss treatment for fungal toenails.  PCP is Towanda Fret, MD.  Allergies  Allergen Reactions   Penicillin G Other (See Comments)    GI Intolerance    Tramadol    Codeine Nausea And Vomiting   Gabapentin Rash   Penicillins Rash    Objective:  There were no vitals filed for this visit.  Vascular Examination: Capillary refill time is less than 3 seconds to toes bilateral. Palpable pedal pulses b/l LE. Digital hair present b/l. No pedal edema b/l. Skin temperature gradient WNL b/l. No varicosities b/l. No cyanosis or clubbing noted b/l.   Dermatological Examination: Upon inspection of the PNA site, there are no clinical signs of infection.  No purulence, no necrosis, no malodor present.  Minimal to no erythema present.  Eschar formed along nail margin.  Minimal to no pain on palpation of area.  Some painful callus present to left hallux lateral nail fold  Left 1st and 2nd toenails are discolored, dystrophic with some subungual debris.  Consistent with mycotic appearance.  Assessment/Plan: 1. Onychomycosis of left great toe   2. Ingrown toenail of left foot     No orders of the defined types were placed in this encounter.  PNA site left hallux healing well.  Painful callus left hallux lateral nail border palliatively debrided as a courtesy without incident.  Patient interested in treatment for onychomycosis.  Nail specimen obtained from left first toenail.  Should have has fatty liver and would be potentially interested  in topical treatment.  Return in about 4 weeks (around 10/14/2023) for Nail pathology.   Hildred Pharo L. Lunda Salines, AACFAS [facility]     [address] [place], [place] [address]                Office [phone]  Fax [phone]

## 2023-09-24 ENCOUNTER — Other Ambulatory Visit: Payer: Self-pay | Admitting: Family Medicine

## 2023-10-05 ENCOUNTER — Other Ambulatory Visit: Payer: Self-pay | Admitting: Podiatry

## 2023-10-14 ENCOUNTER — Ambulatory Visit: Admitting: Podiatry

## 2023-10-28 ENCOUNTER — Other Ambulatory Visit: Payer: Self-pay | Admitting: Family Medicine

## 2023-11-03 ENCOUNTER — Other Ambulatory Visit: Payer: Self-pay | Admitting: Family Medicine

## 2023-11-03 ENCOUNTER — Ambulatory Visit: Payer: Self-pay

## 2023-11-03 MED ORDER — IBUPROFEN 800 MG PO TABS: ORAL_TABLET | ORAL | 0 refills | Status: DC

## 2023-11-03 NOTE — Telephone Encounter (Signed)
 Copied from CRM [id number]. Topic: Clinical - Medication Refill >> Nov 03, 2023  2:24 PM Carlatta H wrote: Medication: ibuprofen  (ADVIL ) 800 MG tablet [[id number]  Has the patient contacted their pharmacy? No (Agent: If no, request that the patient contact the pharmacy for the refill. If patient does not wish to contact the pharmacy document the reason why and proceed with request.) (Agent: If yes, when and what did the pharmacy advise?)  This is the patient's preferred pharmacy:  [facility] [phone] - [place], [place] - [address] AT [facility] [address] [place] [address] Phone: [phone] Fax: [phone]  Is this the correct pharmacy for this prescription? Yes If no, delete pharmacy and type the correct one.   Has the prescription been filled recently? No  Is the patient out of the medication? Yes  Has the patient been seen for an appointment in the last year OR does the patient have an upcoming appointment? Yes  Can we respond through MyChart? No  Agent: Please be advised that Rx refills may take up to 3 business days. We ask that you follow-up with your pharmacy.

## 2023-11-03 NOTE — Telephone Encounter (Signed)
 Copied from CRM [id number]. Topic: Clinical - Medication Question >> Nov 03, 2023  4:24 PM Tiffany S wrote: Reason for CRM: ibuprofen  (ADVIL ) 800 MG tablet [[id number]] Pharmacy need clarification on medication  [id number]

## 2023-11-03 NOTE — Telephone Encounter (Signed)
 Tried to speak to pharmacy, on hold 15 mins and hung up , refill request came in and was refilled like previous directions

## 2023-11-04 DIAGNOSIS — Z0289 Encounter for other administrative examinations: Secondary | ICD-10-CM

## 2023-11-11 ENCOUNTER — Encounter: Payer: Self-pay | Admitting: Podiatry

## 2023-11-11 ENCOUNTER — Ambulatory Visit (INDEPENDENT_AMBULATORY_CARE_PROVIDER_SITE_OTHER): Admitting: Podiatry

## 2023-11-11 VITALS — Resp 16 | Ht 68.0 in | Wt 316.0 lb

## 2023-11-11 DIAGNOSIS — B351 Tinea unguium: Secondary | ICD-10-CM | POA: Diagnosis not present

## 2023-11-11 MED ORDER — CICLOPIROX 8 % EX SOLN: Freq: Every day | CUTANEOUS | 12 refills | Status: DC

## 2023-11-11 NOTE — Progress Notes (Signed)
       Subjective:  Patient ID: Shannon Roth, female    DOB: December 31, 1965,  MRN: [id number]  Chief Complaint  Patient presents with   Nail Problem    " She is here for her nail results on the left foot"    Shannon Roth presents today to review nail pathology to left foot toenails.  She is interested in starting treatment for onychomycosis.  PCP is Towanda Fret, MD.  Allergies  Allergen Reactions   Penicillin G Other (See Comments)    GI Intolerance    Tramadol     Codeine Nausea And Vomiting   Gabapentin Rash   Penicillins Rash    Objective:  Vitals:   11/11/23 1058  Resp: 16    Vascular Examination: Capillary refill time is less than 3 seconds to toes bilateral. Palpable pedal pulses b/l LE. Digital hair present b/l. No pedal edema b/l. Skin temperature gradient WNL b/l. No varicosities b/l. No cyanosis or clubbing noted b/l.   Dermatological Examination: Left foot 1st, 2nd and 5th toenails most affected.  There is discoloration with some nail thickening present.  Approximately 50 to 65% of the nail plate surfaces are affected.  Consistent with mycotic appearance.  Assessment/Plan: 1. Onychomycosis of toenail     Meds ordered this encounter  Medications   ciclopirox  (PENLAC ) 8 % solution    Sig: Apply topically at bedtime. Apply over nail and surrounding skin. Apply daily over previous coat. After seven (7) days, may remove with alcohol and continue cycle.    Dispense:  6.6 mL    Refill:  12   Nail specimens reviewed with patient, positive for onychomycosis with dermatophyte growth.  Patient has history of fatty liver, we will proceed with topical treatment.  Prescription for Penlac  sent to patient's pharmacy.  Discussed proper application instructions with weekly needs to remove the medication.  Return in about 3 months (around 02/11/2024) for Onychomycosis.   Shannon Roth, AACFAS [facility]     [address] [place], [place] [address]                Office [phone]  Fax [phone]

## 2023-11-14 ENCOUNTER — Encounter: Payer: Self-pay | Admitting: Podiatry

## 2023-12-01 ENCOUNTER — Ambulatory Visit (INDEPENDENT_AMBULATORY_CARE_PROVIDER_SITE_OTHER): Payer: Commercial Managed Care - PPO | Admitting: Family Medicine

## 2023-12-01 ENCOUNTER — Encounter: Payer: Self-pay | Admitting: Family Medicine

## 2023-12-01 VITALS — BP 120/78 | HR 72 | Resp 16 | Ht 68.0 in | Wt 320.1 lb

## 2023-12-01 DIAGNOSIS — I10 Essential (primary) hypertension: Secondary | ICD-10-CM | POA: Diagnosis not present

## 2023-12-01 DIAGNOSIS — R5383 Other fatigue: Secondary | ICD-10-CM | POA: Diagnosis not present

## 2023-12-01 DIAGNOSIS — E785 Hyperlipidemia, unspecified: Secondary | ICD-10-CM

## 2023-12-01 DIAGNOSIS — Z131 Encounter for screening for diabetes mellitus: Secondary | ICD-10-CM

## 2023-12-01 DIAGNOSIS — R252 Cramp and spasm: Secondary | ICD-10-CM

## 2023-12-01 DIAGNOSIS — R7989 Other specified abnormal findings of blood chemistry: Secondary | ICD-10-CM

## 2023-12-01 MED ORDER — CYCLOBENZAPRINE HCL 10 MG PO TABS: 10.0000 mg | ORAL_TABLET | Freq: Every day | ORAL | 1 refills | Status: AC

## 2023-12-01 NOTE — Progress Notes (Signed)
 Shannon Roth     MRN: [id number]      DOB: 1965/10/13  Chief Complaint  Patient presents with   Hypertension    4 month follow up    Dysmenorrhea    Still having ongoing cramping in both legs     HPI Shannon Roth is here for follow up and re-evaluation of chronic medical conditions, medication management and review of any available recent lab and radiology data.  Preventive health is updated, specifically  Cancer screening and Immunization.   Questions or concerns regarding consultations or procedures which the PT has had in the interim are  addressed. The PT denies any adverse reactions to current medications since the last visit.  There are no new concerns.  There are no specific complaints   ROS Denies recent fever or chills. Denies sinus pressure, nasal congestion, ear pain or sore throat. Denies chest congestion, productive cough or wheezing. Denies chest pains, palpitations and leg swelling Denies abdominal pain, nausea, vomiting,diarrhea or constipation.   Denies dysuria, frequency, hesitancy or incontinence. Denies joint pain, swelling and limitation in mobility. Denies headaches, seizures, numbness, or tingling. Denies depression, anxiety or insomnia. Denies skin break down or rash.   PE  BP 120/78   Pulse 72   Resp 16   Ht 5' 8 (1.727 m)   Wt (!) 320 lb 1.3 oz (145.2 kg)   SpO2 96%   BMI 48.67 kg/m   Patient alert and oriented and in no cardiopulmonary distress.  HEENT: No facial asymmetry, EOMI,     Neck supple .  Chest: Clear to auscultation bilaterally.  CVS: S1, S2 no murmurs, no S3.Regular rate.  ABD: Soft non tender.   Ext: No edema  MS: Adequate ROM spine, shoulders, hips and knees.  Skin: Intact, no ulcerations or rash noted.  Psych: Good eye contact, normal affect. Memory intact not anxious or depressed appearing.  CNS: CN 2-12 intact, power,  normal throughout.no focal deficits noted.   Assessment & Plan  Essential  hypertension Controlled, no change in medication DASH diet and commitment to daily physical activity for a minimum of 30 minutes discussed and encouraged, as a part of hypertension management. The importance of attaining a healthy weight is also discussed.     12/01/2023    8:41 AM 12/01/2023    8:12 AM 11/11/2023   10:58 AM 09/01/2023    2:35 PM 08/03/2023    8:10 AM 07/08/2023   11:58 AM 07/08/2023   11:00 AM  BP/Weight  Systolic BP 120 147  102 110 164 149  Diastolic BP 78 78  61 80 80 83  Wt. (Lbs)  320.08 316 316.6 312  318  BMI  48.67 kg/m2 48.05 kg/m2 48.14 kg/m2 47.44 kg/m2  51.33 kg/m2       Cramps of lower extremity Uncontrolled , bedtime flexeril  prescribed  Morbid obesity  Patient re-educated about  the importance of commitment to a  minimum of 150 minutes of exercise per week as able.  The importance of healthy food choices with portion control discussed, as well as eating regularly and within a 12 hour window most days. The need to choose clean , green food 50 to 75% of the time is discussed, as well as to make water  the primary drink and set a goal of 64 ounces water  daily.       12/01/2023    8:12 AM 11/11/2023   10:58 AM 09/01/2023    2:35 PM  Weight /BMI  Weight  320 lb 1.3 oz 316 lb 316 lb 9.6 oz  Height 5' 8 (1.727 m) 5' 8 (1.727 m) 5' 8 (1.727 m)  BMI 48.67 kg/m2 48.05 kg/m2 48.14 kg/m2    To check on coverage of meds and get backl in touch Surgery recommended  Migraines Controlled   Low vitamin D  level Corrected , 1000 international units  daily recommend  Fatigue Check iron, TSH and screen for diabetes  Screening for diabetes mellitus F/h of diabetes, check HBA1C  Dyslipidemia Hyperlipidemia:Low fat diet discussed and encouraged.   Lipid Panel  Lab Results  Component Value Date   CHOL 180 12/01/2023   HDL 43 12/01/2023   LDLCALC 119 (H) 12/01/2023   TRIG 100 12/01/2023   CHOLHDL 4.2 12/01/2023     Needs to reduce fat  intake Flexeril  10

## 2023-12-01 NOTE — Patient Instructions (Signed)
 Annual exam 10/10 or after ,  call if you  need me sooner  Pls help patient to log into My Chart at checkout  Pls schedule mammogram at checkout  Take calcium with D one twice daily for bone and muuscle health  Cyclobenzaprine  10 mg 1 at bedtime is prescribed for muscle spasm.If not effective I will refer you to Neurology, pls send message if needed   Please contact your insurance agency to see if Zepbound will be covered if you have sleep apnea and morbidly obese this is for weight loss.  If not covered I highly recommend that you consider bariatric surgery.  Please let me know if you decide on one route route or the other prior to your next appointment so I can proceed with appropriate prescription or referral.  Hepatitis B vaccine is recommended let us  know when you decide to have the series.  Fasting CBC lipid CMP and EGFR, magnesium HbA1c and TSH and B12  today.  Thanks for choosing [facility], we consider it a privelige to serve you.

## 2023-12-02 ENCOUNTER — Ambulatory Visit: Payer: Self-pay | Admitting: Family Medicine

## 2023-12-02 ENCOUNTER — Other Ambulatory Visit: Payer: Self-pay

## 2023-12-02 DIAGNOSIS — D619 Aplastic anemia, unspecified: Secondary | ICD-10-CM

## 2023-12-02 LAB — LIPID PANEL
Chol/HDL Ratio: 4.2 ratio (ref 0.0–4.4)
Cholesterol, Total: 180 mg/dL (ref 100–199)
HDL: 43 mg/dL (ref >39)
LDL Chol Calc (NIH): 119 mg/dL — ABNORMAL HIGH (ref 0–99)
Triglycerides: 100 mg/dL (ref 0–149)
VLDL Cholesterol Cal: 18 mg/dL (ref 5–40)

## 2023-12-02 LAB — CMP14+EGFR
ALT: 15 IU/L (ref 0–32)
AST: 16 IU/L (ref 0–40)
Albumin: 4.2 g/dL (ref 3.8–4.9)
Alkaline Phosphatase: 93 IU/L (ref 44–121)
BUN/Creatinine Ratio: 14 (ref 9–23)
BUN: 13 mg/dL (ref 6–24)
Bilirubin Total: 0.3 mg/dL (ref 0.0–1.2)
CO2: 21 mmol/L (ref 20–29)
Calcium: 9.6 mg/dL (ref 8.7–10.2)
Chloride: 103 mmol/L (ref 96–106)
Creatinine, Ser: 0.9 mg/dL (ref 0.57–1.00)
Globulin, Total: 2.9 g/dL (ref 1.5–4.5)
Glucose: 80 mg/dL (ref 70–99)
Potassium: 4.2 mmol/L (ref 3.5–5.2)
Sodium: 141 mmol/L (ref 134–144)
Total Protein: 7.1 g/dL (ref 6.0–8.5)
eGFR: 74 mL/min/{1.73_m2} (ref >59)

## 2023-12-02 LAB — VITAMIN B12: Vitamin B-12: 1193 pg/mL (ref 232–1245)

## 2023-12-02 LAB — MAGNESIUM: Magnesium: 2.1 mg/dL (ref 1.6–2.3)

## 2023-12-02 LAB — CBC WITH DIFFERENTIAL/PLATELET
Basophils Absolute: 0 10*3/uL (ref 0.0–0.2)
Basos: 0 %
EOS (ABSOLUTE): 0.1 10*3/uL (ref 0.0–0.4)
Eos: 1 %
Hematocrit: 35.7 % (ref 34.0–46.6)
Hemoglobin: 10.1 g/dL — ABNORMAL LOW (ref 11.1–15.9)
Immature Grans (Abs): 0 10*3/uL (ref 0.0–0.1)
Immature Granulocytes: 0 %
Lymphocytes Absolute: 2.3 10*3/uL (ref 0.7–3.1)
Lymphs: 28 %
MCH: 23.1 pg — ABNORMAL LOW (ref 26.6–33.0)
MCHC: 28.3 g/dL — ABNORMAL LOW (ref 31.5–35.7)
MCV: 82 fL (ref 79–97)
Monocytes Absolute: 0.7 10*3/uL (ref 0.1–0.9)
Monocytes: 8 %
Neutrophils Absolute: 5.1 10*3/uL (ref 1.4–7.0)
Neutrophils: 62 %
Platelets: 325 10*3/uL (ref 150–450)
RBC: 4.38 x10E6/uL (ref 3.77–5.28)
RDW: 12.9 % (ref 11.7–15.4)
WBC: 8.2 10*3/uL (ref 3.4–10.8)

## 2023-12-02 LAB — TSH: TSH: 1.25 u[IU]/mL (ref 0.450–4.500)

## 2023-12-02 LAB — HEMOGLOBIN A1C
Est. average glucose Bld gHb Est-mCnc: 108 mg/dL
Hgb A1c MFr Bld: 5.4 % (ref 4.8–5.6)

## 2023-12-06 ENCOUNTER — Encounter: Payer: Self-pay | Admitting: Family Medicine

## 2023-12-06 DIAGNOSIS — Z131 Encounter for screening for diabetes mellitus: Secondary | ICD-10-CM | POA: Insufficient documentation

## 2023-12-06 LAB — SPECIMEN STATUS REPORT

## 2023-12-06 LAB — IRON: Iron: 65 ug/dL (ref 27–159)

## 2023-12-06 NOTE — Assessment & Plan Note (Signed)
 F/h of diabetes, check HBA1C

## 2023-12-06 NOTE — Assessment & Plan Note (Signed)
 Controlled.

## 2023-12-06 NOTE — Assessment & Plan Note (Signed)
 Corrected , 1000 international units  daily recommend

## 2023-12-06 NOTE — Assessment & Plan Note (Signed)
 Uncontrolled , bedtime flexeril  prescribed

## 2023-12-06 NOTE — Assessment & Plan Note (Signed)
  Patient re-educated about  the importance of commitment to a  minimum of 150 minutes of exercise per week as able.  The importance of healthy food choices with portion control discussed, as well as eating regularly and within a 12 hour window most days. The need to choose clean , green food 50 to 75% of the time is discussed, as well as to make water  the primary drink and set a goal of 64 ounces water  daily.       12/01/2023    8:12 AM 11/11/2023   10:58 AM 09/01/2023    2:35 PM  Weight /BMI  Weight 320 lb 1.3 oz 316 lb 316 lb 9.6 oz  Height 5' 8 (1.727 m) 5' 8 (1.727 m) 5' 8 (1.727 m)  BMI 48.67 kg/m2 48.05 kg/m2 48.14 kg/m2    To check on coverage of meds and get backl in touch Surgery recommended

## 2023-12-06 NOTE — Assessment & Plan Note (Signed)
 Check iron, TSH and screen for diabetes

## 2023-12-06 NOTE — Assessment & Plan Note (Signed)
 Hyperlipidemia:Low fat diet discussed and encouraged.   Lipid Panel  Lab Results  Component Value Date   CHOL 180 12/01/2023   HDL 43 12/01/2023   LDLCALC 119 (H) 12/01/2023   TRIG 100 12/01/2023   CHOLHDL 4.2 12/01/2023     Needs to reduce fat intake

## 2023-12-06 NOTE — Assessment & Plan Note (Signed)
 Controlled, no change in medication DASH diet and commitment to daily physical activity for a minimum of 30 minutes discussed and encouraged, as a part of hypertension management. The importance of attaining a healthy weight is also discussed.     12/01/2023    8:41 AM 12/01/2023    8:12 AM 11/11/2023   10:58 AM 09/01/2023    2:35 PM 08/03/2023    8:10 AM 07/08/2023   11:58 AM 07/08/2023   11:00 AM  BP/Weight  Systolic BP 120 147  102 110 164 149  Diastolic BP 78 78  61 80 80 83  Wt. (Lbs)  320.08 316 316.6 312  318  BMI  48.67 kg/m2 48.05 kg/m2 48.14 kg/m2 47.44 kg/m2  51.33 kg/m2

## 2023-12-07 ENCOUNTER — Encounter (INDEPENDENT_AMBULATORY_CARE_PROVIDER_SITE_OTHER): Payer: Self-pay

## 2023-12-15 ENCOUNTER — Other Ambulatory Visit: Payer: Self-pay | Admitting: Family Medicine

## 2023-12-30 ENCOUNTER — Ambulatory Visit (INDEPENDENT_AMBULATORY_CARE_PROVIDER_SITE_OTHER): Admitting: Podiatry

## 2023-12-30 ENCOUNTER — Ambulatory Visit (INDEPENDENT_AMBULATORY_CARE_PROVIDER_SITE_OTHER)

## 2023-12-30 ENCOUNTER — Encounter: Payer: Self-pay | Admitting: Podiatry

## 2023-12-30 VITALS — Ht 68.0 in | Wt 320.1 lb

## 2023-12-30 DIAGNOSIS — G5752 Tarsal tunnel syndrome, left lower limb: Secondary | ICD-10-CM

## 2023-12-30 DIAGNOSIS — M722 Plantar fascial fibromatosis: Secondary | ICD-10-CM

## 2023-12-30 MED ORDER — TRIAMCINOLONE ACETONIDE 10 MG/ML IJ SUSP
10.0000 mg | Freq: Once | INTRAMUSCULAR | Status: AC
  Administered 2023-12-30: 10 mg via INTRA_ARTICULAR

## 2023-12-30 MED ORDER — DICLOFENAC SODIUM 75 MG PO TBEC
75.0000 mg | DELAYED_RELEASE_TABLET | Freq: Two times a day (BID) | ORAL | 2 refills | Status: DC
Start: 2023-12-30 — End: 2024-01-05

## 2023-12-30 NOTE — Progress Notes (Signed)
 Subjective:   Patient ID: Shannon Roth, female   DOB: 58 y.o.   MRN: [id number]   HPI Patient states she has been having severe pain in her left foot for the last few days and states that she was on her foot for an extended period of time approximately 4 days ago and it started to hurt   ROS      Objective:  Physical Exam  Neurovascular status intact with inflammation around the posterior tibial tendon left as it comes underneath the medial malleolus with soreness upon pressure to the area and moderate depression of the arch noted     Assessment:  Acute posterior tibial tendinitis left     Plan:  H&P x-ray reviewed and I went ahead and carefully injected the sheath of the posterior tibial tendon left 3 mg Dexasone Kenalog  5 mg Xylocaine  patient has an air fracture walker that I want her to start wearing and she will wear that full-time and be seen back in approximately 3 weeks  X-rays indicate that there is moderate collapse medial longitudinal arch left with no other signs of significant pathology for stress fracture

## 2024-01-03 ENCOUNTER — Encounter (INDEPENDENT_AMBULATORY_CARE_PROVIDER_SITE_OTHER): Payer: Self-pay | Admitting: Family Medicine

## 2024-01-03 ENCOUNTER — Other Ambulatory Visit (INDEPENDENT_AMBULATORY_CARE_PROVIDER_SITE_OTHER): Payer: Self-pay | Admitting: Family Medicine

## 2024-01-03 ENCOUNTER — Other Ambulatory Visit: Payer: Self-pay | Admitting: Family Medicine

## 2024-01-03 ENCOUNTER — Ambulatory Visit (INDEPENDENT_AMBULATORY_CARE_PROVIDER_SITE_OTHER): Admitting: Family Medicine

## 2024-01-03 VITALS — BP 131/82 | HR 89 | Temp 98.7°F | Ht 67.0 in | Wt 315.0 lb

## 2024-01-03 DIAGNOSIS — R0602 Shortness of breath: Secondary | ICD-10-CM

## 2024-01-03 DIAGNOSIS — E559 Vitamin D deficiency, unspecified: Secondary | ICD-10-CM

## 2024-01-03 DIAGNOSIS — M170 Bilateral primary osteoarthritis of knee: Secondary | ICD-10-CM

## 2024-01-03 DIAGNOSIS — K760 Fatty (change of) liver, not elsewhere classified: Secondary | ICD-10-CM

## 2024-01-03 DIAGNOSIS — Z1331 Encounter for screening for depression: Secondary | ICD-10-CM | POA: Diagnosis not present

## 2024-01-03 DIAGNOSIS — R5383 Other fatigue: Secondary | ICD-10-CM

## 2024-01-03 DIAGNOSIS — Z6841 Body Mass Index (BMI) 40.0 and over, adult: Secondary | ICD-10-CM

## 2024-01-03 DIAGNOSIS — I10 Essential (primary) hypertension: Secondary | ICD-10-CM | POA: Diagnosis not present

## 2024-01-03 DIAGNOSIS — K219 Gastro-esophageal reflux disease without esophagitis: Secondary | ICD-10-CM

## 2024-01-03 NOTE — Progress Notes (Signed)
 Shannon Roth, D.O.  ABFM, ABOM Specializing in Clinical Bariatric Medicine Office located at: [address] [place], [place]  [address]    Bariatric Medicine Visit  Dear Shannon Rollene BRAVO, MD   Thank you for referring Shannon Roth to our clinic today for evaluation.  We performed a consultation to discuss her options for treatment and educate the patient on her disease state.  The following note includes my evaluation and treatment recommendations.   Please do not hesitate to reach out to me directly if you have any further concerns.   Assessment and Plan:   Orders Placed This Encounter  Procedures   VITAMIN D  25 Hydroxy (Vit-D Deficiency, Fractures)   T4, free   Insulin , random   Folate   Vitamin B12   EKG 12-Lead    FOR THE DISEASE OF Shannon:  BMI 45.0-49.9, adult (HCC) current 49.34 Starting Morbid Shannon (HCC) current 49.34 Assessment & Plan: Shannon Roth is currently in the action stage of change. As such, her goal is to start our weight management plan.  She has agreed to implement the Category 2 eating plan with Breakfast and Lunch options.    Behavioral Intervention We discussed the following Behavioral Modification Strategies today: begin to work on maintaining a reduced calorie state, getting the recommended amount of protein, incorporating whole foods, making healthy choices, staying well hydrated and practicing mindfulness when eating.  Additional resources provided today: Handout on Examples of Low Glycemic Index and Low Calorie Fruits & Vegetables, Handout on CAT 2 meal plan, Handout on CAT 1-2 breakfast options, and Handout on CAT 1-2 lunch options  Evidence-based interventions for health behavior change were utilized today including the discussion of self monitoring techniques, problem-solving barriers and SMART goal setting techniques.    Pt will specifically work on avoiding skipping meals and measuring her protein/veggie intake.     Recommended Physical Activity Goals Shannon Roth has been advised to work up to 150 minutes of moderate intensity aerobic activity a week and strengthening exercises 2-3 times per week for cardiovascular health, weight loss maintenance and preservation of muscle mass.   She has agreed to : maintain current level of activity.    Pharmacotherapy In the past, she was on Rybelsus  for 1-2 months. Unfortunately, she stopped taking it due to cost. She did not mention whether it worked well for her.    ASSOCIATED CONDITIONS ADDRESSED TODAY:   Fatigue Assessment & Plan: Shannon Roth does feel that her weight is causing her energy to be lower than it should be. Fatigue may be related to Shannon, depression or many other causes. she does not appear to have any red flag symptoms and this appears to most likely be related to her current lifestyle habits and dietary intake.  Labs will be ordered and reviewed with her at their next office visit in two weeks.  Epworth sleepiness scale is 0. Shannon Roth admits to some daytime somnolence and admits to waking up still tired. Shannon Roth generally gets 5 hours of sleep per night, and states that she has generally restful sleep. Snoring is present. Apneic episodes are not present.   Of note, she mentions having a overnight sleep study in [place] with Dr.Simpson in the past but there is no evidence of this in her EHR.  Encouraged pt to aim for 7-9 hrs of sleep per night for the best weight loss results and for her overall health/well being.   ECG: Performed and reviewed/ interpreted independently.  Sinus bradycardia, rate 51 bpm;  reassuring without any acute abnormalities. Also reviewed ECG dated 11/2021 and it was completely unchanged from today.     Shortness of breath on exertion Assessment & Plan: Shannon Roth does feel that she gets out of breath more easily than she used to when she exercises and seems to be worsening over time with weight gain.  This has gotten worse  recently. Shannon Roth denies shortness of breath at rest or orthopnea. Pt denies chest pain, dizziness, heart palpitations, or excessive diaphoresis or nausea with activity.  This is not new and is ongoing.  Shannon Roth's shortness of breath appears to be Shannon related and exercise induced, as they do not appear to have any red flag symptoms/ concerns today.  Also, this condition appears to be related to a state of poor cardiovascular conditioning   Obtain labs today and will be reviewed with her at their next office visit in two weeks.  Indirect Calorimeter completed today to help guide our dietary regimen. It shows a VO2 of 279 and a REE of 1930.  Her calculated basal metabolic rate is 7897 thus her resting energy expenditure is worse than expected.  Patient agreed to work on weight loss at this time.  As [name] progresses through our weight loss program, we will gradually increase exercise as tolerated to treat her current condition.   If [name] follows our recommendations and loses 5-10% of their weight without improvement of her shortness of breath or if at any time, symptoms become more concerning, they agree to urgently follow up with their PCP/ specialist for further consideration/ evaluation.   Shannon Roth verbalizes agreement with this plan.     Depression Screen  Assessment & Plan: Her PHQ-9 score was 4. Denies any SI/HI. Mood is stable. Upon interview of patient, she has a h/o emotional eating tendencies but she does not feel it is an active issue.   - Begin prudent nutritional plan. - We will begin to monitor this closely and work on CBT to help improve any future non-hunger eating patterns.  - Informed patient about our bariatric psychology provider.     Osteoarthritis of both knees, unspecified osteoarthritis type Assessment & Plan: She has a history of bilateral knee pain. She is eager to lose 75 lbs to be eligible for knee replacement.   - Starting goal: Lose 7-10% of weight via  initiating prudent nutritional plan - Will have her starting exercising in future visits.     Metabolic dysfunction-associated steatotic liver disease (MASLD) Assessment & Plan: MRI of abdomen dated 12/2015 demonstrated hepatomegaly and mild hepatic steatosis.   - Pt educated on disease state.  - Discussed potential risk of progression to liver cirrhosis if condition is untreated.  -Starting goal: Lose 7-10% of weight via prudent nutritional plan and lifestyle changes.      Essential hypertension Assessment & Plan: Last 3 blood pressure readings in our office are as follows: BP Readings from Last 3 Encounters:  01/03/24 131/82  12/01/23 120/78  09/01/23 102/61   The 10-year ASCVD risk score (Arnett DK, et al., 2019) is: 7%  Lab Results  Component Value Date   CREATININE 0.90 12/01/2023   She has a history of HTN which was diagnosed at 58 years old. Reports compliance with Olmesartan  20 mg daily and Spironolactone  100 mg daily. Pt stable today; pt asx. Pt not checking BP at home.   - Goal BP: 130/80 or less on a regular basis.  - Begin to work on nutrition plan to promote weight loss and improve  BP control.     Vitamin D  deficiency Assessment & Plan: Lab Results  Component Value Date   VD25OH 31.6 08/03/2023   VD25OH 18.7 (L) 12/23/2022   VD25OH 12.4 (L) 05/13/2022   She has a history of hypovitaminosis D. She was on OTC Vit D 1,000 units daily per her PCP but tells me that her PCP discontinued it back in late June.   - Will recheck Vit D levels today and provide guidance at next OV.  - weight loss will likely improve availability of vitamin D , thus encouraged Shannon Roth to begin with meal plan and their weight loss efforts to improve this condition.      Gastroesophageal reflux disease without esophagitis Assessment & Plan: GERD managed with Prilosec 20 mg daily. She has no acute concerns.   - Start prudent nutritional plan.  - Losing 7-10% or more of body weight may  improve condition. - Continue medication and f/up with GI as directed.   FOLLOW UP:   Follow up in 2 weeks. She was informed of the importance of frequent follow up visits to maximize her success with intensive lifestyle modifications for her multiple health conditions.  Shannon Roth is aware that we will review all of her lab results at our next visit.  She is aware that if anything is critical/ life threatening with the results, we will be contacting her via MyChart prior to the office visit to discuss management.     Chief Complaint:   Shannon Roth (MR# [id number]) is a pleasant 58 y.o. female who presents for evaluation and treatment of Shannon and related comorbidities. Current BMI is Body mass index is 49.34 kg/m. Shannon Roth has been struggling with her weight for many years and has been unsuccessful in either losing weight, maintaining weight loss, or reaching her healthy weight goal.  Shannon Roth is currently in the action stage of change and ready to dedicate time achieving and maintaining a healthier weight. Shannon Roth is interested in becoming our patient and working on intensive lifestyle modifications including (but not limited to) diet and exercise for weight loss.  Shannon Roth lives a very busy lifestyle. 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 Patient is single. She lives with her 5 year old son, his girlfriend, and her 10 year old grandson.   Heaviest weight: 327 lbs.   Does not engage in formal exercise.   In the past, has tried weight watchers (lost 25 lbs, regained weight) and fasting (lost 10 lbs, regained weight) . Neither plan worked well for her.   Eats fast food or take out 3+ times a week.   Her son and girlfriend do the grocery shopping.   States she is never at home long enough to cook.   The foods she craves depends on her mood.   Snacks on potato chips.   Skips  dinner twice weekly.   Sometimes will drink regular soda 3 times/day. Also has sweet tea with sugar and juice.   Does not use sugar substitutes.   Worst food habit: ice-cream    Subjective:   This is the patient's first visit at [facility].  The patient's NEW PATIENT PACKET that they filled out prior to today's office visit was reviewed at length and information from that paperwork was included within the following office visit note.    Included in the packet: current  and past health history, medications, allergies, ROS, gynecologic history (women only), surgical history, family history, social history, weight history, weight loss surgery history (for those that have had weight loss surgery), nutritional evaluation, mood and food questionnaire along with a depression screening (PHQ9) on all patients, an Epworth questionnaire, sleep habits questionnaire, patient life and health improvement goals questionnaire. These will all be scanned into the patient's chart under the media tab.   Review of Systems: Please refer to new patient packet scanned into media. Pertinent positives were addressed with patient today.  Reviewed by clinician on day of visit: allergies, medications, problem list, medical history, surgical history, family history, social history, and previous encounter notes.  During the visit, I independently reviewed the patient's EKG, bioimpedance scale results, and indirect calorimeter results. I used this information to tailor a meal plan for the patient that will help Shannon Roth to lose weight and will improve her Shannon-related conditions going forward.  I performed a medically necessary appropriate examination and/or evaluation. I discussed the assessment and treatment plan with the patient. The patient was provided an opportunity to ask questions and all were answered. The patient agreed with the plan and demonstrated an understanding of the instructions.  Labs were ordered today (unless patient declined them) and will be reviewed with the patient at our next visit unless more critical results need to be addressed immediately. Clinical information was updated and documented in the EMR.    Objective:   PHYSICAL EXAM: Blood pressure 131/82, pulse 89, temperature 98.7 F (37.1 C), height 5' 7 (1.702 m), weight (!) 315 lb (142.9 kg), SpO2 99%. Body mass index is 49.34 kg/m.  General: Well Developed, well nourished, and in no acute distress.  HEENT: Normocephalic, atraumatic; EOMI, sclerae are anicteric. Skin: Warm and dry, good turgor Chest:  Normal excursion, shape, no gross ABN Respiratory: No conversational dyspnea; speaking in full sentences NeuroM-Sk:  Normal gross ROM * 4 extremities  Psych: A and O *3, insight adequate, mood- full   Anthropometric Measurements Height: 5' 7 (1.702 m) Weight: (!) 315 lb (142.9 kg) BMI (Calculated): 49.32 Weight at Last Visit: 0lb Weight Lost Since Last Visit: 0lb Weight Gained Since Last Visit: 0lb Starting Weight: 315lb Total Weight Loss (lbs): 0 lb (0 kg) Peak Weight: 327lb Waist Measurement : 55 inches   Body Composition  Body Fat %: 54.5 % Fat Mass (lbs): 171.8 lbs Muscle Mass (lbs): 136.4 lbs Total Body Water  (lbs): 97.4 lbs Visceral Fat Rating : 21   Other Clinical Data RMR: 1930 Fasting: Yes Labs: yes Today's Visit #: 1 Starting Date: 01/03/24    DIAGNOSTIC DATA REVIEWED:  BMET    Component Value Date/Time   NA 141 12/01/2023 0911   K 4.2 12/01/2023 0911   CL 103 12/01/2023 0911   CO2 21 12/01/2023 0911   GLUCOSE 80 12/01/2023 0911   GLUCOSE 101 (H) 10/18/2021 0828   BUN 13 12/01/2023 0911   CREATININE 0.90 12/01/2023 0911   CALCIUM 9.6 12/01/2023 0911   GFRNONAA >60 10/18/2021 0828   GFRAA 103 01/08/2020 0919   Lab Results  Component Value Date   HGBA1C 5.4 12/01/2023   HGBA1C 5.6 01/08/2020   No results found for: INSULIN  Lab Results  Component Value  Date   TSH 1.250 12/01/2023   CBC    Component Value Date/Time   WBC 8.2 12/01/2023 0911   WBC 6.5 10/18/2021 0828   RBC 4.38 12/01/2023 0911   RBC 4.75 10/18/2021 0828   HGB  10.1 (L) 12/01/2023 0911   HCT 35.7 12/01/2023 0911   PLT 325 12/01/2023 0911   MCV 82 12/01/2023 0911   MCH 23.1 (L) 12/01/2023 0911   MCH 23.4 (L) 10/18/2021 0828   MCHC 28.3 (L) 12/01/2023 0911   MCHC 30.0 10/18/2021 0828   RDW 12.9 12/01/2023 0911   Iron Studies    Component Value Date/Time   IRON 65 12/01/2023 0911   TIBC 244 (L) 12/03/2017 0727   FERRITIN 467 (H) 01/08/2020 0919   IRONPCTSAT 30 12/03/2017 0727   Lipid Panel     Component Value Date/Time   CHOL 180 12/01/2023 0911   TRIG 100 12/01/2023 0911   HDL 43 12/01/2023 0911   CHOLHDL 4.2 12/01/2023 0911   CHOLHDL 3.7 Ratio 03/09/2009 1850   VLDL 18 03/09/2009 1850   LDLCALC 119 (H) 12/01/2023 0911   Hepatic Function Panel     Component Value Date/Time   PROT 7.1 12/01/2023 0911   ALBUMIN 4.2 12/01/2023 0911   AST 16 12/01/2023 0911   ALT 15 12/01/2023 0911   ALKPHOS 93 12/01/2023 0911   BILITOT 0.3 12/01/2023 0911   BILIDIR 0.1 12/03/2017 0727   IBILI 0.4 12/03/2017 0727      Component Value Date/Time   TSH 1.250 12/01/2023 0911   Nutritional Lab Results  Component Value Date   VD25OH 31.6 08/03/2023   VD25OH 18.7 (L) 12/23/2022   VD25OH 12.4 (L) 05/13/2022    Attestation Statements:   I, Special Puri, acting as a Stage manager for Shannon Jenkins, DO., have compiled all relevant documentation for today's office visit on behalf of Shannon Jenkins, DO, while in the presence of [employer], DO.  I have reviewed the above documentation for accuracy and completeness, and I agree with the above. Shannon JINNY Roth, D.O.  The 21st Century Cures Act was signed into law in 2016 which includes the topic of electronic health records.  This provides immediate access to information in MyChart.  This includes consultation  notes, operative notes, office notes, lab results and pathology reports.  If you have any questions about what you read please let us  know at your next visit so we can discuss your concerns and take corrective action if need be.  We are right here with you.

## 2024-01-04 LAB — FOLATE: Folate: 6.4 ng/mL (ref >3.0)

## 2024-01-04 LAB — T4, FREE: Free T4: 1.27 ng/dL (ref 0.82–1.77)

## 2024-01-04 LAB — INSULIN, RANDOM: INSULIN: 12.3 u[IU]/mL (ref 2.6–24.9)

## 2024-01-04 LAB — VITAMIN B12: Vitamin B-12: 1044 pg/mL (ref 232–1245)

## 2024-01-04 LAB — VITAMIN D 25 HYDROXY (VIT D DEFICIENCY, FRACTURES): Vit D, 25-Hydroxy: 17.6 ng/mL — ABNORMAL LOW (ref 30.0–100.0)

## 2024-01-05 ENCOUNTER — Ambulatory Visit: Payer: Self-pay

## 2024-01-05 ENCOUNTER — Ambulatory Visit (INDEPENDENT_AMBULATORY_CARE_PROVIDER_SITE_OTHER): Admitting: Family Medicine

## 2024-01-05 ENCOUNTER — Encounter: Payer: Self-pay | Admitting: Family Medicine

## 2024-01-05 VITALS — BP 139/84 | HR 99 | Resp 20 | Ht 68.0 in | Wt 315.0 lb

## 2024-01-05 DIAGNOSIS — M170 Bilateral primary osteoarthritis of knee: Secondary | ICD-10-CM | POA: Insufficient documentation

## 2024-01-05 DIAGNOSIS — I10 Essential (primary) hypertension: Secondary | ICD-10-CM | POA: Diagnosis not present

## 2024-01-05 MED ORDER — SPIRONOLACTONE 100 MG PO TABS: 100.0000 mg | ORAL_TABLET | Freq: Every day | ORAL | 11 refills | Status: DC

## 2024-01-05 MED ORDER — KETOROLAC TROMETHAMINE 60 MG/2ML IM SOLN
60.0000 mg | Freq: Once | INTRAMUSCULAR | Status: AC
  Administered 2024-01-05: 60 mg via INTRAMUSCULAR

## 2024-01-05 MED ORDER — ACETAMINOPHEN ER 650 MG PO TBCR: 650.0000 mg | EXTENDED_RELEASE_TABLET | Freq: Three times a day (TID) | ORAL | 1 refills | Status: AC | PRN

## 2024-01-05 MED ORDER — METHYLPREDNISOLONE ACETATE 80 MG/ML IJ SUSP
80.0000 mg | Freq: Once | INTRAMUSCULAR | Status: AC
  Administered 2024-01-05: 80 mg via INTRAMUSCULAR

## 2024-01-05 MED ORDER — PREDNISONE 20 MG PO TABS: 20.0000 mg | ORAL_TABLET | Freq: Two times a day (BID) | ORAL | 0 refills | Status: DC

## 2024-01-05 NOTE — Assessment & Plan Note (Signed)
 Uncontrolled.Toradol  and depo medrol  administered IM in the office , to be followed by a short course of oral prednisone  and  needs to commit to tylenol  650 overy 8 hrs as needed, also weight loss

## 2024-01-05 NOTE — Telephone Encounter (Signed)
 Patient has already been triaged by another RN. Please see triage notes.   Copied from CRM [id number]. Topic: Clinical - Prescription Issue >> Jan 05, 2024 10:38 AM Avram MATSU wrote: Reason for CRM: pt stated her medication ibuprofen  (ADVIL ) 800 MG tablet [[id number]] wont be refilled by the provider. She stated she has knee pain and can't  get surgery until she reach a target rate. Pt would like to know If she can get a different medication.

## 2024-01-05 NOTE — Patient Instructions (Signed)
 Change annual exam to November at checkout please  Injections , Toradol  60 mg and depo medrol  80 mg IM in office today for pain and 5 day course of prednisone  is prescribed, pls start this today  Commit to tylenol  once or twice daily to help with pain  Congrats on weigjht loss , keep itt up, that will help a lot with the arhtritic pain in your knees  Thanks for choosing [facility], we consider it a privelige to serve you.

## 2024-01-05 NOTE — Progress Notes (Signed)
   Shannon Roth     MRN: [id number]      DOB: 04-Feb-1966  Chief Complaint  Patient presents with   Knee Pain    Complains of bilateral knee pain for last couple weeks. Was told by ortho pt needs knee replacement but pt needs to loose weight.     HPI Ms. Shannon Roth is here fwith above comoncern Pain rated at A 10, NO INCITING TRAUMA, unable to afford bariatric surgery currently, pain negatively affecting all aspects of her life currently ROS: Denies sinus pressure, nasal congestion, ear pain or sore throat. Denies chest congestion, productive cough or wheezing. Denies chest pains, palpitations and leg swelling Denies abdominal pain, nausea, vomiting,diarrhea or constipation.   Denies dysuria, frequency, hesitancy or incontinence.  Denies skin break down or rash.   PE  BP 139/84   Pulse 99   Resp 20   Ht 5' 8 (1.727 m)   Wt (!) 315 lb (142.9 kg)   SpO2 96%   BMI 47.90 kg/m   Patient alert and oriented and in no cardiopulmonary distress.  HEENT: No facial asymmetry, EOMI,     Neck supple .  Chest: Clear to auscultation bilaterally.  CVS: S1, S2 no murmurs, no S3.Regular rate.  ABD: Soft non tender.   Ext: No edema  MS: Decreased rom knees.WITH TENDERNESS, CREPITUS AND DEFORMITY  Skin: Intact, no ulcerations or rash noted.  Psych: Good eye contact, normal affect. Memory intact not anxious or depressed appearing.  CNS: CN 2-12 intact, power,  normal throughout.no focal deficits noted.   Assessment & Plan  Osteoarthritis of both knees Uncontrolled.Toradol  and depo medrol  administered IM in the office , to be followed by a short course of oral prednisone  and  needs to commit to tylenol  650 overy 8 hrs as needed, also weight loss  Morbid obesity  Patient re-educated about  the importance of commitment to a  minimum of 150 minutes of exercise per week as able.  The importance of healthy food choices with portion control discussed, as well as eating regularly and  within a 12 hour window most days. The need to choose clean , green food 50 to 75% of the time is discussed, as well as to make water  the primary drink and set a goal of 64 ounces water  daily.       01/05/2024    2:26 PM 01/03/2024    9:00 AM 12/30/2023    8:49 AM  Weight /BMI  Weight 315 lb 315 lb 320 lb 1.3 oz  Height 5' 8 (1.727 m) 5' 7 (1.702 m) 5' 8 (1.727 m)  BMI 47.9 kg/m2 49.34 kg/m2 48.67 kg/m2      Essential hypertension Controlled, no change in medication DASH diet and commitment to daily physical activity for a minimum of 30 minutes discussed and encouraged, as a part of hypertension management. The importance of attaining a healthy weight is also discussed.     01/05/2024    2:26 PM 01/03/2024    9:00 AM 12/30/2023    8:49 AM 12/01/2023    8:41 AM 12/01/2023    8:12 AM 11/11/2023   10:58 AM 09/01/2023    2:35 PM  BP/Weight  Systolic BP 139 131  120 147  897  Diastolic BP 84 82  78 78  61  Wt. (Lbs) 315 315 320.08  320.08 316 316.6  BMI 47.9 kg/m2 49.34 kg/m2 48.67 kg/m2  48.67 kg/m2 48.05 kg/m2 48.14 kg/m2

## 2024-01-05 NOTE — Telephone Encounter (Signed)
 Pt agreeable will be here at 2:20

## 2024-01-05 NOTE — Assessment & Plan Note (Signed)
  Patient re-educated about  the importance of commitment to a  minimum of 150 minutes of exercise per week as able.  The importance of healthy food choices with portion control discussed, as well as eating regularly and within a 12 hour window most days. The need to choose clean , green food 50 to 75% of the time is discussed, as well as to make water  the primary drink and set a goal of 64 ounces water  daily.       01/05/2024    2:26 PM 01/03/2024    9:00 AM 12/30/2023    8:49 AM  Weight /BMI  Weight 315 lb 315 lb 320 lb 1.3 oz  Height 5' 8 (1.727 m) 5' 7 (1.702 m) 5' 8 (1.727 m)  BMI 47.9 kg/m2 49.34 kg/m2 48.67 kg/m2

## 2024-01-05 NOTE — Telephone Encounter (Signed)
 FYI Only or Action Required?: Action required by provider: medication refill request and update on patient condition.  Patient was last seen in primary care on 01/03/2024 by Midge Sober, DO.  Called Nurse Triage reporting Knee Pain.  Symptoms began several months ago.  Interventions attempted: OTC medications: naproxen , ibuprofen , Aspercreme and Ice/heat application.  Symptoms are: bilateral severe knee pain worsens with any movement gradually worsening.  Triage Disposition: See PCP When Office is Open (Within 3 Days)- no available appts til next week, advised urgent care if symptoms worsen and patient declined.  Patient/caregiver understands and will follow disposition?:  No, wishes to speak with PCP                    Copied from CRM #[id number]. Topic: Clinical - Red Word Triage >> Jan 05, 2024 10:40 AM Avram MATSU wrote: Red Word that prompted transfer to Nurse Triage: pt is in extreme pain and cannot move Reason for Disposition  [1] MODERATE pain (e.g., interferes with normal activities, limping) AND [2] present > 3 days  Answer Assessment - Initial Assessment Questions Patient states she needs both knees replaced but she needs to lose weight before the surgery can be done. She states yesterday she requested a refill for her ibuprofen  refill and due to PCP prescribing it as twice a week, it can't be refilled til August. She states she sees orthopedic specialist who gave her an injection earlier this year, she states its $300-400 and is a waste of her money to go back and get the injection.  1. LOCATION and RADIATION: Where is the pain located?      Bilateral knees.  2. QUALITY: What does the pain feel like?  (e.g., sharp, dull, aching, burning)     If I move my pain level is about 25, whether I put my feet on the floor or laying in the bed. Aches, hurts.  3. SEVERITY: How bad is the pain? What does it keep you from doing?   (Scale 1-10; or mild,  moderate, severe)     She states it doesn't change 25/10, severe pain. She states it puts her in tears.  4. ONSET: When did the pain start? Does it come and go, or is it there all the time?     Several months.  5. RECURRENT: Have you had this pain before? If Yes, ask: When, and what happened then?     Yes, earlier this year she tried injections with the orthopedist.  6. SETTING: Has there been any recent work, exercise or other activity that involved that part of the body?      No.  7. AGGRAVATING FACTORS: What makes the knee pain worse? (e.g., walking, climbing stairs, running)     Any movement.  8. ASSOCIATED SYMPTOMS: Is there any swelling or redness of the knee?     No.  9. OTHER SYMPTOMS: Do you have any other symptoms? (e.g., calf pain, chest pain, difficulty breathing, fever)     No. Denies chest pain, difficulty breathing.  10. PREGNANCY: Is there any chance you are pregnant? When was your last menstrual period?       N/A.  Protocols used: Knee Pain-A-AH

## 2024-01-05 NOTE — Assessment & Plan Note (Signed)
 Controlled, no change in medication DASH diet and commitment to daily physical activity for a minimum of 30 minutes discussed and encouraged, as a part of hypertension management. The importance of attaining a healthy weight is also discussed.     01/05/2024    2:26 PM 01/03/2024    9:00 AM 12/30/2023    8:49 AM 12/01/2023    8:41 AM 12/01/2023    8:12 AM 11/11/2023   10:58 AM 09/01/2023    2:35 PM  BP/Weight  Systolic BP 139 131  120 147  897  Diastolic BP 84 82  78 78  61  Wt. (Lbs) 315 315 320.08  320.08 316 316.6  BMI 47.9 kg/m2 49.34 kg/m2 48.67 kg/m2  48.67 kg/m2 48.05 kg/m2 48.14 kg/m2

## 2024-01-07 ENCOUNTER — Ambulatory Visit
Admission: RE | Admit: 2024-01-07 | Discharge: 2024-01-07 | Disposition: A | Discharge status: Home / Self Care | Source: Ambulatory Visit | Attending: Family Medicine | Admitting: Family Medicine

## 2024-01-07 DIAGNOSIS — Z1231 Encounter for screening mammogram for malignant neoplasm of breast: Secondary | ICD-10-CM

## 2024-01-17 ENCOUNTER — Encounter (INDEPENDENT_AMBULATORY_CARE_PROVIDER_SITE_OTHER): Payer: Self-pay | Admitting: Family Medicine

## 2024-01-17 ENCOUNTER — Ambulatory Visit (INDEPENDENT_AMBULATORY_CARE_PROVIDER_SITE_OTHER): Admitting: Family Medicine

## 2024-01-17 VITALS — BP 134/63 | Temp 98.3°F | Ht 67.0 in | Wt 313.0 lb

## 2024-01-17 DIAGNOSIS — K760 Fatty (change of) liver, not elsewhere classified: Secondary | ICD-10-CM | POA: Diagnosis not present

## 2024-01-17 DIAGNOSIS — D649 Anemia, unspecified: Secondary | ICD-10-CM | POA: Diagnosis not present

## 2024-01-17 DIAGNOSIS — E785 Hyperlipidemia, unspecified: Secondary | ICD-10-CM | POA: Diagnosis not present

## 2024-01-17 DIAGNOSIS — Z6841 Body Mass Index (BMI) 40.0 and over, adult: Secondary | ICD-10-CM

## 2024-01-17 DIAGNOSIS — I10 Essential (primary) hypertension: Secondary | ICD-10-CM

## 2024-01-17 DIAGNOSIS — E88819 Insulin resistance, unspecified: Secondary | ICD-10-CM

## 2024-01-17 DIAGNOSIS — E559 Vitamin D deficiency, unspecified: Secondary | ICD-10-CM

## 2024-01-17 MED ORDER — VITAMIN D (ERGOCALCIFEROL) 1.25 MG (50000 UNIT) PO CAPS: ORAL_CAPSULE | ORAL | 1 refills | Status: DC

## 2024-01-17 NOTE — Patient Instructions (Signed)
 The 10-year ASCVD risk score (Arnett DK, et al., 2019) is: 7.5%   Values used to calculate the score:     Age: 58 years     Clincally relevant sex: Female     Is Non-Hispanic African American: Yes     Diabetic: No     Tobacco smoker: No     Systolic Blood Pressure: 134 mmHg     Is BP treated: Yes     HDL Cholesterol: 43 mg/dL     Total Cholesterol: 180 mg/dL

## 2024-01-17 NOTE — Progress Notes (Signed)
 Shannon Roth, D.O.  ABFM, ABOM Clinical Bariatric Medicine Physician  Office located at: [address] [place], [place]  [address]   Assessment and Plan:   Meds ordered this encounter  Medications   Vitamin D , Ergocalciferol , (DRISDOL ) 1.25 MG (50000 UNIT) CAPS capsule    Sig: 1 tab q sun and 1 q wed    Dispense:  8 capsule    Refill:  1     FOR THE DISEASE OF OBESITY:  BMI 45.0-49.9, adult (HCC) current 49.02 Morbid obesity (HCC) - start BMI 49.32 Assessment & Plan: Since last office visit on 01/03/2024 patient's muscle mass has decreased by 0.6  lbs. Fat mass has decreased by 1.4  lbs. Total body water  has increased by 1 lb.  Counseling done on how various foods will affect these numbers and how to maximize success  Total lbs lost to date: - 2 lbs Total weight loss percentage to date: -0.63 %   Recommended Dietary Goals Shannon Roth is currently in the action stage of change. As such, her goal is to continue weight management plan.  She has agreed to: continue current plan   Behavioral Intervention We discussed the following today: increasing lean protein intake to established goals and decreasing simple carbohydrates   Additional resources provided today: Handout on eggwhite crustless quiche recipe, Handout on protein equivalents of 2 ounces of meat or seafood, Handout on foods with high protein and somewhat low calories, Handout on healthy tuna salad recipe, Handout on Insulin  Resistance and Pre-DM.   Evidence-based interventions for health behavior change were utilized today including the discussion of self monitoring techniques, problem-solving barriers and SMART goal setting techniques.  Regarding patient's less desirable eating habits and patterns, we employed the technique of small changes.   Goal: measuring intake of lean proteins after cooking    Recommended Physical Activity Goals Shannon Roth has been advised to work up to 300-450 minutes of moderate intensity  aerobic activity a week and strengthening exercises 2-3 times per week for cardiovascular health, weight loss maintenance and preservation of muscle mass.   She was encouraged to think about enjoyable ways to start exercising.    Pharmacotherapy Continue with current nutritional and behavioral strategies   ASSOCIATED CONDITIONS ADDRESSED TODAY:   Insulin  resistance - new onset Assessment & Plan: Lab Results  Component Value Date   HGBA1C 5.4 12/01/2023   HGBA1C 5.3 12/23/2022   HGBA1C 5.3 05/13/2022   INSULIN  12.3 01/03/2024   Lab Results  Component Value Date   VITAMINB12 1,044 01/03/2024   FOLATE 6.4 01/03/2024    Lab Results  Component Value Date   TSH 1.250 12/01/2023   FREET4 1.27 01/03/2024   Lab Results  Component Value Date   CREATININE 0.90 12/01/2023   BUN 13 12/01/2023   NA 141 12/01/2023   K 4.2 12/01/2023   CL 103 12/01/2023   CO2 21 12/01/2023   Fasting insulin  is elevated at 12.3. Optimal level <5. Hemoglobin A1c, B12, folate, thyroid levels, and renal parameters are WNL.   - Patient aware of disease state and risk of progression. - Explained role of simple carbs and insulin  levels on hunger and cravings - Educational handout on I.R and Pre-DM provided.  - Educated patient that having adequate amounts of protein with each meal is important for increasing muscle mass, stabilizing sugars, controlling hunger and cravings, and improving thermogenesis. - Continue working on nutrition plan to decrease simple carbohydrates, increase lean proteins and exercise to promote weight loss and  improve glycemic control and prevent progression to Pre-DM.  - Encouraged pt to read more about this condition on the American Diabetes Association website.    Metabolic dysfunction-associated steatotic liver disease (MASLD) Assessment & Plan:    Component Value Date/Time   PROT 7.1 12/01/2023 0911   ALBUMIN 4.2 12/01/2023 0911   AST 16 12/01/2023 0911   ALT 15  12/01/2023 0911   ALKPHOS 93 12/01/2023 0911   BILITOT 0.3 12/01/2023 0911   BILIDIR 0.1 12/03/2017 0727   IBILI 0.4 12/03/2017 0727    MRI of abdomen dated 12/2015 demonstrated hepatomegaly and mild hepatic steatosis. Most recent liver enzymes dated 12/01/2023 was within acceptable ranges.  - Pt aware of disease state - Continue reducing saturated fats as well as simple and added sugars in their diet. Also avoidance of alcohol.    Anemia, unspecified type Assessment & Plan: Her Hemoglobin has been worsening over the past several months. Optimal level > 11.5.  Recommend she f/up with PCP for an extended anemia panel or obtain a referral to hematology.     Dyslipidemia Assessment & Plan: Lab Results  Component Value Date   CHOL 180 12/01/2023   HDL 43 12/01/2023   LDLCALC 119 (H) 12/01/2023   TRIG 100 12/01/2023   CHOLHDL 4.2 12/01/2023   The 10-year ASCVD risk score (Arnett DK, et al., 2019) is: 7.5%   Values used to calculate the score:     Age: 58 years     Clincally relevant sex: Female     Is Non-Hispanic African American: Yes     Diabetic: No     Tobacco smoker: No     Systolic Blood Pressure: 134 mmHg     Is BP treated: Yes     HDL Cholesterol: 43 mg/dL     Total Cholesterol: 180 mg/dL  She has a history of dyslipidemia. Lipid panel WNL w/expection of LDL. Optimal level <100. Her ASCVD risk score is >5 %. Mother had CVA. Father MI in his 2s. Twin brother has had several myocardial infarctions, ICD.  - Avoid fatty meats, butters, oils, cheeses, creams, etc - Limit red meat intake to no more than two days a week  - Continue to maintain a diet low in saturated  and trans fats. - Recommend she discuss with PCP about potentially starting a statin given her FHx.    Essential hypertension Assessment & Plan: Last 3 blood pressure readings in our office are as follows: BP Readings from Last 3 Encounters:  01/17/24 134/63  01/05/24 139/84  01/03/24 131/82   The  10-year ASCVD risk score (Arnett DK, et al., 2019) is: 7.5%  Lab Results  Component Value Date   CREATININE 0.90 12/01/2023   On Olmesartan  20 mg daily and Spironolactone  100 mg daily with reported good compliance and tolerance. Blood pressure is controlled today. No acute concerns.   - Continue all medications.  - Continue to work on nutrition plan to promote weight loss and improve BP control.     Vitamin D  deficiency Assessment & Plan: Lab Results  Component Value Date   VD25OH 17.6 (L) 01/03/2024   VD25OH 31.6 08/03/2023   VD25OH 18.7 (L) 12/23/2022   She was on ergocalciferol  50,000 units weekly for 5 years and during that time her vit D levels were anywhere between 10-31. States her PCP discontinued ergocalciferol  back in June. Her Vit D levels are not at goal of 50 to 70.   - Discussed the importance of vitamin D  to the  patient's health and well-being as well as to their ability to lose weight.  - After discussion of benefits, alternative treatment options and side effects patient will be started on Ergocalciferol  50,000 units twice weekly.  - Recheck 3 months     FOLLOW UP:   Return 02/09/2024 9:30 AM with Shannon Roth BRAVO, NP. She was informed of the importance of frequent follow up visits to maximize her success with intensive lifestyle modifications for her multiple health conditions.   Subjective:   Chief complaint: Obesity Shannon Roth is here to discuss her progress with her obesity treatment plan. She is on the Category 2 Plan with Breakfast and Lunch options and states she is following her eating plan approximately 100 % of the time. She states she is not exercising due to knee pain.    Interval History:  Shannon Roth is here today for her first follow-up office visit since starting the program with us . Patient is off to a good start and has lost 2 lbs. States the meal plan was hard to follow initially but that she is now adjusting to the foods. She is eating 3  meals a day. Upon food recall, she appears to be under-eating. She does endorse feeling hungry at times which is likely secondary to being under in her protein intake. She measures her protein sometimes prior to cooking it. She has decreased her soda consumption from 3-4 per day to once daily.   Pharmacotherapy for weight loss: none    Review of Systems:  Pertinent positives were addressed with patient today.   Reviewed by clinician on day of visit: allergies, medications, problem list, medical history, surgical history, family history, social history, and previous encounter notes.  Weight Summary and Biometrics   Weight Lost Since Last Visit: 2lb  Weight Gained Since Last Visit: 0   Vitals Temp: 98.3 F (36.8 C) BP: 134/63 Pulse Rate: -- (Could not get due to long nails) SpO2: -- (Could not get due to long nails)   Anthropometric Measurements Height: 5' 7 (1.702 m) Weight: (!) 313 lb (142 kg) BMI (Calculated): 49.01 Weight at Last Visit: 315lb Weight Lost Since Last Visit: 2lb Weight Gained Since Last Visit: 0 Starting Weight: 315lb Total Weight Loss (lbs): 2 lb (0.907 kg) Peak Weight: 327lb Waist Measurement : 55 inches   Body Composition  Body Fat %: 54.4 % Fat Mass (lbs): 170.4 lbs Muscle Mass (lbs): 135.8 lbs Total Body Water  (lbs): 98.4 lbs Visceral Fat Rating : 20   Other Clinical Data Fasting: No Labs: No Today's Visit #: 2 Starting Date: 01/03/24    Objective:   PHYSICAL EXAM:  Blood pressure 134/63, temperature 98.3 F (36.8 C), height 5' 7 (1.702 m), weight (!) 313 lb (142 kg). Body mass index is 49.02 kg/m.  General: she is overweight, cooperative and in no acute distress.   HEENT: EOMI, sclerae are anicteric. Lungs: Normal breathing effort, no conversational dyspnea. M-Sk:  Normal gross ROM * 4 extremities  PSYCH: Has normal mood, affect and thought process. Neurologic: No gross sensory or motor deficits. Well developed, A and O *  3  DIAGNOSTIC DATA REVIEWED:  BMET    Component Value Date/Time   NA 141 12/01/2023 0911   K 4.2 12/01/2023 0911   CL 103 12/01/2023 0911   CO2 21 12/01/2023 0911   GLUCOSE 80 12/01/2023 0911   GLUCOSE 101 (H) 10/18/2021 0828   BUN 13 12/01/2023 0911   CREATININE 0.90 12/01/2023 0911   CALCIUM 9.6 12/01/2023  0911   GFRNONAA >60 10/18/2021 0828   GFRAA 103 01/08/2020 0919   Lab Results  Component Value Date   HGBA1C 5.4 12/01/2023   HGBA1C 5.6 01/08/2020   Lab Results  Component Value Date   INSULIN  12.3 01/03/2024   Lab Results  Component Value Date   TSH 1.250 12/01/2023   CBC    Component Value Date/Time   WBC 8.2 12/01/2023 0911   WBC 6.5 10/18/2021 0828   RBC 4.38 12/01/2023 0911   RBC 4.75 10/18/2021 0828   HGB 10.1 (L) 12/01/2023 0911   HCT 35.7 12/01/2023 0911   PLT 325 12/01/2023 0911   MCV 82 12/01/2023 0911   MCH 23.1 (L) 12/01/2023 0911   MCH 23.4 (L) 10/18/2021 0828   MCHC 28.3 (L) 12/01/2023 0911   MCHC 30.0 10/18/2021 0828   RDW 12.9 12/01/2023 0911   Iron Studies    Component Value Date/Time   IRON 65 12/01/2023 0911   TIBC 244 (L) 12/03/2017 0727   FERRITIN 467 (H) 01/08/2020 0919   IRONPCTSAT 30 12/03/2017 0727   Lipid Panel     Component Value Date/Time   CHOL 180 12/01/2023 0911   TRIG 100 12/01/2023 0911   HDL 43 12/01/2023 0911   CHOLHDL 4.2 12/01/2023 0911   CHOLHDL 3.7 Ratio 03/09/2009 1850   VLDL 18 03/09/2009 1850   LDLCALC 119 (H) 12/01/2023 0911   Hepatic Function Panel     Component Value Date/Time   PROT 7.1 12/01/2023 0911   ALBUMIN 4.2 12/01/2023 0911   AST 16 12/01/2023 0911   ALT 15 12/01/2023 0911   ALKPHOS 93 12/01/2023 0911   BILITOT 0.3 12/01/2023 0911   BILIDIR 0.1 12/03/2017 0727   IBILI 0.4 12/03/2017 0727      Component Value Date/Time   TSH 1.250 12/01/2023 0911   Nutritional Lab Results  Component Value Date   VD25OH 17.6 (L) 01/03/2024   VD25OH 31.6 08/03/2023   VD25OH 18.7 (L)  12/23/2022    Attestations:   I, Special Puri, acting as a Stage manager for Shannon Jenkins, DO., have compiled all relevant documentation for today's office visit on behalf of Shannon Jenkins, DO, while in the presence of [employer], DO.  I have spent 53 minutes in the care of the patient today including 43 minutes was spent on face-to-face counseling and reviewing listed medical problems above as outlined in office visit note, providing nutritional and behavioral counseling as outlined in obesity care plan, independently interpreting results and goals of care, see listed medical problems, and discussing biometric information and progress. We reviewed her meal plan and discussed how the foods she's eating is affecting each one of her labs. Pt educated on why we want her to eat various foods in various amounts and has a better understanding of the nutritional plan because of this. All her questions were answered today.   I have reviewed the above documentation for accuracy and completeness, and I agree with the above. Shannon JINNY Roth, D.O.  The 21st Century Cures Act was signed into law in 2016 which includes the topic of electronic health records.  This provides immediate access to information in MyChart.  This includes consultation notes, operative notes, office notes, lab results and pathology reports.  If you have any questions about what you read please let us  know at your next visit so we can discuss your concerns and take corrective action if need be.  We are right here with you.

## 2024-01-20 ENCOUNTER — Ambulatory Visit (INDEPENDENT_AMBULATORY_CARE_PROVIDER_SITE_OTHER): Admitting: Podiatry

## 2024-01-20 DIAGNOSIS — G5752 Tarsal tunnel syndrome, left lower limb: Secondary | ICD-10-CM | POA: Diagnosis not present

## 2024-01-20 DIAGNOSIS — M722 Plantar fascial fibromatosis: Secondary | ICD-10-CM

## 2024-01-20 NOTE — Progress Notes (Signed)
 Subjective:   Patient ID: Shannon Roth, female   DOB: 58 y.o.   MRN: [id number]   HPI Patient states her foot is feeling much better with significant reduction of discomfort plantar heel   ROS      Objective:  Physical Exam  Neurovascular status intact with the patient's plantar heel much improved over where it was minimal discomfort noted currently     Assessment:  Patient is done excellent with treatment for inflamed plantar fascia     Plan:  H&P reviewed and at this point recommended continued stretching anti-inflammatories as needed physical therapy shoe gear modifications and will be seen back as symptoms indicate

## 2024-02-09 ENCOUNTER — Encounter (INDEPENDENT_AMBULATORY_CARE_PROVIDER_SITE_OTHER): Payer: Self-pay | Admitting: Nurse Practitioner

## 2024-02-09 ENCOUNTER — Ambulatory Visit (INDEPENDENT_AMBULATORY_CARE_PROVIDER_SITE_OTHER): Admitting: Nurse Practitioner

## 2024-02-09 VITALS — BP 106/67 | HR 55 | Temp 98.6°F | Ht 67.0 in | Wt 304.0 lb

## 2024-02-09 DIAGNOSIS — I10 Essential (primary) hypertension: Secondary | ICD-10-CM | POA: Diagnosis not present

## 2024-02-09 DIAGNOSIS — E66813 Obesity, class 3: Secondary | ICD-10-CM

## 2024-02-09 DIAGNOSIS — E88819 Insulin resistance, unspecified: Secondary | ICD-10-CM | POA: Diagnosis not present

## 2024-02-09 DIAGNOSIS — E785 Hyperlipidemia, unspecified: Secondary | ICD-10-CM

## 2024-02-09 DIAGNOSIS — K760 Fatty (change of) liver, not elsewhere classified: Secondary | ICD-10-CM

## 2024-02-09 DIAGNOSIS — E559 Vitamin D deficiency, unspecified: Secondary | ICD-10-CM

## 2024-02-09 DIAGNOSIS — Z6841 Body Mass Index (BMI) 40.0 and over, adult: Secondary | ICD-10-CM

## 2024-02-09 NOTE — Progress Notes (Signed)
 Office: [phone]  /  Fax: [phone]  Weight Summary And Biometrics  Vitals Temp: 98.6 F (37 C) BP: 106/67 Pulse Rate: (!) 55 SpO2: 100 %   Anthropometric Measurements Height: 5' 7 (1.702 m) Weight: (!) 304 lb (137.9 kg) BMI (Calculated): 47.6 Weight at Last Visit: 315 lb Weight Lost Since Last Visit: 9 lb Weight Gained Since Last Visit: 0 Starting Weight: 315 lb Total Weight Loss (lbs): 11 lb (4.99 kg) Peak Weight: 327 lb Waist Measurement : 55 inches   Body Composition  Body Fat %: 53.2 % Fat Mass (lbs): 162 lbs Muscle Mass (lbs): 135.2 lbs Total Body Water  (lbs): 93.2 lbs Visceral Fat Rating : 20    No data recorded Today's Visit #: 3  Starting Date: 01/03/24  Total weight loss: 9 pounds PBW: 2.9%  Subjective   Chief Complaint: Obesity  Shannon Roth is here to discuss her progress with her obesity treatment plan. She is on the the Category 2 Plan and states she is following her eating plan approximately 100 % of the time. She states she is exercising 45 minutes 1 times per week.  Interval History:   Since last office visit she has lost 9 pounds. She reports good adherence to reduced calorie nutritional plan. She has been working on reading food labels, not skipping meals, increasing protein intake at every meal, eating more fruits, eating more vegetables, drinking more water , making healthier choices, reducing portion sizes, incorporating more whole foods, and increasing physical activity levels - she started exercise class last night- wanting to do 30 minutes every other day. She needs to lose 75 pounds before bilateral knee replacement  Blood pressure is currently well controlled with Benicar  20 mg every day and spironolactone  100 mg every day  She does have insulin  resistance- HOMA-IR 2.43. No current medication Has MASLD, last liver enzymes were in normal range  Body Composition Scale: Down 8.4 pounds of fat and 0.6 pounds of muscle  Symptom  Review:  Denies problems with appetite and hunger signals.  Denies problems with satiety and satiation.  Denies problems with eating patterns and portion control.  Reports sweets cravings so had birthday cake at her birthday Reports feeling deprived or restricted. Energy Levels: high Sleep Quality: Good   Barriers identified: food felt restrictive due to cost but has been able to restructure finances for her nutrition plan.   Pharmacotherapy for weight loss: She is currently no taking medication for weight loss- GLP1's are not covered by her insurance.   Assessment and Plan   Treatment Plan For Obesity:  Recommended Dietary Goals  Shannon Roth is currently in the action stage of change. As such, her goal is to continue weight management plan. She has agreed to: follow the Category 2 plan - 1200 kcal per day and continue current plan  Behavioral Intervention  We discussed the following Behavioral Modification Strategies today: continue to work on maintaining a reduced calorie state, getting the recommended amount of protein, incorporating whole foods, making healthy choices, staying well hydrated and practicing mindfulness when eating. and increase protein intake, fibrous foods (25 grams per day for women, 30 grams for men) and water  to improve satiety and decrease hunger signals. .  Additional resources provided today: None  Recommended Physical Activity Goals  Shannon Roth has been advised to work up to 150 minutes of moderate intensity aerobic activity a week and strengthening exercises 2-3 times per week for cardiovascular health, weight loss maintenance and preservation of muscle mass.   She has agreed  to :  Think about enjoyable ways to increase daily physical activity and overcoming barriers to exercise, Increase physical activity in their day and reduce sedentary time (increase NEAT)., and Start aerobic activity with a goal of 150 minutes a week at moderate intensity.    Pharmacotherapy  Will continue nutrition and behavioral therapy  Associated Conditions Addressed Today  Essential hypertension Continue Benicar  and monitor BP. If BP becomes consistently > 140/90, develops Headaches, dizziness or visual changes notify PCP  Insulin  resistance - new onset Continue to follow Category 2 meal plan Aim for 120 grams of protein and 90 ounces of water  Limit simple carbs Increase activity to 150 minutes/week May possibly add Metformin in the future  Metabolic dysfunction-associated steatotic liver disease (MASLD) Continue to follow Category 2 meal plan Aim for 120 grams of protein and 90 ounces of water  Limit simple carbs Increase activity to 150 minutes/week  Dyslipidemia Continue to follow Category 2 meal plan Aim for 120 grams of protein and 90 ounces of water  Limit simple carbs and saturated fats Increase activity to 150 minutes/week  Vitamin D  deficiency Continue ergocalciferol  50000 units twice a week Last check 01/03/24 was very low at 17.6  Class 3 severe obesity with serious comorbidity and body mass index (BMI) of 45.0 to 49.9 in adult, unspecified obesity type Continue to follow Category 2 meal plan Aim for 120 grams of protein and 90 ounces of water  Limit simple carbs and saturated fats Increase activity to 150 minutes/week    Objective   Physical Exam:  Blood pressure 106/67, pulse (!) 55, temperature 98.6 F (37 C), height 5' 7 (1.702 m), weight (!) 304 lb (137.9 kg), SpO2 100%. Body mass index is 47.61 kg/m.  General: She is overweight, cooperative, alert, well developed, and in no acute distress. PSYCH: Has normal mood, affect and thought process.   HEENT: EOMI, sclerae are anicteric. Lungs: Normal breathing effort, no conversational dyspnea. Extremities: No edema.  Neurologic: No gross sensory or motor deficits. No tremors or fasciculations noted.    Diagnostic Data Reviewed:  BMET    Component Value Date/Time    NA 141 12/01/2023 0911   K 4.2 12/01/2023 0911   CL 103 12/01/2023 0911   CO2 21 12/01/2023 0911   GLUCOSE 80 12/01/2023 0911   GLUCOSE 101 (H) 10/18/2021 0828   BUN 13 12/01/2023 0911   CREATININE 0.90 12/01/2023 0911   CALCIUM 9.6 12/01/2023 0911   GFRNONAA >60 10/18/2021 0828   GFRAA 103 01/08/2020 0919   Lab Results  Component Value Date   HGBA1C 5.4 12/01/2023   HGBA1C 5.6 01/08/2020   Lab Results  Component Value Date   INSULIN  12.3 01/03/2024   Lab Results  Component Value Date   TSH 1.250 12/01/2023   CBC    Component Value Date/Time   WBC 8.2 12/01/2023 0911   WBC 6.5 10/18/2021 0828   RBC 4.38 12/01/2023 0911   RBC 4.75 10/18/2021 0828   HGB 10.1 (L) 12/01/2023 0911   HCT 35.7 12/01/2023 0911   PLT 325 12/01/2023 0911   MCV 82 12/01/2023 0911   MCH 23.1 (L) 12/01/2023 0911   MCH 23.4 (L) 10/18/2021 0828   MCHC 28.3 (L) 12/01/2023 0911   MCHC 30.0 10/18/2021 0828   RDW 12.9 12/01/2023 0911   Iron Studies    Component Value Date/Time   IRON 65 12/01/2023 0911   TIBC 244 (L) 12/03/2017 0727   FERRITIN 467 (H) 01/08/2020 0919   IRONPCTSAT 30 12/03/2017 0727  Lipid Panel     Component Value Date/Time   CHOL 180 12/01/2023 0911   TRIG 100 12/01/2023 0911   HDL 43 12/01/2023 0911   CHOLHDL 4.2 12/01/2023 0911   CHOLHDL 3.7 Ratio 03/09/2009 1850   VLDL 18 03/09/2009 1850   LDLCALC 119 (H) 12/01/2023 0911   Hepatic Function Panel     Component Value Date/Time   PROT 7.1 12/01/2023 0911   ALBUMIN 4.2 12/01/2023 0911   AST 16 12/01/2023 0911   ALT 15 12/01/2023 0911   ALKPHOS 93 12/01/2023 0911   BILITOT 0.3 12/01/2023 0911   BILIDIR 0.1 12/03/2017 0727   IBILI 0.4 12/03/2017 0727      Component Value Date/Time   TSH 1.250 12/01/2023 0911   Nutritional Lab Results  Component Value Date   VD25OH 17.6 (L) 01/03/2024   VD25OH 31.6 08/03/2023   VD25OH 18.7 (L) 12/23/2022    Follow-Up   No follow-ups on file.Shannon Roth She was informed of the  importance of frequent follow up visits to maximize her success with intensive lifestyle modifications for her multiple health conditions.  Attestation Statement   Reviewed by clinician on day of visit: allergies, medications, problem list, medical history, surgical history, family history, social history, and previous encounter notes.   I personally spent a total of 38 minutes in the care of the patient today including preparing to see the patient, getting/reviewing separately obtained history, performing a medically appropriate exam/evaluation, counseling and educating, documenting clinical information in the EHR, and independently interpreting results.   Derran Sear ANP-C

## 2024-02-17 ENCOUNTER — Encounter: Payer: Self-pay | Admitting: Podiatry

## 2024-02-17 ENCOUNTER — Ambulatory Visit: Admitting: Podiatry

## 2024-02-17 DIAGNOSIS — M722 Plantar fascial fibromatosis: Secondary | ICD-10-CM

## 2024-02-17 DIAGNOSIS — L600 Ingrowing nail: Secondary | ICD-10-CM

## 2024-02-17 DIAGNOSIS — B351 Tinea unguium: Secondary | ICD-10-CM

## 2024-02-17 MED ORDER — NEOMYCIN-POLYMYXIN-HC 3.5-10000-1 OT SUSP
OTIC | 0 refills | Status: AC
Start: 2024-02-17 — End: ?

## 2024-02-17 NOTE — Patient Instructions (Signed)
 Place 1/4 cup of epsom salts in a quart of warm tap water .  Submerge your foot or feet in the solution and soak for 20 minutes.  This soak should be done twice a day.  Next, remove your foot or feet from solution, blot dry the affected area. Apply ointment or drops and cover if instructed by your doctor.   IF YOUR SKIN BECOMES IRRITATED WHILE USING THESE INSTRUCTIONS, IT IS OKAY TO SWITCH TO  WHITE VINEGAR AND WATER .  As another alternative soak, you may use antibacterial soap and water .  Monitor for any signs/symptoms of infection. Call the office immediately if any occur or go directly to the emergency room. Call with any questions/concerns.

## 2024-02-17 NOTE — Progress Notes (Signed)
 Subjective:  Patient ID: Shannon Roth, female    DOB: 28-Aug-1965,  MRN: [id number]  Shannon Roth presents to clinic today for:  Chief Complaint  Patient presents with   Toe Pain    L great toe feeling good atill some dicoloration.  R great toe has been sore last few days.  Patient comes in with concern for new pain to the right first toe medial border of the toenail.  Has been going on for little over a week and is very sensitive to any touch and with shoe gear.  Has concern for new ingrown toenail here.  Left foot has been using topical Penlac , has noticed pretty good progression overall.  Also has been dealing with plantar fasciitis and prior flatfoot pain which she is doing well for however she is requesting inserts.  PCP is [name], MD.  Allergies  Allergen Reactions   Penicillin G Other (See Comments)    GI Intolerance    Tramadol     Codeine Nausea And Vomiting   Gabapentin Rash   Penicillins Rash    Review of Systems: Negative except as noted in the HPI.  Objective:  There were no vitals filed for this visit.  Shannon Roth is a pleasant 58 y.o. female in NAD. AAO x 3.  Vascular Examination: Capillary refill time is 3 seconds to toes bilateral. Palpable pedal pulses b/l LE. Digital hair present b/l. No pedal edema b/l. Skin temperature gradient WNL b/l. No varicosities b/l. No cyanosis or clubbing noted b/l.   Dermatological Examination: There is incurvation of the right hallux medial nail border.  There is pain on palpation of the affected nail border.  No significant erythema, edema or active drainage noted.  Mycotic nail changes noted to the left first, second, fifth toenails do appear improved with improved coloration and decreased thickness overall.  Neurological Examination: Protective sensation intact with Semmes-Weinstein 10 gram monofilament b/l LE. Vibratory sensation intact b/l LE.  Musculoskeletal examination: Muscle strength 5/5 all  major muscle groups.  Some arch collapse of weightbearing, flexible mild pes planus foot type.  No pain on palpation of bilateral plantar heel today.     Latest Ref Rng & Units 12/01/2023    9:11 AM  Hemoglobin A1C  Hemoglobin-A1c 4.8 - 5.6 % 5.4      Assessment/Plan: 1. Ingrown toenail of right foot   2. Onychomycosis of toenail   3. Plantar fasciitis of left foot     Meds ordered this encounter  Medications   neomycin -polymyxin-hydrocortisone (CORTISPORIN) 3.5-10000-1 OTIC suspension    Sig: Apply 1-2 drops daily after soaking and cover with bandaid    Dispense:  10 mL    Refill:  0   # Right ingrown toenail first toe medial border Discussed patient's condition today.  After obtaining patient consent, the right hallux was anesthetized with a 50:50 mixture of 1% lidocaine  plain and 0.5% bupivacaine plain for a total of 3cc's administered.  Toe prepped with Betadine.  Upon confirmation of anesthesia, a freer elevator was utilized to free the right hallux medial nail border from the nail bed.  The nail border was then avulsed proximal to the eponychium and removed in toto.  The area was inspected for any remaining spicules.  A chemical matrixectomy was performed with phenol and neutralized with isopropyl alcohol solution.  Antibiotic ointment and a DSD were applied, followed by a Coban dressing.  Patient tolerated the anesthetic and procedure well and will  f/u in 2-3 weeks for recheck.  Patient given post-procedure instructions for daily 20-minute Epsom salt soaks, antibiotic ointment and daily use of Bandaids until toe starts to dry / form eschar.   # Onychomycosis of toenails left foot - Can continue the Penlac  for another month or so if she continues to notice improvement but feel that she is close to having this issue resolved.  Did discuss that she may still have residual nail thickness and discoloration from nail damage over time.  # Left foot plantar fasciitis - Doing pretty well at  this point - Discussed importance of continued stretching regimen - Continue with good supportive shoe gear - She is requesting good over-the-counter inserts today.  Power steps were dispensed to the patient.  Discussed appropriate ramp-up and break-in period   Return in 2 weeks (on 03/02/2024) for Nail Check.   Ethan Saddler, DPM, AACFAS [facility]     [address] [place], [place] [address]                Office [phone]  Fax [phone]

## 2024-03-02 ENCOUNTER — Encounter: Payer: Self-pay | Admitting: Podiatry

## 2024-03-02 ENCOUNTER — Ambulatory Visit (INDEPENDENT_AMBULATORY_CARE_PROVIDER_SITE_OTHER): Admitting: Podiatry

## 2024-03-02 DIAGNOSIS — L600 Ingrowing nail: Secondary | ICD-10-CM

## 2024-03-02 NOTE — Progress Notes (Signed)
       Subjective:  Patient ID: Shannon Roth, female    DOB: 15-Dec-1965,  MRN: [id number]  Chief Complaint  Patient presents with   Toe Pain   Ingrown Toenail    R great Toe nail Recheck. Feeling good .scabbed. Soaked did not use drops.  Minimal swelling, grandson stepped on last night.  Not diabetic no anti coag    Shannon Roth presents to clinic today for f/u of PNA to the right first toe medial border.  Doing well.  Area seems to be scabbed over.  Denies any pain in the area.  Reports that grandson did stepped on the toe last night but other than this there are no issues whatsoever.  PCP is [name], MD.  Allergies  Allergen Reactions   Penicillin G Other (See Comments)    GI Intolerance    Tramadol     Codeine Nausea And Vomiting   Gabapentin Rash   Penicillins Rash    Objective:  There were no vitals filed for this visit.  Vascular Examination: Capillary refill time is less than 3 seconds to toes bilateral. Palpable pedal pulses b/l LE. Digital hair present b/l. No pedal edema b/l. Skin temperature gradient WNL b/l. No varicosities b/l. No cyanosis or clubbing noted b/l.   Dermatological Examination: Upon inspection of the PNA site, there are no clinical signs of infection.  No purulence, no necrosis, no malodor present.  No erythema present.  Eschar formed along nail margin.  Minimal to no pain on palpation of area.   Assessment/Plan: 1. Ingrown toenail of right foot     No orders of the defined types were placed in this encounter.  Findings reviewed with patient.  The ingrown toenail site is healing very well.  Can discontinue soaking and bandaging.  Return if symptoms worsen or fail to improve.   Ayano Douthitt L. Lamount MAUL, AACFAS [facility]     [address] [place], [place] [address]                Office [phone]  Fax [phone]

## 2024-03-08 ENCOUNTER — Ambulatory Visit (INDEPENDENT_AMBULATORY_CARE_PROVIDER_SITE_OTHER): Admitting: Family Medicine

## 2024-03-08 ENCOUNTER — Encounter (INDEPENDENT_AMBULATORY_CARE_PROVIDER_SITE_OTHER): Payer: Self-pay | Admitting: Family Medicine

## 2024-03-08 DIAGNOSIS — Z6841 Body Mass Index (BMI) 40.0 and over, adult: Secondary | ICD-10-CM

## 2024-03-08 DIAGNOSIS — E88819 Insulin resistance, unspecified: Secondary | ICD-10-CM | POA: Diagnosis not present

## 2024-03-08 DIAGNOSIS — F430 Acute stress reaction: Secondary | ICD-10-CM

## 2024-03-08 DIAGNOSIS — I10 Essential (primary) hypertension: Secondary | ICD-10-CM

## 2024-03-10 NOTE — Progress Notes (Signed)
 Shannon Roth, D.O.  ABFM, ABOM Specializing in Clinical Bariatric Medicine  Office located at: [address] [place], [place]  [address]      A) FOR THE CHRONIC DISEASE OF OBESITY:  Chief complaint: Obesity Shannon Roth is here to discuss her progress with her obesity treatment plan.   History of present illness / Interval history:  Shannon Roth is here today for her follow-up office visit.  Since last OV on 02/09/24, pt weight remains unchanged. Pt reports eating out less and feels Shannon Roth has been doing well on the plan.     02/09/24 09:00 03/08/24   Body Fat % 53.2 % 53.8 %  Muscle Mass (lbs) 135.2 lbs 133.8 lbs  Fat Mass (lbs) 162 lbs 163.8 lbs  Total Body Water  (lbs) 93.2 lbs 95.8 lbs  Visceral Fat Rating  20 20   Counseling done on how various foods will affect these numbers and how to maximize success   Total lbs lost to date: -11 lbs Total Fat Mass in lbs lost to date: -11.8 Total weight loss percentage to date: -3.49 %   Nutrition Therapy Shannon Roth is on the Category 2 Plan and states Shannon Roth is following her eating plan approximately 100 % of the time.   - Tracking Calories/Macros: no -    - Eating More Whole Foods: yes  - Adequate Protein Intake: yes  - Adequate Water  Intake: no -    - Skipping Meals: no -    - Sleeping 7-9 Hours/ Night: yes   Shannon Roth is currently in the action stage of change. As such, her goal is to continue weight management plan.  Shannon Roth has agreed to: continue current plan   Physical Activity Pt is walking 30 minutes 3 days per week   Temperance has been advised to work up to 300-450 minutes of moderate intensity aerobic activity a week and strengthening exercises 2-3 times per week for cardiovascular health, weight loss maintenance and preservation of muscle mass.  Shannon Roth has agreed to : Think about enjoyable ways to increase daily physical activity and overcoming barriers to exercise and Increase physical activity in their day and reduce  sedentary time (increase NEAT).   Behavioral Modifications Evidence-based interventions for health behavior change were utilized today including the discussion of  1) self monitoring techniques:  journaling 2) problem-solving barriers:  none. 3) self care:  mindful of eating, praying, setting boundaries, and making time yourself.  4) SMART goals for next OV:  Exercise 5-10 minutes per day and practice 4-7-8 breathing for 5 minutes twice daily.  Regarding patient's less desirable eating habits and patterns, we employed the technique of small changes.   We discussed the following today: work on tracking and journaling calories using tracking application and better snacking choices, smaller meals throughout the day, and 4-7-8 breathing. Additional resources provided today: Handout on risks/ benefits of metformin and associated Myths of use and Handout on benefits of metformin for weight loss   Medical Interventions/ Pharmacotherapy Previous Bariatric surgery: no Pharmacotherapy for weight loss: Shannon Roth is not currently taking medications  for medical weight loss.  -GLP1's are not covered by her insurance  We discussed various medication options to help Shannon Roth with her weight loss efforts and we both agreed to : Continue with current nutritional and behavioral strategies   B) OBESITY RELATED CONDITIONS ADDRESSED TODAY:  Morbid obesity (HCC) - start BMI 49.32 BMI 45.0-49.9, adult (HCC) current 47.6   Insulin  resistance Assessment & Plan Lab Results  Component  Value Date   HGBA1C 5.4 12/01/2023   HGBA1C 5.3 12/23/2022   HGBA1C 5.3 05/13/2022   INSULIN  12.3 01/03/2024  Currently managed by diet and lifestyle interventions; GLP1's are not covered by insurance.Hunger and cravings are well controlled. States when Shannon Roth does feel hunger, Shannon Roth knows its because Shannon Roth didn't have enough protein intake. Discussed Metformin to help with IR and subside hunger and cravings. Educated pt about Meformin does  and its potential side effects; Provided handouts. At next visit we will revisit this option. Shannon Roth is at goal today.No acute concerns today. Encouraged pt to be mindful of snack calories and recommended her to measure her intake. Continue a high protein, low carb, nutrient rich diet and exercise as able.      Essential hypertension Assessment & Plan BP Readings from Last 3 Encounters:  03/08/24 124/77  02/09/24 106/67  01/17/24 134/63   The 10-year ASCVD risk score (Arnett DK, et al., 2019) is: 6.2%  Lab Results  Component Value Date   CREATININE 0.90 12/01/2023  Currently on Spironolactone  100 mg daily and Benicar  20 mg daily. Systolic BP slightly elevated today at 124/77, optimal <120/80. Encouraged to monitor BP at home. Pt asx. No acute concerns today. Continue low sodium, heart healthy meal plan and exercise as able.     Acute reaction to situational stress Assessment & Plan Reports increased stress as her son's girlfriend is in the hospital for early delivery, and states Shannon Roth forgot to eat last night due to the stress.Additionally, both parents have cancer, contributing to increased stress. Pt was encouraged to set boundaries with other, continue praying/meditation, and taking care of herself first. Educated pt about about 4-7-8 breathing to help with stress and encouraged her to practice 4-7-8 breathing twice a day for 5 minutes.            Follow up:   Return 04/11/2024 10:40 AM.  Shannon Roth was informed of the importance of frequent follow up visits to maximize her success with intensive lifestyle modifications for her multiple health conditions.   Weight Summary and Biometrics    Objective:   PHYSICAL EXAM: Blood pressure 124/77, pulse (!) 57, temperature 97.7 F (36.5 C), height 5' 7 (1.702 m), weight (!) 304 lb (137.9 kg), SpO2 100%. Body mass index is 47.61 kg/m.  General: Shannon Roth is overweight, cooperative and in no acute distress. PSYCH: Has normal mood, affect and  thought process.   HEENT: EOMI, sclerae are anicteric. Lungs: Normal breathing effort, no conversational dyspnea. Extremities: Moves * 4 Neurologic: A and O * 3, good insight  DIAGNOSTIC DATA REVIEWED: BMET    Component Value Date/Time   NA 141 12/01/2023 0911   K 4.2 12/01/2023 0911   CL 103 12/01/2023 0911   CO2 21 12/01/2023 0911   GLUCOSE 80 12/01/2023 0911   GLUCOSE 101 (H) 10/18/2021 0828   BUN 13 12/01/2023 0911   CREATININE 0.90 12/01/2023 0911   CALCIUM 9.6 12/01/2023 0911   GFRNONAA >60 10/18/2021 0828   GFRAA 103 01/08/2020 0919   Lab Results  Component Value Date   HGBA1C 5.4 12/01/2023   HGBA1C 5.6 01/08/2020   Lab Results  Component Value Date   INSULIN  12.3 01/03/2024   Lab Results  Component Value Date   TSH 1.250 12/01/2023   CBC    Component Value Date/Time   WBC 8.2 12/01/2023 0911   WBC 6.5 10/18/2021 0828   RBC 4.38 12/01/2023 0911   RBC 4.75 10/18/2021 0828   HGB 10.1 (L) 12/01/2023  0911   HCT 35.7 12/01/2023 0911   PLT 325 12/01/2023 0911   MCV 82 12/01/2023 0911   MCH 23.1 (L) 12/01/2023 0911   MCH 23.4 (L) 10/18/2021 0828   MCHC 28.3 (L) 12/01/2023 0911   MCHC 30.0 10/18/2021 0828   RDW 12.9 12/01/2023 0911   Iron Studies    Component Value Date/Time   IRON 65 12/01/2023 0911   TIBC 244 (L) 12/03/2017 0727   FERRITIN 467 (H) 01/08/2020 0919   IRONPCTSAT 30 12/03/2017 0727   Lipid Panel     Component Value Date/Time   CHOL 180 12/01/2023 0911   TRIG 100 12/01/2023 0911   HDL 43 12/01/2023 0911   CHOLHDL 4.2 12/01/2023 0911   CHOLHDL 3.7 Ratio 03/09/2009 1850   VLDL 18 03/09/2009 1850   LDLCALC 119 (H) 12/01/2023 0911   Hepatic Function Panel     Component Value Date/Time   PROT 7.1 12/01/2023 0911   ALBUMIN 4.2 12/01/2023 0911   AST 16 12/01/2023 0911   ALT 15 12/01/2023 0911   ALKPHOS 93 12/01/2023 0911   BILITOT 0.3 12/01/2023 0911   BILIDIR 0.1 12/03/2017 0727   IBILI 0.4 12/03/2017 0727      Component  Value Date/Time   TSH 1.250 12/01/2023 0911   Nutritional Lab Results  Component Value Date   VD25OH 17.6 (L) 01/03/2024   VD25OH 31.6 08/03/2023   VD25OH 18.7 (L) 12/23/2022    Attestations:   LILLETTE Feliciano Mingle, acting as a Stage manager for Shannon Jenkins, DO., have compiled all relevant documentation for today's office visit on behalf of Shannon Jenkins, DO, while in the presence of [employer], DO.    I have reviewed the above documentation for accuracy and completeness, and I agree with the above. Shannon JINNY Roth, D.O.  The 21st Century Cures Act was signed into law in 2016 which includes the topic of electronic health records.  This provides immediate access to information in MyChart.  This includes consultation notes, operative notes, office notes, lab results and pathology reports.  If you have any questions about what you read please let us  know at your next visit so we can discuss your concerns and take corrective action if need be.  We are right here with you.

## 2024-03-12 ENCOUNTER — Other Ambulatory Visit: Payer: Self-pay | Admitting: Family Medicine

## 2024-03-17 ENCOUNTER — Encounter: Admitting: Family Medicine

## 2024-03-24 ENCOUNTER — Ambulatory Visit: Payer: Self-pay | Admitting: *Deleted

## 2024-03-24 NOTE — Telephone Encounter (Signed)
 Patient requesting call back from PCP before going to UC/ED due to paying for specialty visits. No available appt today patient could make .  CAL aware    FYI Only or Action Required?: Action required by provider: request for appointment.  Patient was last seen in primary care on 03/08/2024 by Midge Sober, DO.  Called Nurse Triage reporting Leg Pain.  Symptoms began yesterday.  Interventions attempted: Nothing.  Symptoms are: rapidly worsening.  Triage Disposition: See HCP Within 4 Hours (Or PCP Triage)  Patient/caregiver understands and will follow disposition?: No, wishes to speak with PCP                Copied from CRM #[id number]. Topic: Clinical - Red Word Triage >> Mar 24, 2024 10:40 AM Leonette SQUIBB wrote: Red Word that prompted transfer to Nurse Triage: pain in left leg below the knee.  Swollen and can't pick it up except manually Reason for Disposition  [1] SEVERE pain (e.g., excruciating, unable to do any normal activities) AND [2] not improved after 2 hours of pain medicine  Answer Assessment - Initial Assessment Questions Offered appt today with other provider and patient unable to make appt that soon at 1120. Recommended UC / ED. Patient requesting to see what PCP recommended and would like a call back.  CAL notified.    1. ONSET: When did the pain start?      Left leg and knee pain and started yesterday  2. LOCATION: Where is the pain located?      Left leg below knee  3. PAIN: How bad is the pain?    (Scale 1-10; or mild, moderate, severe)     Burning throbbing. Severe  4. WORK OR EXERCISE: Has there been any recent work or exercise that involved this part of the body?      Limping to walk  5. CAUSE: What do you think is causing the leg pain?     Now sure  6. OTHER SYMPTOMS: Do you have any other symptoms? (e.g., chest pain, back pain, breathing difficulty, swelling, rash, fever, numbness, weakness)     Always SOB , swelling below  knee. Denies chest pain no fever no N/T. Can bear weight but difficulty walking. Limping.  7. PREGNANCY: Is there any chance you are pregnant? When was your last menstrual period?     na  Protocols used: Leg Pain-A-AH

## 2024-03-24 NOTE — Telephone Encounter (Signed)
 scheduled

## 2024-03-27 ENCOUNTER — Encounter: Payer: Self-pay | Admitting: Internal Medicine

## 2024-03-27 ENCOUNTER — Ambulatory Visit (INDEPENDENT_AMBULATORY_CARE_PROVIDER_SITE_OTHER): Admitting: Internal Medicine

## 2024-03-27 VITALS — BP 128/89 | HR 59 | Ht 67.0 in | Wt 310.0 lb

## 2024-03-27 DIAGNOSIS — M1712 Unilateral primary osteoarthritis, left knee: Secondary | ICD-10-CM | POA: Diagnosis not present

## 2024-03-27 DIAGNOSIS — R2242 Localized swelling, mass and lump, left lower limb: Secondary | ICD-10-CM | POA: Insufficient documentation

## 2024-03-27 MED ORDER — IBUPROFEN 800 MG PO TABS: ORAL_TABLET | ORAL | 0 refills | Status: DC

## 2024-03-27 NOTE — Patient Instructions (Addendum)
 Please apply cool compresses over the area.  Please take Ibuprofen  as needed for knee pain and swelling.

## 2024-03-27 NOTE — Assessment & Plan Note (Signed)
 Masslike swelling in the left upper leg area Most likely related to OA of knee and previous history of meniscal tear Advised to get US  of left leg if persistent swelling after conservative treatment Apply cool compresses and perform leg elevation as tolerated

## 2024-03-27 NOTE — Progress Notes (Signed)
 Acute Office Visit  Subjective:    Patient ID: Shannon Roth, female    DOB: February 16, 1966, 58 y.o.   MRN: [id number]  Chief Complaint  Patient presents with   Leg Swelling    Left leg swelling    HPI Patient is in today for c/o left leg swelling for the last 1 week.  She noticed sudden onset left leg swelling, which has been mildly progressive and painful.  She points swelling in the upper leg part on the medial side.  She has tried applying warm compresses for it.  She has history of osteoarthritis of knee and medial meniscal tear in the past, was evaluated by orthopedic surgeon.  She denies any warmth or erythema in the calf area.  Denies any recent episode of prolonged immobilization.  Past Medical History:  Diagnosis Date   Arthritis    Asthma    Bilateral swelling of feet    Cough    Fatty liver    Heartburn    Hypertension    Joint pain    Migraine    Migraine 06/08/2000   Obesity    Rectal bleeding    SOB (shortness of breath)    Vitamin D  deficiency     Past Surgical History:  Procedure Laterality Date   bilateral BIOPSY BREAST  06/08/2000   benign   Bilateral tubal ligation  06/09/1999   BIOPSY  05/17/2018   Procedure: BIOPSY;  Surgeon: Harvey Margo CROME, MD;  [facility]: [facility];  Service: Endoscopy;;  duodenum and gastric   CATARACT EXTRACTION     09/2014   CHOLECYSTECTOMY  06/08/2001   COLONOSCOPY N/A 06/21/2015   SLF: hyperplastic polyps removed. next TCS 10 years   ESOPHAGOGASTRODUODENOSCOPY (EGD) WITH PROPOFOL  N/A 05/17/2018   Procedure: ESOPHAGOGASTRODUODENOSCOPY (EGD) WITH PROPOFOL ;  Surgeon: Harvey Margo CROME, MD;  Location: [facility];  Service: Endoscopy;  Laterality: N/A;  1:45pm   EYE SURGERY     right cataract removal    Gallstones     04/2002   GIVENS CAPSULE STUDY N/A 05/17/2018   Procedure: GIVENS CAPSULE STUDY;  Surgeon: Harvey Margo CROME, MD;  Location: [facility];  Service: Endoscopy;  Laterality: N/A;   TOTAL ABDOMINAL  HYSTERECTOMY W/ BILATERAL SALPINGOOPHORECTOMY  06/08/2001   TUBAL LIGATION      Family History  Problem Relation Age of Onset   Obesity Mother    Depression Mother    Cancer Mother    Kidney disease Mother    Hypertension Mother    Asthma Mother    Diabetes Mother    Arthritis Mother    Heart disease Mother    Sleep apnea Mother    Eating disorder Mother    Alcohol abuse Father    Cancer Father    Kidney disease Father    Hyperlipidemia Father    Hypertension Father    Breast cancer Sister    Stroke Sister    Diabetes Brother    Heart disease Brother    Colon cancer Paternal Uncle        69s   Emphysema Maternal Grandmother    Lung cancer Maternal Grandmother    Emphysema Paternal Grandfather    Liver disease Neg Hx     Social History   Socioeconomic History   Marital status: Single    Spouse name: Not on file   Number of children: 1   Years of education: Not on file   Highest education level: Not on file  Occupational  History   Occupation: Engineer, site  Tobacco Use   Smoking status: Never    Passive exposure: Yes   Smokeless tobacco: Never   Tobacco comments:    Father & close friend  Vaping Use   Vaping status: Never Used  Substance and Sexual Activity   Alcohol use: No    Alcohol/week: 0.0 standard drinks of alcohol   Drug use: No   Sexual activity: Not on file  Other Topics Concern   Not on file  Social History Narrative   Originally from [place]. 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 She reports that she wore a full body suit but questionable respiratory equipment for protection. No bird, mold, or hot tub exposure.    Social Drivers of Corporate investment banker Strain: Not on file  Food Insecurity: Not on file  Transportation Needs: Not on file  Physical Activity: Not on file  Stress: Not on file  Social Connections: Unknown (10/20/2021)   [facility]   Social Network    Social Network: Not on file   Intimate Partner Violence: Unknown (09/11/2021)   Received from [facility]   HITS    Physically Hurt: Not on file    Insult or Talk Down To: Not on file    Threaten Physical Harm: Not on file    Scream or Curse: Not on file    Outpatient Medications Prior to Visit  Medication Sig Dispense Refill   acetaminophen  (TYLENOL ) 650 MG CR tablet Take 1 tablet (650 mg total) by mouth every 8 (eight) hours as needed for pain. 60 tablet 1   albuterol  (VENTOLIN  HFA) 108 (90 Base) MCG/ACT inhaler Inhale 2 puffs into the lungs every 6 (six) hours as needed for wheezing or shortness of breath. 8 g 0   cyclobenzaprine  (FLEXERIL ) 10 MG tablet Take 1 tablet (10 mg total) by mouth at bedtime. 90 tablet 1   fluticasone  (FLONASE ) 50 MCG/ACT nasal spray Place 2 sprays into both nostrils daily. 16 g 0   folic acid  (FOLVITE ) 1 MG tablet TAKE 1 TABLET BY MOUTH DAILY 90 tablet 0   neomycin -polymyxin-hydrocortisone (CORTISPORIN) 3.5-10000-1 OTIC suspension Apply 1-2 drops daily after soaking and cover with bandaid 10 mL 0   olmesartan  (BENICAR ) 20 MG tablet TAKE 1 TABLET(20 MG) BY MOUTH DAILY 30 tablet 1   omeprazole  (PRILOSEC) 20 MG capsule TAKE 1 CAPSULE(20 MG) BY MOUTH DAILY 30 capsule 3   spironolactone  (ALDACTONE ) 100 MG tablet Take 1 tablet (100 mg total) by mouth daily. 30 tablet 11   Vitamin D , Ergocalciferol , (DRISDOL ) 1.25 MG (50000 UNIT) CAPS capsule 1 tab q sun and 1 q wed 8 capsule 1   ibuprofen  (ADVIL ) 800 MG tablet TAKE 1 TABLET BY MOUTH TWICE WEEKLY IF NEEDED FOR UNCONTROLLED PAIN 30 tablet 0   predniSONE  (DELTASONE ) 20 MG tablet Take 1 tablet (20 mg total) by mouth 2 (two) times daily with a meal. 10 tablet 0   No facility-administered medications prior to visit.    Allergies  Allergen Reactions   Penicillin G Other (See Comments)    GI Intolerance    Tramadol     Codeine Nausea And Vomiting   Gabapentin Rash   Penicillins Rash    Review of Systems  Constitutional:  Negative for chills  and fever.  HENT:  Negative for congestion, sinus pressure, sinus pain and sore throat.   Eyes:  Negative for pain and discharge.  Respiratory:  Negative for cough and shortness of breath.  Cardiovascular:  Negative for chest pain and palpitations.  Gastrointestinal:  Negative for abdominal pain, diarrhea, nausea and vomiting.  Endocrine: Negative for polydipsia and polyuria.  Genitourinary:  Negative for dysuria and hematuria.  Musculoskeletal:  Negative for neck pain and neck stiffness.  Skin:  Negative for rash.  Neurological:  Negative for dizziness and weakness.  Psychiatric/Behavioral:  Negative for agitation and behavioral problems.        Objective:    Physical Exam Vitals reviewed.  Constitutional:      General: She is not in acute distress.    Appearance: She is obese. She is not diaphoretic.  HENT:     Head: Normocephalic and atraumatic.     Nose: Nose normal.     Mouth/Throat:     Mouth: Mucous membranes are moist.  Eyes:     General: No scleral icterus.    Extraocular Movements: Extraocular movements intact.  Cardiovascular:     Rate and Rhythm: Normal rate and regular rhythm.     Heart sounds: Normal heart sounds. No murmur heard. Pulmonary:     Breath sounds: Normal breath sounds. No wheezing or rales.  Musculoskeletal:     Cervical back: Neck supple. No tenderness.     Left knee: Swelling present. Decreased range of motion.     Right lower leg: No edema.     Comments: Left upper leg swelling - mass-like swelling on medial side, oval in shape  Skin:    General: Skin is warm.     Findings: No rash.  Neurological:     General: No focal deficit present.     Mental Status: She is alert and oriented to person, place, and time.  Psychiatric:        Mood and Affect: Mood normal.        Behavior: Behavior normal.     BP 128/89   Pulse (!) 59   Ht 5' 7 (1.702 m)   Wt (!) 310 lb (140.6 kg)   SpO2 98%   BMI 48.55 kg/m  Wt Readings from Last 3  Encounters:  03/27/24 (!) 310 lb (140.6 kg)  03/08/24 (!) 304 lb (137.9 kg)  02/09/24 (!) 304 lb (137.9 kg)        Assessment & Plan:   Problem List Items Addressed This Visit       Musculoskeletal and Integument   Primary osteoarthritis of left knee   Her current leg swelling is likely related to OA of left knee Ibuprofen  as needed for pain Advised to apply cool compresses Leg elevation as tolerated       Relevant Medications   ibuprofen  (ADVIL ) 800 MG tablet     Other   Leg mass, left - Primary   Masslike swelling in the left upper leg area Most likely related to OA of knee and previous history of meniscal tear Advised to get US  of left leg if persistent swelling after conservative treatment Apply cool compresses and perform leg elevation as tolerated      Relevant Orders   US  LT LOWER EXTREM LTD SOFT TISSUE NON VASCULAR     Meds ordered this encounter  Medications   ibuprofen  (ADVIL ) 800 MG tablet    Sig: TAKE 1 TABLET BY MOUTH TWICE WEEKLY IF NEEDED FOR UNCONTROLLED PAIN    Dispense:  30 tablet    Refill:  0     Sheyenne Konz MARLA Blanch, MD

## 2024-03-27 NOTE — Assessment & Plan Note (Signed)
 Her current leg swelling is likely related to OA of left knee Ibuprofen  as needed for pain Advised to apply cool compresses Leg elevation as tolerated

## 2024-04-03 ENCOUNTER — Ambulatory Visit (HOSPITAL_COMMUNITY)

## 2024-04-11 ENCOUNTER — Ambulatory Visit (INDEPENDENT_AMBULATORY_CARE_PROVIDER_SITE_OTHER): Payer: Self-pay | Admitting: Family Medicine

## 2024-04-11 ENCOUNTER — Encounter (INDEPENDENT_AMBULATORY_CARE_PROVIDER_SITE_OTHER): Payer: Self-pay | Admitting: Family Medicine

## 2024-04-11 ENCOUNTER — Other Ambulatory Visit (INDEPENDENT_AMBULATORY_CARE_PROVIDER_SITE_OTHER): Payer: Self-pay | Admitting: Family Medicine

## 2024-04-11 DIAGNOSIS — M1712 Unilateral primary osteoarthritis, left knee: Secondary | ICD-10-CM

## 2024-04-11 DIAGNOSIS — I10 Essential (primary) hypertension: Secondary | ICD-10-CM | POA: Diagnosis not present

## 2024-04-11 DIAGNOSIS — E559 Vitamin D deficiency, unspecified: Secondary | ICD-10-CM

## 2024-04-11 DIAGNOSIS — K760 Fatty (change of) liver, not elsewhere classified: Secondary | ICD-10-CM | POA: Diagnosis not present

## 2024-04-11 DIAGNOSIS — E88819 Insulin resistance, unspecified: Secondary | ICD-10-CM | POA: Diagnosis not present

## 2024-04-11 DIAGNOSIS — Z6841 Body Mass Index (BMI) 40.0 and over, adult: Secondary | ICD-10-CM

## 2024-04-11 MED ORDER — METFORMIN HCL 500 MG PO TABS: ORAL_TABLET | ORAL | 0 refills | Status: DC

## 2024-04-11 MED ORDER — VITAMIN D (ERGOCALCIFEROL) 1.25 MG (50000 UNIT) PO CAPS: ORAL_CAPSULE | ORAL | 0 refills | Status: AC

## 2024-04-11 NOTE — Progress Notes (Signed)
 [name], D.O.  ABFM, ABOM Specializing in Clinical Bariatric Medicine  Office located at: [address] [place], [place]  [address]      A) FOR THE CHRONIC DISEASE OF OBESITY:  Chief complaint: Obesity Shannon Roth is here to discuss her progress with her obesity treatment plan.   History of present illness / Interval history:  Shannon Roth is here today for her follow-up office visit.  Since last OV on 03/08/24, pt is down 3 lbs. Patient states that it has been a rough couple of days. She states that her knee gave out and she fell. She endorses having gone to [employer] and did not eat pasta but ate soup and only eating 1 breadstick.     03/08/24 04/11/24 10:00   Body Fat % 53.8 % 50.1 %  Muscle Mass (lbs) 133.8 lbs 142.6 lbs  Fat Mass (lbs) 163.8 lbs 151 lbs  Total Body Water  (lbs) 95.8 lbs 95.2 lbs  Visceral Fat Rating  20 18    Counseling done on how various foods will affect these numbers and how to maximize success   Total lbs lost to date: - 14 lbs Total Fat Mass in lbs lost to date: - 24.6 lbs Total weight loss percentage to date: - 4.44 %    Morbid obesity (HCC) - start BMI 49.32 BMI 45.0-49.9, adult (HCC) current 47.13  Nutrition Therapy She is on the Category 2 Plan and states she is following her eating plan approximately 80 % of the time.   - Tracking Calories/Macros: no  - Eating More Whole Foods: yes  - Adequate Protein Intake: yes  - Adequate Water  Intake: no   - Skipping Meals: no   - Sleeping 7-9 Hours/ Night: yes   Shannon Roth is currently in the action stage of change. As such, her goal is to continue weight management plan.  She has agreed to: continue current plan   Physical Activity Shannon Roth is not exercising due to knee injury   Shannon Roth has been advised to work up to 300-450 minutes of moderate intensity aerobic activity a week and strengthening exercises 2-3 times per week for cardiovascular health, weight loss maintenance  and preservation of muscle mass.  She has agreed to : Unable to participate in physical activity at present due to medical conditions    Behavioral Modifications Evidence-based interventions for health behavior change were utilized today including the discussion of  1) self monitoring techniques:    - Be determined  2) problem-solving barriers:    - Continue focusing on protein intake  3) self care:    - Continue practicing 4-7-8 breathing   4) SMART goals for next OV:    - Eat all foods   Regarding patient's less desirable eating habits and patterns, we employed the technique of small changes.   We discussed the following today: reading food labels , work on managing stress, creating time for self-care and relaxation, avoiding temptations and identifying enticing environmental cues, and continue to work on implementation of reduced calorie nutritional plan Additional resources provided today: Handout on protein equivalents of 2 ounces of meat or seafood and Physician provided patient with handouts and personalized instruction on tracking and journaling using Apps (or how to handwrite in notebook) and using logs provided    Medical Interventions/ Pharmacotherapy Previous Bariatric surgery: n/a Pharmacotherapy for weight loss: She is not currently taking medications  for medical weight loss.    We discussed various medication options to help Shannon Roth  with her weight loss efforts and we both agreed to : Continue with current nutritional and behavioral strategies   B) OBESITY RELATED CONDITIONS ADDRESSED TODAY:  Insulin  resistance Assessment & Plan Lab Results  Component Value Date   HGBA1C 5.4 12/01/2023   HGBA1C 5.3 12/23/2022   HGBA1C 5.3 05/13/2022   INSULIN  12.3 01/03/2024    Diet and lifestyle controlled. Patient states that her hunger and cravings are well controlled. She endorses focusing on her protein because she is aware that if she does not eat enough protein she will  be hungry later on and have cravings. Extensive review on how to eat out and still get all protein in and avoid high calorie meals. Patient asked about benefits of Metformin and explained to patient how it helps decrease excessive hunger and cravings. Mutually agreed to Shannon Roth Metformin 500 mg daily begin with half a tab for a couple of days and then increase to full tablet. Explained to patient the benefits and risk. Patient verbally agreed. Continue following prudent meal plan and decreasing simple carbs and sugar.     Essential hypertension Assessment & Plan BP Readings from Last 3 Encounters:  04/11/24 137/77  03/27/24 128/89  03/08/24 124/77   The 10-year ASCVD risk score (Arnett DK, et al., 2019) is: 8.3%  Lab Results  Component Value Date   CREATININE 0.90 12/01/2023   Taking Spironolactone  100 mg daily and Benicar  20 mg daily. Good compliance and tolerance. Patients systolic today is slightly elevated but no acute concerns today. Continue with medications and following prudent meal plans and decreasing foods high in sodium.     Metabolic dysfunction-associated steatotic liver disease (MASLD) Assessment & Plan Hepatic Function Panel     Component Value Date/Time   PROT 7.1 12/01/2023 0911   ALBUMIN 4.2 12/01/2023 0911   AST 16 12/01/2023 0911   ALT 15 12/01/2023 0911   ALKPHOS 93 12/01/2023 0911   BILITOT 0.3 12/01/2023 0911   BILIDIR 0.1 12/03/2017 0727   IBILI 0.4 12/03/2017 0727   Current visceral fat rating: 18.  The visceral fat rating should be < 12 in a female. Visceral adipose tissue is a hormonally active component of total body fat. This body composition phenotype is associated with medical disorders such as metabolic syndrome, cardiovascular disease and several malignancies including prostate, breast, and colorectal cancers. Lose 7-10% of weight via prudent nutritional plan and lifestyle changes. Avoid excessive NSAID usage. Will obtain labs today.      Vitamin  D deficiency Assessment & Plan Lab Results  Component Value Date   VD25OH 17.6 (L) 01/03/2024   VD25OH 31.6 08/03/2023   VD25OH 18.7 (L) 12/23/2022   Patient was previously taking ERGO 50 K units twice weekly. Patient states that she has not been taking her Vit D supplementation since September since she ran out. Will refil ERGO today and encouraged patient to restart supplementation. Will obtain labs in the near future.      Primary osteoarthritis of left knee Assessment & Plan Patient states that she was previously prescribed Tylenol  650 mg prn. She endorses that she has run out and is taking Ibuprofen  twice daily. Advised patient with taking Metformin that regular use of Ibuprofen  can cause elevation of her creatinine levels and cause extra strain on her kidneys. Reccommended that patient take Tylenol  for pain as needed. Patient request a refill. Will refill today. Will continue monitoring.    Medications Discontinued During This Encounter  Medication Reason   Vitamin D , Ergocalciferol , (DRISDOL ) 1.25  MG (50000 UNIT) CAPS capsule Reorder     Meds ordered this encounter  Medications   Vitamin D , Ergocalciferol , (DRISDOL ) 1.25 MG (50000 UNIT) CAPS capsule    Sig: 1 tab q sun and 1 q wed    Dispense:  24 capsule    Refill:  0   metFORMIN (GLUCOPHAGE) 500 MG tablet    Sig: 1 po with lunch daily    Dispense:  30 tablet    Refill:  0    30 d supply;  ** OV for RF **   Do not send RF request      Follow up:   Return 05/11/2024 at 9:20 AM  She was informed of the importance of frequent follow up visits to maximize her success with intensive lifestyle modifications for her multiple health conditions.   Weight Summary and Biometrics   Weight Lost Since Last Visit: 3lb  Weight Gained Since Last Visit: 0   Vitals Temp: 98.2 F (36.8 C) BP: 137/77 Pulse Rate: 63 SpO2: 99 %   Anthropometric Measurements Height: 5' 7 (1.702 m) Weight: (!) 301 lb (136.5 kg) BMI  (Calculated): 47.13 Weight at Last Visit: 304lb Weight Lost Since Last Visit: 3lb Weight Gained Since Last Visit: 0 Starting Weight: 315lb Total Weight Loss (lbs): 14 lb (6.35 kg) Peak Weight: 327lb   Body Composition  Body Fat %: 50.1 % Fat Mass (lbs): 151 lbs Muscle Mass (lbs): 142.6 lbs Total Body Water  (lbs): 95.2 lbs Visceral Fat Rating : 18   Other Clinical Data Fasting: no Labs: no Today's Visit #: 5 Starting Date: 01/03/24    Objective:   PHYSICAL EXAM: Blood pressure 137/77, pulse 63, temperature 98.2 F (36.8 C), height 5' 7 (1.702 m), weight (!) 301 lb (136.5 kg), SpO2 99%. Body mass index is 47.14 kg/m.  General: she is overweight, cooperative and in no acute distress. PSYCH: Has normal mood, affect and thought process.   HEENT: EOMI, sclerae are anicteric. Lungs: Normal breathing effort, no conversational dyspnea. Extremities: Moves * 4 Neurologic: A and O * 3, good insight  DIAGNOSTIC DATA REVIEWED: BMET    Component Value Date/Time   NA 141 12/01/2023 0911   K 4.2 12/01/2023 0911   CL 103 12/01/2023 0911   CO2 21 12/01/2023 0911   GLUCOSE 80 12/01/2023 0911   GLUCOSE 101 (H) 10/18/2021 0828   BUN 13 12/01/2023 0911   CREATININE 0.90 12/01/2023 0911   CALCIUM 9.6 12/01/2023 0911   GFRNONAA >60 10/18/2021 0828   GFRAA 103 01/08/2020 0919   Lab Results  Component Value Date   HGBA1C 5.4 12/01/2023   HGBA1C 5.6 01/08/2020   Lab Results  Component Value Date   INSULIN  12.3 01/03/2024   Lab Results  Component Value Date   TSH 1.250 12/01/2023   CBC    Component Value Date/Time   WBC 8.2 12/01/2023 0911   WBC 6.5 10/18/2021 0828   RBC 4.38 12/01/2023 0911   RBC 4.75 10/18/2021 0828   HGB 10.1 (L) 12/01/2023 0911   HCT 35.7 12/01/2023 0911   PLT 325 12/01/2023 0911   MCV 82 12/01/2023 0911   MCH 23.1 (L) 12/01/2023 0911   MCH 23.4 (L) 10/18/2021 0828   MCHC 28.3 (L) 12/01/2023 0911   MCHC 30.0 10/18/2021 0828   RDW 12.9  12/01/2023 0911   Iron Studies    Component Value Date/Time   IRON 65 12/01/2023 0911   TIBC 244 (L) 12/03/2017 0727   FERRITIN 467 (H) 01/08/2020 0919  IRONPCTSAT 30 12/03/2017 0727   Lipid Panel     Component Value Date/Time   CHOL 180 12/01/2023 0911   TRIG 100 12/01/2023 0911   HDL 43 12/01/2023 0911   CHOLHDL 4.2 12/01/2023 0911   CHOLHDL 3.7 Ratio 03/09/2009 1850   VLDL 18 03/09/2009 1850   LDLCALC 119 (H) 12/01/2023 0911   Hepatic Function Panel     Component Value Date/Time   PROT 7.1 12/01/2023 0911   ALBUMIN 4.2 12/01/2023 0911   AST 16 12/01/2023 0911   ALT 15 12/01/2023 0911   ALKPHOS 93 12/01/2023 0911   BILITOT 0.3 12/01/2023 0911   BILIDIR 0.1 12/03/2017 0727   IBILI 0.4 12/03/2017 0727      Component Value Date/Time   TSH 1.250 12/01/2023 0911   Nutritional Lab Results  Component Value Date   VD25OH 17.6 (L) 01/03/2024   VD25OH 31.6 08/03/2023   VD25OH 18.7 (L) 12/23/2022    Attestations:   LILLETTE Sonny Laroche, acting as a stage manager for Barnie Jenkins, DO., have compiled all relevant documentation for today's office visit on behalf of Barnie Jenkins, DO, while in the presence of [employer], DO.  I have reviewed the above documentation for accuracy and completeness, and I agree with the above. Barnie JINNY Jenkins, D.O.  The 21st Century Cures Act was signed into law in 2016 which includes the topic of electronic health records.  This provides immediate access to information in MyChart.  This includes consultation notes, operative notes, office notes, lab results and pathology reports.  If you have any questions about what you read please let us  know at your next visit so we can discuss your concerns and take corrective action if need be.  We are right here with you.

## 2024-04-12 LAB — BASIC METABOLIC PANEL WITH GFR
BUN/Creatinine Ratio: 19 (ref 9–23)
BUN: 19 mg/dL (ref 6–24)
CO2: 22 mmol/L (ref 20–29)
Calcium: 9.5 mg/dL (ref 8.7–10.2)
Chloride: 102 mmol/L (ref 96–106)
Creatinine, Ser: 0.98 mg/dL (ref 0.57–1.00)
Glucose: 77 mg/dL (ref 70–99)
Potassium: 4.1 mmol/L (ref 3.5–5.2)
Sodium: 140 mmol/L (ref 134–144)
eGFR: 66 mL/min/1.73 (ref >59)

## 2024-04-24 ENCOUNTER — Ambulatory Visit (INDEPENDENT_AMBULATORY_CARE_PROVIDER_SITE_OTHER): Admitting: Family Medicine

## 2024-04-24 ENCOUNTER — Encounter: Payer: Self-pay | Admitting: Family Medicine

## 2024-04-24 VITALS — BP 134/83 | HR 80 | Resp 16 | Ht 68.0 in | Wt 301.0 lb

## 2024-04-24 DIAGNOSIS — E785 Hyperlipidemia, unspecified: Secondary | ICD-10-CM | POA: Diagnosis not present

## 2024-04-24 DIAGNOSIS — Z0001 Encounter for general adult medical examination with abnormal findings: Secondary | ICD-10-CM | POA: Diagnosis not present

## 2024-04-24 DIAGNOSIS — N898 Other specified noninflammatory disorders of vagina: Secondary | ICD-10-CM | POA: Diagnosis not present

## 2024-04-24 DIAGNOSIS — I10 Essential (primary) hypertension: Secondary | ICD-10-CM | POA: Diagnosis not present

## 2024-04-24 MED ORDER — SPIRONOLACTONE 100 MG PO TABS: 100.0000 mg | ORAL_TABLET | Freq: Every day | ORAL | 3 refills | Status: AC

## 2024-04-24 MED ORDER — OLMESARTAN MEDOXOMIL 20 MG PO TABS: 20.0000 mg | ORAL_TABLET | Freq: Every day | ORAL | 3 refills | Status: AC

## 2024-04-24 MED ORDER — METRONIDAZOLE 1 % EX GEL: Freq: Every day | CUTANEOUS | 0 refills | Status: AC

## 2024-04-24 NOTE — Assessment & Plan Note (Signed)

## 2024-04-24 NOTE — Progress Notes (Signed)
    LALA BEEN     MRN: [id number]      DOB: 1965-11-12  Chief Complaint  Patient presents with   Annual Exam    cpe    HPI: Patient is in for annual physical exam. C/o intermittent abnormal vaginal odor, not  sexually active has been treated for BV in the past and prefers to use the gel Recent labs,  are reviewed. Excellent weight loss since starting at clinic despite being on n o medication Immunization is reviewed , and  feels she is uTD on recommended vaccines  PE: BP 134/83   Pulse 80   Resp 16   Ht 5' 8 (1.727 m)   Wt (!) 301 lb (136.5 kg)   SpO2 96%   BMI 45.77 kg/m   Pleasant  female, alert and oriented x 3, in no cardio-pulmonary distress. Afebrile. HEENT No facial trauma or asymetry. Sinuses non tender.  Extra occullar muscles intact.. External ears normal, . Neck: supple, no adenopathy,JVD or thyromegaly.No bruits.  Chest: Clear to ascultation bilaterally.No crackles or wheezes. Non tender to palpation  Breast: Asymptomatic, not examined, mammogram uTD and normal  Cardiovascular system; Heart sounds normal,  S1 and  S2 ,no S3.  No murmur, or thrill. Apical beat not displaced Peripheral pulses normal.  Abdomen: Soft, non tender, no organomegaly or masses.   GU: Not examined, metrogel  prescribed based on history , will call in if symptoms doe not respond  to metrogel  as expected\  Musculoskeletal exam: Full ROM of spine, hips , shoulders and  reduced in knees.  deformity ,swelling and  crepitus noted.in knees No muscle wasting or atrophy.   Neurologic: Cranial nerves 2 to 12 intact. Power, tone ,sensation and reflexes normal throughout. No disturbance in gait. No tremor.  Skin: Intact, no ulceration, erythema , scaling or rash noted. Pigmentation normal throughout  Psych; Normal mood and affect. Judgement and concentration normal   Assessment & Plan:  Annual visit for general adult medical examination with abnormal findings Annual  exam as documented. Counseling done  re healthy lifestyle involving commitment to 150 minutes exercise per week, heart healthy diet, and attaining healthy weight.The importance of adequate sleep also discussed. Regular seat belt use and home safety, is also discussed. Changes in health habits are decided on by the patient with goals and time frames  set for achieving them. Immunization and cancer screening needs are specifically addressed at this visit.   Vaginal odor Chronic and intermittent, metrogel  prescribed , will send self collected specimen if persists or worsens

## 2024-04-24 NOTE — Assessment & Plan Note (Signed)
 Chronic and intermittent, metrogel  prescribed , will send self collected specimen if persists or worsens

## 2024-04-24 NOTE — Patient Instructions (Addendum)
 F/U in 18 to 20  weeks  Pls consider hep B, pneumonia and Covid vaccines  Fasting lipid, bmp and EGFr 1 week before next appointment  Congrats on weight loss , keep it up!  Medtrogel is prescribed and as we discussed , should symptom persist or worsen , pls call or send a message , specimen will be asent for testing at that time and you wuill be able to self collect the sopecimen   Thanks for choosing [facility], we consider it a privelige to serve you.

## 2024-04-28 ENCOUNTER — Other Ambulatory Visit: Payer: Self-pay | Admitting: Family Medicine

## 2024-04-28 DIAGNOSIS — M1712 Unilateral primary osteoarthritis, left knee: Secondary | ICD-10-CM

## 2024-05-11 ENCOUNTER — Encounter (INDEPENDENT_AMBULATORY_CARE_PROVIDER_SITE_OTHER): Payer: Self-pay | Admitting: Family Medicine

## 2024-05-11 ENCOUNTER — Ambulatory Visit (INDEPENDENT_AMBULATORY_CARE_PROVIDER_SITE_OTHER): Admitting: Family Medicine

## 2024-05-11 DIAGNOSIS — I10 Essential (primary) hypertension: Secondary | ICD-10-CM

## 2024-05-11 DIAGNOSIS — Z6841 Body Mass Index (BMI) 40.0 and over, adult: Secondary | ICD-10-CM

## 2024-05-11 DIAGNOSIS — E559 Vitamin D deficiency, unspecified: Secondary | ICD-10-CM

## 2024-05-11 DIAGNOSIS — E88819 Insulin resistance, unspecified: Secondary | ICD-10-CM

## 2024-05-11 MED ORDER — METFORMIN HCL ER 500 MG PO TB24: 500.0000 mg | ORAL_TABLET | Freq: Every day | ORAL | 1 refills | Status: DC

## 2024-05-11 NOTE — Progress Notes (Signed)
 Shannon Roth, D.O.  ABFM, ABOM Specializing in Clinical Bariatric Medicine  Office located at: [address] [place], [place]  [address]      A) FOR THE CHRONIC DISEASE OF OBESITY:  Chief complaint: Obesity Shannon Roth is here to discuss her progress with her obesity treatment plan.   History of present illness / Interval history:  Shannon Roth is here today for her follow-up office visit.  Since last OV on 04/11/24, pt is up 3 lbs. Patient states that she did not eat a lot but since she was spending more time at home and instead of cooking her breakfast she was eating leftovers. She denies measuring her food.       04/11/24 10:00 05/11/24   Body Fat % 50.1 % 51.7 %  Muscle Mass (lbs) 142.6 lbs 139.6 lbs  Fat Mass (lbs) 151 lbs 157.2 lbs  Total Body Water  (lbs) 95.2 lbs 97.2 lbs  Visceral Fat Rating  18 19    Counseling done on how various foods will affect these numbers and how to maximize success.  Total lbs lost to date: - 11 lbs Total Fat Mass in lbs lost to date: - 18.4 lbs Total weight loss percentage to date: - 3.49 %    Morbid obesity (HCC) - start BMI 49.32 BMI 45.0-49.9, adult (HCC) current 47.6  Nutrition Therapy She is on the Category 2 Plan and states she is following her eating plan approximately 95 % of the time.   - Tracking Calories/Macros: no   - Eating More Whole Foods: yes  - Adequate Protein Intake: yes  - Adequate Water  Intake: yes  - Skipping Meals: no   - Sleeping 7-9 Hours/ Night: yes   [name] is currently in the action stage of change. As such, her goal is to continue weight management plan.  She has agreed to: continue current plan   Physical Activity Lori is not exercising.   [name] has been advised to work up to 300-450 minutes of moderate intensity aerobic activity a week and strengthening exercises 2-3 times per week for cardiovascular health, weight loss maintenance and preservation of muscle mass.  She has  agreed to : Think about enjoyable ways to increase daily physical activity and overcoming barriers to exercise   Behavioral Modifications Evidence-based interventions for health behavior change were utilized today including the discussion of  1) self monitoring techniques:    - weigh protein and measure lean proteins   2) SMART goals for next OV:    - Eat all your protein   - During holidays maintain weight  Regarding patient's less desirable eating habits and patterns, we employed the technique of small changes.   We discussed the following today: increasing lean protein intake to established goals and continue to work on implementation of reduced calorie nutritional plan Additional resources provided today: None   Medical Interventions/ Pharmacotherapy Previous Bariatric surgery: n/a Pharmacotherapy for weight loss: She is currently taking Metformin  500 mg once daily  for medical weight loss.    We discussed various medication options to help Shirlean with her weight loss efforts and we both agreed to : Continue with current nutritional and behavioral strategies   B) OBESITY RELATED CONDITIONS ADDRESSED TODAY:  Insulin  resistance Assessment & Plan Lab Results  Component Value Date   HGBA1C 5.4 12/01/2023   HGBA1C 5.3 12/23/2022   HGBA1C 5.3 05/13/2022   INSULIN  12.3 01/03/2024    On Metformin  500 mg. Patient states that she has forgotten  to take her Metformin  about 12 days in the past. She reports putting her Metformin  in her purse so she can have it on her. Patient reports having a headache all week. She does not think that it is a side effect from the Metformin . She has had a headache every day this week. Explained to patient that as she increases her her protein she also needs to increase her water  intake to prevent headaches. She states that since she has been inconsistent she has not noticed a difference in her hunger and cravings. Since patient has not been consistently  taking her medication due to forgetfulness and no change in her cravings mutually agreed to Switch to Metformin  XR once daily. Will reassess at next OV. Continue following prudent meal plan and decreasing simple carbs and sugars.     Essential hypertension Assessment & Plan BP Readings from Last 3 Encounters:  05/11/24 (!) 140/62  04/24/24 134/83  04/11/24 137/77   The 10-year ASCVD risk score (Arnett DK, et al., 2019) is: 8.9%  Lab Results  Component Value Date   CREATININE 0.98 04/11/2024   On Spironolactone  100 mg daily and Benicar  20 mg daily. With reported good compliance and tolerance. BP today is not at goal - elevated. Recommended that patient check her BP at home a couple of times a week.     Vitamin D  deficiency Assessment & Plan Lab Results  Component Value Date   VD25OH 17.6 (L) 01/03/2024   VD25OH 31.6 08/03/2023   VD25OH 18.7 (L) 12/23/2022   On ERGO 50K units twice weekly. With reported good compliance and tolerance. No acute concerns today. Continue with supplementation.        Medications Discontinued During This Encounter  Medication Reason   metFORMIN  (GLUCOPHAGE ) 500 MG tablet Dose change     Meds ordered this encounter  Medications   metFORMIN  (GLUCOPHAGE -XR) 500 MG 24 hr tablet    Sig: Take 1 tablet (500 mg total) by mouth daily with breakfast.    Dispense:  30 tablet    Refill:  1      Follow up:   Return 06/12/2024 at 8:20 AM  She was informed of the importance of frequent follow up visits to maximize her success with intensive lifestyle modifications for her multiple health conditions.   Weight Summary and Biometrics   Weight Lost Since Last Visit: 0lb  Weight Gained Since Last Visit: 3lb   Vitals Temp: 97.9 F (36.6 C) BP: (!) 90/49 Pulse Rate: (!) 59 SpO2: 100 %   Anthropometric Measurements Height: 5' 7 (1.702 m) Weight: (!) 304 lb (137.9 kg) BMI (Calculated): 47.6 Weight at Last Visit: 301lb Weight Lost Since Last  Visit: 0lb Weight Gained Since Last Visit: 3lb Starting Weight: 315lb Total Weight Loss (lbs): 11 lb (4.99 kg) Peak Weight: 327lb   Body Composition  Body Fat %: 51.7 % Fat Mass (lbs): 157.2 lbs Muscle Mass (lbs): 139.6 lbs Total Body Water  (lbs): 97.2 lbs Visceral Fat Rating : 19   Other Clinical Data Fasting: no Labs: no Today's Visit #: 6 Starting Date: 01/03/24    Objective:   PHYSICAL EXAM: Blood pressure (!) 90/49, pulse (!) 59, temperature 97.9 F (36.6 C), height 5' 7 (1.702 m), weight (!) 304 lb (137.9 kg), SpO2 100%. Body mass index is 47.61 kg/m.  General: she is overweight, cooperative and in no acute distress. PSYCH: Has normal mood, affect and thought process.   HEENT: EOMI, sclerae are anicteric. Lungs: Normal breathing effort, no conversational  dyspnea. Extremities: Moves * 4 Neurologic: A and O * 3, good insight  DIAGNOSTIC DATA REVIEWED: BMET    Component Value Date/Time   NA 140 04/11/2024 1257   K 4.1 04/11/2024 1257   CL 102 04/11/2024 1257   CO2 22 04/11/2024 1257   GLUCOSE 77 04/11/2024 1257   GLUCOSE 101 (H) 10/18/2021 0828   BUN 19 04/11/2024 1257   CREATININE 0.98 04/11/2024 1257   CALCIUM 9.5 04/11/2024 1257   GFRNONAA >60 10/18/2021 0828   GFRAA 103 01/08/2020 0919   Lab Results  Component Value Date   HGBA1C 5.4 12/01/2023   HGBA1C 5.6 01/08/2020   Lab Results  Component Value Date   INSULIN  12.3 01/03/2024   Lab Results  Component Value Date   TSH 1.250 12/01/2023   CBC    Component Value Date/Time   WBC 8.2 12/01/2023 0911   WBC 6.5 10/18/2021 0828   RBC 4.38 12/01/2023 0911   RBC 4.75 10/18/2021 0828   HGB 10.1 (L) 12/01/2023 0911   HCT 35.7 12/01/2023 0911   PLT 325 12/01/2023 0911   MCV 82 12/01/2023 0911   MCH 23.1 (L) 12/01/2023 0911   MCH 23.4 (L) 10/18/2021 0828   MCHC 28.3 (L) 12/01/2023 0911   MCHC 30.0 10/18/2021 0828   RDW 12.9 12/01/2023 0911   Iron Studies    Component Value Date/Time    IRON 65 12/01/2023 0911   TIBC 244 (L) 12/03/2017 0727   FERRITIN 467 (H) 01/08/2020 0919   IRONPCTSAT 30 12/03/2017 0727   Lipid Panel     Component Value Date/Time   CHOL 180 12/01/2023 0911   TRIG 100 12/01/2023 0911   HDL 43 12/01/2023 0911   CHOLHDL 4.2 12/01/2023 0911   CHOLHDL 3.7 Ratio 03/09/2009 1850   VLDL 18 03/09/2009 1850   LDLCALC 119 (H) 12/01/2023 0911   Hepatic Function Panel     Component Value Date/Time   PROT 7.1 12/01/2023 0911   ALBUMIN 4.2 12/01/2023 0911   AST 16 12/01/2023 0911   ALT 15 12/01/2023 0911   ALKPHOS 93 12/01/2023 0911   BILITOT 0.3 12/01/2023 0911   BILIDIR 0.1 12/03/2017 0727   IBILI 0.4 12/03/2017 0727      Component Value Date/Time   TSH 1.250 12/01/2023 0911   Nutritional Lab Results  Component Value Date   VD25OH 17.6 (L) 01/03/2024   VD25OH 31.6 08/03/2023   VD25OH 18.7 (L) 12/23/2022    Attestations:   LILLETTE Sonny Laroche, acting as a stage manager for Shannon Jenkins, DO., have compiled all relevant documentation for today's office visit on behalf of Shannon Jenkins, DO, while in the presence of [employer], DO.   I have reviewed the above documentation for accuracy and completeness, and I agree with the above. Shannon JINNY Roth, D.O.  The 21st Century Cures Act was signed into law in 2016 which includes the topic of electronic health records.  This provides immediate access to information in MyChart.  This includes consultation notes, operative notes, office notes, lab results and pathology reports.  If you have any questions about what you read please let us  know at your next visit so we can discuss your concerns and take corrective action if need be.  We are right here with you.

## 2024-05-17 ENCOUNTER — Other Ambulatory Visit: Payer: Self-pay | Admitting: Family Medicine

## 2024-05-17 ENCOUNTER — Encounter: Admitting: Family Medicine

## 2024-05-17 DIAGNOSIS — M1712 Unilateral primary osteoarthritis, left knee: Secondary | ICD-10-CM

## 2024-05-22 ENCOUNTER — Telehealth: Payer: Self-pay

## 2024-05-22 NOTE — Telephone Encounter (Signed)
 Copied from CRM #[id number]. Topic: Clinical - Medication Question >> May 22, 2024  8:13 AM Deaijah H wrote: Reason for CRM: Patient called in stating she needs Dr. Antonetta to write another prescription ibuprofen  (ADVIL ) 800 MG tablet the way the insurance told her to write it. Stated she take 2 800 mg twice a day so need to write it as a 30 day supply.

## 2024-05-22 NOTE — Telephone Encounter (Signed)
 Patient asking needs to read for 60 pills (takes 2 a day) for a 30 day supply.

## 2024-05-23 MED ORDER — IBUPROFEN 800 MG PO TABS: ORAL_TABLET | ORAL | 3 refills | Status: AC

## 2024-05-23 NOTE — Telephone Encounter (Signed)
 Prescription sent for twice daily as needed

## 2024-05-23 NOTE — Addendum Note (Signed)
 Addended by: [name] on: 05/23/2024 09:53 AM   Modules accepted: Orders

## 2024-06-12 ENCOUNTER — Encounter (INDEPENDENT_AMBULATORY_CARE_PROVIDER_SITE_OTHER): Payer: Self-pay | Admitting: Family Medicine

## 2024-06-12 ENCOUNTER — Ambulatory Visit (INDEPENDENT_AMBULATORY_CARE_PROVIDER_SITE_OTHER): Admitting: Family Medicine

## 2024-06-12 DIAGNOSIS — Z6841 Body Mass Index (BMI) 40.0 and over, adult: Secondary | ICD-10-CM

## 2024-06-12 DIAGNOSIS — E88819 Insulin resistance, unspecified: Secondary | ICD-10-CM

## 2024-06-12 DIAGNOSIS — I10 Essential (primary) hypertension: Secondary | ICD-10-CM | POA: Diagnosis not present

## 2024-06-12 DIAGNOSIS — E559 Vitamin D deficiency, unspecified: Secondary | ICD-10-CM | POA: Diagnosis not present

## 2024-06-12 MED ORDER — METFORMIN HCL ER 500 MG PO TB24: 1000.0000 mg | ORAL_TABLET | Freq: Every day | ORAL | 1 refills | Status: AC

## 2024-06-12 NOTE — Progress Notes (Signed)
 "  [name], D.O.  ABFM, ABOM Specializing in Clinical Bariatric Medicine  Office located at: [address] [place], [place]  [address]      A) FOR THE CHRONIC DISEASE OF OBESITY:  Morbid obesity (HCC) - start BMI 49.32 BMI 45.0-49.9, adult (HCC) current 47.76  Chief complaint: Obesity Shannon Roth is here to discuss her progress with her obesity treatment plan.   History of present illness / Interval history:  Shannon Roth is here today for her follow-up office visit.  Since last OV on 05/11/2024, pt is up 1 lb.   Reports she ate on-plan foods over the holidays.    05/11/24 06/12/24 08:00   Body Fat % 51.7 % 53 %  Muscle Mass (lbs) 139.6 lbs 136.4 lbs  Fat Mass (lbs) 157.2 lbs 161.8 lbs  Total Body Water  (lbs) 97.2 lbs 96.6 lbs  Visceral Fat Rating  19 20  Counseling done on how various foods will affect these numbers and how to maximize success   Total lbs lost to date: -10 lbs Total Fat Mass in lbs lost to date: -13.8 Total weight loss percentage to date: -3.17 %   Nutrition Therapy She is on the Category 2 Plan and states she is following her eating plan approximately 100 % of the time.   - Tracking Calories/Macros: no - ***  - Eating More Whole Foods: yes  - Adequate Protein Intake: yes  - Adequate Water  Intake: no - ***  - Skipping Meals: no - ***  - Sleeping 7-9 Hours/ Night: no - ***   Libbie is currently in the action stage of change. As such, her goal is to continue weight management plan.  She has agreed to: continue current plan   Physical Activity Pt is not exercising due to bad knees   Azyiah has been advised to work up to 300-450 minutes of moderate intensity aerobic activity a week and strengthening exercises 2-3 times per week for cardiovascular health, weight loss maintenance and preservation of muscle mass.  She has agreed to : Think about enjoyable ways to increase daily physical activity and overcoming barriers to exercise and  Increase physical activity in their day and reduce sedentary time (increase NEAT).[facility] therapy options.   Behavioral Modifications Evidence-based interventions for health behavior change were utilized today including the discussion of  1) self monitoring techniques:  weigh protein 2) problem-solving barriers:  bad knees that prevent her from exercising 3) self care:  exercise 4) SMART goals for next OV:  Increase protein, eat 3 meals a day, and move more.  Regarding patient's less desirable eating habits and patterns, we employed the technique of small changes.   We discussed the following today: increasing lean protein intake to established goals, decreasing simple carbohydrates , increasing vegetables, avoiding skipping meals, increasing water  intake , and continue to work on implementation of reduced calorie nutritional plan Additional resources provided today: None   Medical Interventions/ Pharmacotherapy Previous Bariatric surgery: none Pharmacotherapy for weight loss: She is currently taking Metformin -XR 500 mg daily for medical weight loss.    We discussed various medication options to help [name] with her weight loss efforts and we both agreed to : See IR note   B) OBESITY RELATED CONDITIONS ADDRESSED TODAY:  Insulin  resistance Assessment & Plan Lab Results  Component Value Date   HGBA1C 5.4 12/01/2023   HGBA1C 5.3 12/23/2022   HGBA1C 5.3 05/13/2022   INSULIN  12.3 01/03/2024  Currently on Metformin -XR 500 mg once daily with  good tolerance. Reports she missed her dose about 4 times. She noticed when she takes medication after 2/3 pm, she tends to stay awake later. Encouraged to take medication in the morning with breakfast to help reduce those symptoms. Pt reports she feels no difference in hunger and cravings on the medication. Increase Metformin -XR 500 mg from once daily to twice daily.  Cont decreasing simple carbs/sugars and increasing lean proteins.     Essential  hypertension Assessment & Plan BP Readings from Last 3 Encounters:  06/12/24 (!) 121/56  05/11/24 (!) 140/62  04/24/24 134/83   The 10-year ASCVD risk score (Arnett DK, et al., 2019) is: 5.8%  Lab Results  Component Value Date   CREATININE 0.98 04/11/2024  Currently on Benicar  20 mg once daily and Aldactone  100 mg once daily with good compliance and tolerance. BP is well controlled at 121/56. Pt asx. No acute concerns today. Encouraged to check BP at home. Cont adherence to medication. Cont low-sodium, heart-healthy diet and increase water  intake and exercise.     Vitamin D  deficiency Assessment & Plan Lab Results  Component Value Date   VD25OH 17.6 (L) 01/03/2024   VD25OH 31.6 08/03/2023   VD25OH 18.7 (L) 12/23/2022  Currently on Ergo 50K units twice weekly with good compliance and tolerance. Vit D levels below goal at 17.6; optimal between 50-70. Cont adherence to medication. Will recheck levels as necessary.     Medications Discontinued During This Encounter  Medication Reason   metFORMIN  (GLUCOPHAGE -XR) 500 MG 24 hr tablet Reorder     Meds ordered this encounter  Medications   metFORMIN  (GLUCOPHAGE -XR) 500 MG 24 hr tablet    Sig: Take 2 tablets (1,000 mg total) by mouth daily with breakfast.    Dispense:  60 tablet    Refill:  1      Follow up:   Return 07/10/2024 8:40 AM.  She was informed of the importance of frequent follow up visits to maximize her success with intensive lifestyle modifications for her multiple health conditions.   Weight Summary and Biometrics   Weight Lost Since Last Visit: 0lb  Weight Gained Since Last Visit: 1lb    Vitals Temp: 97.6 F (36.4 C) BP: (!) 121/56 Pulse Rate: (!) 54 SpO2: 100 %   Anthropometric Measurements Height: 5' 7 (1.702 m) Weight: (!) 305 lb (138.3 kg) BMI (Calculated): 47.76 Weight at Last Visit: 304lb Weight Lost Since Last Visit: 0lb Weight Gained Since Last Visit: 1lb Starting Weight: 315lb Total  Weight Loss (lbs): 10 lb (4.536 kg) Peak Weight: 327lb   Body Composition  Body Fat %: 53 % Fat Mass (lbs): 161.8 lbs Muscle Mass (lbs): 136.4 lbs Total Body Water  (lbs): 96.6 lbs Visceral Fat Rating : 20   Other Clinical Data Fasting: yes Labs: no Today's Visit #: 7 Starting Date: 01/03/24    Objective:   PHYSICAL EXAM: Blood pressure (!) 121/56, pulse (!) 54, temperature 97.6 F (36.4 C), height 5' 7 (1.702 m), weight (!) 305 lb (138.3 kg), SpO2 100%. Body mass index is 47.77 kg/m.  General: she is overweight, cooperative and in no acute distress. PSYCH: Has normal mood, affect and thought process.   HEENT: EOMI, sclerae are anicteric. Lungs: Normal breathing effort, no conversational dyspnea. Extremities: Moves * 4 Neurologic: A and O * 3, good insight  DIAGNOSTIC DATA REVIEWED: BMET    Component Value Date/Time   NA 140 04/11/2024 1257   K 4.1 04/11/2024 1257   CL 102 04/11/2024 1257   CO2  22 04/11/2024 1257   GLUCOSE 77 04/11/2024 1257   GLUCOSE 101 (H) 10/18/2021 0828   BUN 19 04/11/2024 1257   CREATININE 0.98 04/11/2024 1257   CALCIUM 9.5 04/11/2024 1257   GFRNONAA >60 10/18/2021 0828   GFRAA 103 01/08/2020 0919   Lab Results  Component Value Date   HGBA1C 5.4 12/01/2023   HGBA1C 5.6 01/08/2020   Lab Results  Component Value Date   INSULIN  12.3 01/03/2024   Lab Results  Component Value Date   TSH 1.250 12/01/2023   CBC    Component Value Date/Time   WBC 8.2 12/01/2023 0911   WBC 6.5 10/18/2021 0828   RBC 4.38 12/01/2023 0911   RBC 4.75 10/18/2021 0828   HGB 10.1 (L) 12/01/2023 0911   HCT 35.7 12/01/2023 0911   PLT 325 12/01/2023 0911   MCV 82 12/01/2023 0911   MCH 23.1 (L) 12/01/2023 0911   MCH 23.4 (L) 10/18/2021 0828   MCHC 28.3 (L) 12/01/2023 0911   MCHC 30.0 10/18/2021 0828   RDW 12.9 12/01/2023 0911   Iron Studies    Component Value Date/Time   IRON 65 12/01/2023 0911   TIBC 244 (L) 12/03/2017 0727   FERRITIN 467 (H)  01/08/2020 0919   IRONPCTSAT 30 12/03/2017 0727   Lipid Panel     Component Value Date/Time   CHOL 180 12/01/2023 0911   TRIG 100 12/01/2023 0911   HDL 43 12/01/2023 0911   CHOLHDL 4.2 12/01/2023 0911   CHOLHDL 3.7 Ratio 03/09/2009 1850   VLDL 18 03/09/2009 1850   LDLCALC 119 (H) 12/01/2023 0911   Hepatic Function Panel     Component Value Date/Time   PROT 7.1 12/01/2023 0911   ALBUMIN 4.2 12/01/2023 0911   AST 16 12/01/2023 0911   ALT 15 12/01/2023 0911   ALKPHOS 93 12/01/2023 0911   BILITOT 0.3 12/01/2023 0911   BILIDIR 0.1 12/03/2017 0727   IBILI 0.4 12/03/2017 0727      Component Value Date/Time   TSH 1.250 12/01/2023 0911   Nutritional Lab Results  Component Value Date   VD25OH 17.6 (L) 01/03/2024   VD25OH 31.6 08/03/2023   VD25OH 18.7 (L) 12/23/2022    Attestations:   LILLETTE Feliciano Mingle, acting as a stage manager for Barnie Jenkins, DO., have compiled all relevant documentation for today's office visit on behalf of Barnie Jenkins, DO, while in the presence of [employer], DO.   I have reviewed the above documentation for accuracy and completeness, and I agree with the above. Barnie JINNY Jenkins, D.O.  The 21st Century Cures Act was signed into law in 2016 which includes the topic of electronic health records.  This provides immediate access to information in MyChart.  This includes consultation notes, operative notes, office notes, lab results and pathology reports.  If you have any questions about what you read please let us  know at your next visit so we can discuss your concerns and take corrective action if need be.  We are right here with you.  "

## 2024-07-10 ENCOUNTER — Ambulatory Visit (INDEPENDENT_AMBULATORY_CARE_PROVIDER_SITE_OTHER): Admitting: Family Medicine

## 2024-07-13 ENCOUNTER — Ambulatory Visit (INDEPENDENT_AMBULATORY_CARE_PROVIDER_SITE_OTHER): Admitting: Family Medicine

## 2024-07-24 ENCOUNTER — Ambulatory Visit (INDEPENDENT_AMBULATORY_CARE_PROVIDER_SITE_OTHER): Admitting: Family Medicine

## 2024-08-29 ENCOUNTER — Ambulatory Visit: Admitting: Family Medicine
